# Patient Record
Sex: Male | Born: 1935 | Race: White | Hispanic: No | Smoking: Former smoker
Health system: Southern US, Community
[De-identification: ages and names within clinical notes are randomized; demographics above are authoritative.]

## PROBLEM LIST (undated history)

## (undated) DIAGNOSIS — Z951 Presence of aortocoronary bypass graft: Secondary | ICD-10-CM

## (undated) DIAGNOSIS — I82419 Acute embolism and thrombosis of unspecified femoral vein: Secondary | ICD-10-CM

## (undated) DIAGNOSIS — I200 Unstable angina: Secondary | ICD-10-CM

## (undated) DIAGNOSIS — E785 Hyperlipidemia, unspecified: Secondary | ICD-10-CM

## (undated) DIAGNOSIS — I2510 Atherosclerotic heart disease of native coronary artery without angina pectoris: Secondary | ICD-10-CM

## (undated) DIAGNOSIS — R9431 Abnormal electrocardiogram [ECG] [EKG]: Secondary | ICD-10-CM

## (undated) DIAGNOSIS — I10 Essential (primary) hypertension: Secondary | ICD-10-CM

## (undated) DIAGNOSIS — I4891 Unspecified atrial fibrillation: Secondary | ICD-10-CM

## (undated) DIAGNOSIS — I25110 Atherosclerotic heart disease of native coronary artery with unstable angina pectoris: Secondary | ICD-10-CM

## (undated) HISTORY — DX: Unstable angina: I20.0

## (undated) HISTORY — DX: Acute embolism and thrombosis of unspecified femoral vein: I82.419

## (undated) HISTORY — DX: Unspecified atrial fibrillation: I48.91

## (undated) HISTORY — DX: Atherosclerotic heart disease of native coronary artery with unstable angina pectoris: I25.110

## (undated) HISTORY — DX: Hyperlipidemia, unspecified: E78.5

## (undated) HISTORY — DX: Atherosclerotic heart disease of native coronary artery without angina pectoris: I25.10

## (undated) HISTORY — DX: Abnormal electrocardiogram (ECG) (EKG): R94.31

## (undated) HISTORY — DX: Essential (primary) hypertension: I10

## (undated) HISTORY — DX: Presence of aortocoronary bypass graft: Z95.1

---

## 1999-01-09 HISTORY — PX: CORONARY ARTERY BYPASS GRAFT: SHX141

## 1999-05-01 ENCOUNTER — Inpatient Hospital Stay (HOSPITAL_COMMUNITY): Admission: AD | Admit: 1999-05-01 | Discharge: 1999-05-11 | Payer: Self-pay | Admitting: *Deleted

## 1999-05-04 ENCOUNTER — Encounter: Payer: Self-pay | Admitting: Cardiothoracic Surgery

## 1999-05-05 ENCOUNTER — Encounter: Payer: Self-pay | Admitting: Cardiothoracic Surgery

## 1999-05-06 ENCOUNTER — Encounter: Payer: Self-pay | Admitting: Cardiothoracic Surgery

## 1999-05-07 ENCOUNTER — Encounter: Payer: Self-pay | Admitting: Thoracic Surgery (Cardiothoracic Vascular Surgery)

## 1999-05-09 ENCOUNTER — Encounter: Payer: Self-pay | Admitting: Cardiothoracic Surgery

## 1999-05-30 ENCOUNTER — Encounter (HOSPITAL_COMMUNITY): Admission: RE | Admit: 1999-05-30 | Discharge: 1999-08-28 | Payer: Self-pay | Admitting: *Deleted

## 2002-08-31 ENCOUNTER — Encounter: Admission: RE | Admit: 2002-08-31 | Discharge: 2002-08-31 | Payer: Self-pay | Admitting: Family Medicine

## 2002-08-31 ENCOUNTER — Encounter: Payer: Self-pay | Admitting: Family Medicine

## 2002-11-12 ENCOUNTER — Ambulatory Visit (HOSPITAL_COMMUNITY): Admission: RE | Admit: 2002-11-12 | Discharge: 2002-11-12 | Payer: Self-pay | Admitting: *Deleted

## 2002-11-12 ENCOUNTER — Inpatient Hospital Stay (HOSPITAL_COMMUNITY): Admission: EM | Admit: 2002-11-12 | Discharge: 2002-11-13 | Payer: Self-pay | Admitting: Emergency Medicine

## 2003-12-21 ENCOUNTER — Ambulatory Visit: Payer: Self-pay | Admitting: Cardiology

## 2003-12-29 ENCOUNTER — Ambulatory Visit: Payer: Self-pay | Admitting: *Deleted

## 2004-04-25 ENCOUNTER — Emergency Department (HOSPITAL_COMMUNITY): Admission: EM | Admit: 2004-04-25 | Discharge: 2004-04-25 | Payer: Self-pay | Admitting: Emergency Medicine

## 2004-05-03 ENCOUNTER — Ambulatory Visit: Payer: Self-pay | Admitting: *Deleted

## 2004-07-17 ENCOUNTER — Ambulatory Visit: Payer: Self-pay | Admitting: *Deleted

## 2004-07-26 ENCOUNTER — Ambulatory Visit: Payer: Self-pay | Admitting: Cardiology

## 2004-08-30 ENCOUNTER — Ambulatory Visit: Payer: Self-pay | Admitting: *Deleted

## 2005-03-02 ENCOUNTER — Ambulatory Visit: Payer: Self-pay | Admitting: *Deleted

## 2005-03-08 ENCOUNTER — Ambulatory Visit: Payer: Self-pay | Admitting: Cardiology

## 2005-04-04 ENCOUNTER — Ambulatory Visit: Payer: Self-pay | Admitting: *Deleted

## 2005-04-12 ENCOUNTER — Ambulatory Visit: Payer: Self-pay | Admitting: *Deleted

## 2005-04-19 ENCOUNTER — Ambulatory Visit: Payer: Self-pay | Admitting: Cardiology

## 2005-05-02 ENCOUNTER — Ambulatory Visit: Payer: Self-pay | Admitting: *Deleted

## 2005-11-14 ENCOUNTER — Ambulatory Visit: Payer: Self-pay | Admitting: *Deleted

## 2005-11-15 ENCOUNTER — Ambulatory Visit: Payer: Self-pay | Admitting: *Deleted

## 2005-11-15 LAB — CONVERTED CEMR LAB
ALT: 23 units/L (ref 0–40)
AST: 24 units/L (ref 0–37)
Albumin: 3.8 g/dL (ref 3.5–5.2)
Alkaline Phosphatase: 46 units/L (ref 39–117)
BUN: 18 mg/dL (ref 6–23)
CO2: 29 meq/L (ref 19–32)
Calcium: 9.4 mg/dL (ref 8.4–10.5)
Chloride: 106 meq/L (ref 96–112)
Chol/HDL Ratio, serum: 3.5
Cholesterol: 119 mg/dL (ref 0–200)
Creatinine, Ser: 1.1 mg/dL (ref 0.4–1.5)
GFR calc non Af Amer: 70 mL/min
Glomerular Filtration Rate, Af Am: 84 mL/min/{1.73_m2}
Glucose, Bld: 97 mg/dL (ref 70–99)
HDL: 33.7 mg/dL — ABNORMAL LOW (ref >39.0)
LDL Cholesterol: 68 mg/dL (ref 0–99)
Potassium: 4.5 meq/L (ref 3.5–5.1)
Sodium: 141 meq/L (ref 135–145)
Total Bilirubin: 0.6 mg/dL (ref 0.3–1.2)
Total Protein: 6.9 g/dL (ref 6.0–8.3)
Triglyceride fasting, serum: 89 mg/dL (ref 0–149)
VLDL: 18 mg/dL (ref 0–40)

## 2006-03-11 ENCOUNTER — Ambulatory Visit: Payer: Self-pay | Admitting: Cardiovascular Disease

## 2006-03-11 LAB — CONVERTED CEMR LAB
ALT: 20 units/L (ref 0–40)
AST: 21 units/L (ref 0–37)
Albumin: 3.7 g/dL (ref 3.5–5.2)
Alkaline Phosphatase: 47 units/L (ref 39–117)
Bilirubin, Direct: 0.1 mg/dL (ref 0.0–0.3)
Magnesium: 1.8 mg/dL (ref 1.5–2.5)
Potassium: 4 meq/L (ref 3.5–5.1)
Total Bilirubin: 0.7 mg/dL (ref 0.3–1.2)
Total CK: 75 units/L (ref 7–195)
Total Protein: 6.6 g/dL (ref 6.0–8.3)

## 2006-05-16 ENCOUNTER — Ambulatory Visit: Payer: Self-pay | Admitting: *Deleted

## 2006-05-27 ENCOUNTER — Ambulatory Visit: Payer: Self-pay | Admitting: *Deleted

## 2006-05-27 LAB — CONVERTED CEMR LAB
BUN: 23 mg/dL (ref 6–23)
CO2: 29 meq/L (ref 19–32)
Calcium: 9 mg/dL (ref 8.4–10.5)
Chloride: 112 meq/L (ref 96–112)
Creatinine, Ser: 1 mg/dL (ref 0.4–1.5)
GFR calc Af Amer: 94 mL/min
GFR calc non Af Amer: 78 mL/min
Glucose, Bld: 96 mg/dL (ref 70–99)
Potassium: 4.6 meq/L (ref 3.5–5.1)
Sodium: 145 meq/L (ref 135–145)

## 2006-06-18 ENCOUNTER — Ambulatory Visit: Payer: Self-pay | Admitting: Cardiology

## 2006-06-18 LAB — CONVERTED CEMR LAB
ALT: 20 units/L (ref 0–40)
AST: 19 units/L (ref 0–37)
Albumin: 3.8 g/dL (ref 3.5–5.2)
Alkaline Phosphatase: 47 units/L (ref 39–117)
Bilirubin, Direct: 0.1 mg/dL (ref 0.0–0.3)
Cholesterol: 228 mg/dL (ref 0–200)
Direct LDL: 139.9 mg/dL
HDL: 35.5 mg/dL — ABNORMAL LOW (ref >39.0)
Total Bilirubin: 0.8 mg/dL (ref 0.3–1.2)
Total CHOL/HDL Ratio: 6.4
Total Protein: 7 g/dL (ref 6.0–8.3)
Triglycerides: 209 mg/dL (ref 0–149)
VLDL: 42 mg/dL — ABNORMAL HIGH (ref 0–40)

## 2006-09-05 ENCOUNTER — Ambulatory Visit: Payer: Self-pay | Admitting: Cardiovascular Disease

## 2006-12-02 ENCOUNTER — Ambulatory Visit: Payer: Self-pay | Admitting: Cardiovascular Disease

## 2006-12-02 LAB — CONVERTED CEMR LAB
ALT: 18 units/L (ref 0–53)
AST: 20 units/L (ref 0–37)
Albumin: 3.5 g/dL (ref 3.5–5.2)
Alkaline Phosphatase: 44 units/L (ref 39–117)
Bilirubin, Direct: 0.1 mg/dL (ref 0.0–0.3)
Cholesterol: 174 mg/dL (ref 0–200)
HDL: 30.2 mg/dL — ABNORMAL LOW (ref >39.0)
LDL Cholesterol: 105 mg/dL — ABNORMAL HIGH (ref 0–99)
Total Bilirubin: 0.9 mg/dL (ref 0.3–1.2)
Total CHOL/HDL Ratio: 5.8
Total Protein: 6.5 g/dL (ref 6.0–8.3)
Triglycerides: 196 mg/dL — ABNORMAL HIGH (ref 0–149)
VLDL: 39 mg/dL (ref 0–40)

## 2007-03-17 ENCOUNTER — Ambulatory Visit: Payer: Self-pay | Admitting: Cardiovascular Disease

## 2007-03-27 ENCOUNTER — Ambulatory Visit: Payer: Self-pay

## 2007-11-05 ENCOUNTER — Ambulatory Visit: Payer: Self-pay | Admitting: Cardiovascular Disease

## 2007-11-24 ENCOUNTER — Ambulatory Visit: Payer: Self-pay | Admitting: Cardiovascular Disease

## 2007-11-24 LAB — CONVERTED CEMR LAB
Cholesterol: 177 mg/dL (ref 0–200)
HDL: 29 mg/dL — ABNORMAL LOW (ref >39.0)
LDL Cholesterol: 117 mg/dL — ABNORMAL HIGH (ref 0–99)
Total CHOL/HDL Ratio: 6.1
Triglycerides: 153 mg/dL — ABNORMAL HIGH (ref 0–149)
VLDL: 31 mg/dL (ref 0–40)

## 2008-02-24 ENCOUNTER — Ambulatory Visit: Payer: Self-pay

## 2008-04-16 DIAGNOSIS — I25810 Atherosclerosis of coronary artery bypass graft(s) without angina pectoris: Secondary | ICD-10-CM

## 2008-04-16 DIAGNOSIS — I10 Essential (primary) hypertension: Secondary | ICD-10-CM | POA: Insufficient documentation

## 2008-04-16 DIAGNOSIS — Z8679 Personal history of other diseases of the circulatory system: Secondary | ICD-10-CM

## 2008-04-16 DIAGNOSIS — Z8672 Personal history of thrombophlebitis: Secondary | ICD-10-CM

## 2008-04-16 DIAGNOSIS — E785 Hyperlipidemia, unspecified: Secondary | ICD-10-CM | POA: Insufficient documentation

## 2008-04-16 DIAGNOSIS — I480 Paroxysmal atrial fibrillation: Secondary | ICD-10-CM | POA: Insufficient documentation

## 2008-04-16 DIAGNOSIS — I2510 Atherosclerotic heart disease of native coronary artery without angina pectoris: Secondary | ICD-10-CM | POA: Insufficient documentation

## 2008-04-16 HISTORY — DX: Essential (primary) hypertension: I10

## 2008-04-16 HISTORY — DX: Personal history of other diseases of the circulatory system: Z86.79

## 2008-04-16 HISTORY — DX: Hyperlipidemia, unspecified: E78.5

## 2008-04-16 HISTORY — DX: Personal history of thrombophlebitis: Z86.72

## 2008-04-19 ENCOUNTER — Encounter: Payer: Self-pay | Admitting: Cardiovascular Disease

## 2008-04-19 ENCOUNTER — Ambulatory Visit: Payer: Self-pay | Admitting: Cardiovascular Disease

## 2008-04-26 ENCOUNTER — Telehealth (INDEPENDENT_AMBULATORY_CARE_PROVIDER_SITE_OTHER): Payer: Self-pay | Admitting: *Deleted

## 2008-04-27 ENCOUNTER — Encounter: Payer: Self-pay | Admitting: Cardiology

## 2008-04-27 ENCOUNTER — Ambulatory Visit: Payer: Self-pay

## 2008-04-27 ENCOUNTER — Encounter: Payer: Self-pay | Admitting: Cardiovascular Disease

## 2008-11-24 ENCOUNTER — Ambulatory Visit: Payer: Self-pay | Admitting: Cardiovascular Disease

## 2008-11-26 ENCOUNTER — Ambulatory Visit: Payer: Self-pay | Admitting: Cardiovascular Disease

## 2008-11-29 LAB — CONVERTED CEMR LAB
ALT: 18 units/L (ref 0–53)
AST: 20 units/L (ref 0–37)
Albumin: 3.5 g/dL (ref 3.5–5.2)
Alkaline Phosphatase: 42 units/L (ref 39–117)
Bilirubin, Direct: 0.1 mg/dL (ref 0.0–0.3)
Cholesterol: 166 mg/dL (ref 0–200)
HDL: 33.2 mg/dL — ABNORMAL LOW (ref >39.00)
LDL Cholesterol: 101 mg/dL — ABNORMAL HIGH (ref 0–99)
Total Bilirubin: 0.7 mg/dL (ref 0.3–1.2)
Total CHOL/HDL Ratio: 5
Total Protein: 7 g/dL (ref 6.0–8.3)
Triglycerides: 160 mg/dL — ABNORMAL HIGH (ref 0.0–149.0)
VLDL: 32 mg/dL (ref 0.0–40.0)

## 2009-05-18 ENCOUNTER — Ambulatory Visit: Payer: Self-pay | Admitting: Cardiovascular Disease

## 2009-05-26 ENCOUNTER — Encounter (INDEPENDENT_AMBULATORY_CARE_PROVIDER_SITE_OTHER): Payer: Self-pay | Admitting: *Deleted

## 2009-09-07 ENCOUNTER — Telehealth: Payer: Self-pay | Admitting: Cardiovascular Disease

## 2010-01-30 ENCOUNTER — Telehealth (INDEPENDENT_AMBULATORY_CARE_PROVIDER_SITE_OTHER): Payer: Self-pay | Admitting: *Deleted

## 2010-02-07 NOTE — Progress Notes (Signed)
Summary: question about medication  Phone Note Call from Patient Call back at Home Phone [phone]   Caller: Patient Summary of Call: Pt have question about what medication he can take for pain  taht want have a reaction with his heart medication Initial call taken by: Judie Grieve,  September 07, 2009 2:33 PM  Follow-up for Phone Call        09/07/09--1440PM--Pt calling about what med can take for arthritic pain, as is going on trip with lots of walking--advised pt to take tylenol product--pt agrees--nt Follow-up by: Ledon Snare, RN,  September 07, 2009 2:51 PM

## 2010-02-07 NOTE — Assessment & Plan Note (Signed)
Summary: [facility]   Visit Type:  Follow-up Primary Provider:  Dr Foy Guadalajara  CC:  dizziness.  History of Present Illness: 77 year-old male with CAD s/p CABG 2001 presents for f/u. Exercise is limited by back problems. Chronic c/o dizziness, occurs with position change from sitting to supine and supine to sitting, but not with standing. No CP, dyspnea, orthopnea, PND, edema, or lightheadedness. Initial presention with CAD was fatigue/silent ischemia. He did not have CP. Now c/o 'tightness' around back/epigastric region, seems unrelated to exertion.  No other c/o today.  Current Medications (verified): 1)  Aspirin 81 Mg Tbec (Aspirin) .... Take One Tablet By Mouth Daily 2)  Toprol Xl 50 Mg Xr24h-Tab (Metoprolol Succinate) .... Take 1 Tablet By Mouth Once A Day 3)  Centrum Silver  Tabs (Multiple Vitamins-Minerals) .... Take 1 Tablet By Mouth Once A Day 4)  Zetia 10 Mg Tabs (Ezetimibe) .... Take One Tablet By Mouth Daily. 5)  Welchol 625 Mg Tabs (Colesevelam Hcl) .... Take 6 Tables Daily 6)  Lotrel 10-40 Mg Caps (Amlodipine Besy-Benazepril Hcl) .... Take 1 Capsule By Mouth Once A Day 7)  Coq10 200 Mg Caps (Coenzyme Q10) .... Take 1 Capsule By Mouth Two Times A Day 8)  Fish Oil 900mg  Cap .... Take 1 Capsule By Mouth Two Times A Day  Allergies: 1)  ! Iodine 2)  ! * Shrimp  Past History:  Past medical, surgical, family and social histories (including risk factors) reviewed, and no changes noted (except as noted below).  Past Medical History:    Reviewed history from 04/16/2008 and no changes required:    ATRIAL FIBRILLATION, HX OF (ICD-V12.59)-    HYPERTENSION (ICD-401.9)    CAD, ARTERY BYPASS GRAFT (ICD-414.04)x 5 with the left internal mamary to the left anterior descending coronary artery.    DYSLIPIDEMIA (ICD-272.4)    DEEP VENOUS THROMBOPHLEBITIS, HX OF (ICD-V12.52)  Past Surgical History:    Reviewed history from 04/16/2008 and no changes required:    Coronary artery bypass  surgery 2001  Family History:    Reviewed history from 04/16/2008 and no changes required:         Positive for coronary disease.  Social History:    Reviewed history from 04/16/2008 and no changes required:        He lives in [place] with his wife.  He does not smoke.        He drives about two hours a day which increases his risk for DVT.  He is        retired.  He is a Agricultural consultant at [employer].       Alcohol Use - no       Drug Use - no  Review of Systems       10 point ROS completed, positive for nocturia, otherwise neg except as per HPI.   Vital Signs:  Patient profile:   77 year old male Height:      69 inches Weight:      209 pounds BMI:     30.98 Pulse rate:   59 / minute Pulse rhythm:   regular Resp:     18 per minute BP sitting:   136 / 72  (left arm) Cuff size:   large  Vitals Entered By: Vikki Ports (April 19, 2008 9:09 AM)  Physical Exam  General:  Pt is alert and oriented, in no acute distress. HEENT: normal Neck: normal carotid upstrokes without bruits, JVP normal Lungs: CTA CV: RRR  without murmur or gallop Abd: soft, NT, positive BS, no bruit, no organomegaly Ext: no clubbing, cyanosis, or edema. peripheral pulses 2+ and equal Skin: warm and dry without rash    EKG  Procedure date:  04/19/2008  Findings:      Sinus brady, NSST-T changes, otherwise WNL  Impression & Recommendations:  Problem # 1:  CAD, ARTERY BYPASS GRAFT (ICD-414.04)  His updated medication list for this problem includes:    Aspirin 81 Mg Tbec (Aspirin) .[name] Kitchen... Take one tablet by mouth daily    Toprol Xl 50 Mg Xr24h-tab (Metoprolol succinate) .[name] Kitchen... Take 1 tablet by mouth once a day    Lotrel 10-40 Mg Caps (Amlodipine besy-benazepril hcl) .[name] Kitchen... Take 1 capsule by mouth once a day  Pt was asx at presentation. Now with tightness around back/sides. Question whether this is an ischemic equivalent as pt is now 10 years out from CABG. Will check exercise  myoview to r/o ischemia. Otherwise cont current med Rx.  Problem # 2:  HYPERTENSION (ICD-401.9)  His updated medication list for this problem includes:    Aspirin 81 Mg Tbec (Aspirin) .[name] Kitchen... Take one tablet by mouth daily    Toprol Xl 50 Mg Xr24h-tab (Metoprolol succinate) .[name] Kitchen... Take 1 tablet by mouth once a day    Lotrel 10-40 Mg Caps (Amlodipine besy-benazepril hcl) .[name] Kitchen... Take 1 capsule by mouth once a day  BP at goal on current regimen. No changes.  Problem # 3:  DYSLIPIDEMIA (ICD-272.4)  His updated medication list for this problem includes:    Zetia 10 Mg Tabs (Ezetimibe) .[name] Kitchen... Take one tablet by mouth daily.    Welchol 625 Mg Tabs (Colesevelam hcl) .[name] Kitchen... Take 6 tables daily  Statin intolerant. Continue current Rx.  Patient Instructions: 1)  Your physician recommends that you continue on your current medications as directed. Please refer to the Current Medication list given to you today. 2)  Your physician wants you to follow-up in:  6 months. You will receive a reminder letter in the mail two months in advance. If you don't receive a letter, please call our office to schedule the follow-up appointment. 3)  Your physician has requested that you have an exercise stress myoview.  For further information please visit [email or web address]  Please follow instruction sheet, as given.

## 2010-02-07 NOTE — Progress Notes (Signed)
Summary: Nuc pre procedure  Phone Note Outgoing Call Call back at [facility] Phone [phone]   Call placed by: Rea College, CMA,  April 26, 2008 3:22 PM Call placed to: Patient Summary of Call: Reviewed information on Myoview Information Sheet (see scanned document for further details).  Spoke with patient.        Nuclear Med Background Indications for Stress Test: Evaluation for Ischemia, Graft Patency, PTCA Patency   History: CABG, Heart Catheterization, Myocardial Perfusion Study  History Comments: '01 CABG x 5;'09 [id number] scar,no ischemia,EF=68%;post op AFIB  Symptoms: Chest Pressure with Exertion, Dizziness, Fatigue    Nuclear Pre-Procedure Cardiac Risk Factors: Family History - CAD, History of Smoking, Hypertension, Lipids, TIA Height (in): 69

## 2010-02-07 NOTE — Assessment & Plan Note (Signed)
Summary: 6 MO F/U   Primary Provider:  Dr Foy Guadalajara  CC:  no complaints.  History of Present Illness: 77 year-old male with CAD s/p CABG 2001 presents for follow up evaluation. He complained of chest and abdominal tightness at his last evaluation and underwent a Myoview scan that showed no significant ischemia. He has done very well in the interim. Previously was limited by low back pain, but this has improved - thinks glucosamine/chondroitin has helped. The patient denies chest pain, dyspnea, orthopnea, PND, edema, palpitations, lightheadedness, or syncope.    He is statin intolerant because of myalgias/cramps.   Current Medications (verified): 1)  Aspirin 81 Mg Tbec (Aspirin) .... Take One Tablet By Mouth Daily 2)  Toprol Xl 50 Mg Xr24h-Tab (Metoprolol Succinate) .... Take 1 Tablet By Mouth Once A Day 3)  Centrum Silver  Tabs (Multiple Vitamins-Minerals) .... Take 1 Tablet By Mouth Once A Day 4)  Zetia 10 Mg Tabs (Ezetimibe) .... Take One Tablet By Mouth Daily. 5)  Welchol 625 Mg Tabs (Colesevelam Hcl) .... Take 6 Tables Daily 6)  Lotrel 10-40 Mg Caps (Amlodipine Besy-Benazepril Hcl) .... Take 1 Capsule By Mouth Once A Day 7)  Coq10 200 Mg Caps (Coenzyme Q10) .... Take 1 Capsule By Mouth Two Times A Day 8)  Fish Oil 900mg  Cap .... Take 1 Capsule By Mouth Two Times A Day 9)  Glucosamine-Chondroitin   Caps (Glucosamine-Chondroit-Vit C-Mn) .... Two Times A Day  Allergies (verified): 1)  ! Iodine 2)  ! * Int. Statins 3)  ! * Shrimp  Past History:  Past medical history reviewed for relevance to current acute and chronic problems.  Past Medical History: Reviewed history from 04/16/2008 and no changes required. ATRIAL FIBRILLATION, HX OF (ICD-V12.59)- HYPERTENSION (ICD-401.9) CAD, ARTERY BYPASS GRAFT (ICD-414.04)x 5 with the left internal mamary to the left anterior descending coronary artery. DYSLIPIDEMIA (ICD-272.4) DEEP VENOUS THROMBOPHLEBITIS, HX OF (ICD-V12.52)  Review of  Systems       Negative except as per HPI   Vital Signs:  Patient profile:   77 year old male Height:      69 inches Weight:      207 pounds BMI:     30.68 Pulse rate:   59 / minute Resp:     16 per minute BP sitting:   142 / 70  (left arm) Cuff size:   regular  Vitals Entered By: Hardin Negus, RMA (November 24, 2008 3:21 PM)  Physical Exam  General:  Pt is alert and oriented, in no acute distress. HEENT: normal Neck: normal carotid upstrokes without bruits, JVP normal Lungs: CTA CV: RRR without murmur or gallop Abd: soft, NT, positive BS, no bruit, no organomegaly Ext: no clubbing, cyanosis, or edema. peripheral pulses 2+ and equal Skin: warm and dry without rash    EKG  Procedure date:  11/24/2008  Findings:      NSR, nonspecific T wave abnormality, otherwise WNL.  Impression & Recommendations:  Problem # 1:  CAD, ARTERY BYPASS GRAFT (ICD-414.04) Pt stable without angina. Myoview from April 2010 reviewed - anterolateral infarct and mild peri-infarct ischemia noted. With small area of ischemia, I opted for medical therapy and the patient has been asymptomatic. Will continue with this strategy.  His updated medication list for this problem includes:    Aspirin 81 Mg Tbec (Aspirin) .[name] Kitchen... Take one tablet by mouth daily    Toprol Xl 50 Mg Xr24h-tab (Metoprolol succinate) .[name] Kitchen... Take 1 tablet by mouth once a day  Lotrel 10-40 Mg Caps (Amlodipine besy-benazepril hcl) .[name] Kitchen... Take 1 capsule by mouth once a day  Orders: EKG w/ Interpretation (93000)  Problem # 2:  HYPERTENSION (ICD-401.9) Assessment: Unchanged Cont. current regimen.  His updated medication list for this problem includes:    Aspirin 81 Mg Tbec (Aspirin) .[name] Kitchen... Take one tablet by mouth daily    Toprol Xl 50 Mg Xr24h-tab (Metoprolol succinate) .[name] Kitchen... Take 1 tablet by mouth once a day    Lotrel 10-40 Mg Caps (Amlodipine besy-benazepril hcl) .[name] Kitchen... Take 1 capsule by mouth once a day  Orders: EKG w/  Interpretation (93000)  BP today: 142/70 Prior BP: 136/72 (04/19/2008)  Labs Reviewed: K+: 4.6 (05/27/2006) Creat: : 1.0 (05/27/2006)   Chol: 177 (11/24/2007)   HDL: 29.0 (11/24/2007)   LDL: 117 (11/24/2007)   TG: 153 (11/24/2007)  Problem # 3:  DYSLIPIDEMIA (ICD-272.4) Statin intolerant. Followed by Dr Foy Guadalajara.  His updated medication list for this problem includes:    Zetia 10 Mg Tabs (Ezetimibe) .[name] Kitchen... Take one tablet by mouth daily.    Welchol 625 Mg Tabs (Colesevelam hcl) .[name] Kitchen... Take 6 tables daily  Patient Instructions: 1)  Your physician recommends that you return for a FASTING LIPID and LIVER PROFILE (414.04, 272.0) --Friday 11/26/08 2)  Your physician wants you to follow-up in:  6 MONTHS.  You will receive a reminder letter in the mail two months in advance. If you don't receive a letter, please call our office to schedule the follow-up appointment. Prescriptions: LOTREL 10-40 MG CAPS (AMLODIPINE BESY-BENAZEPRIL HCL) Take 1 capsule by mouth once a day  #90 x 4   Entered by:   Julieta Gutting, RN, BSN   Authorized by:   Norva Karvonen, MD   Signed by:   Julieta Gutting, RN, BSN on 11/24/2008   Method used:   Electronically to        [facility]* (mail-order)             ,          Ph: [phone]       Fax: [phone]   RxID:   [id number] WELCHOL 625 MG TABS (COLESEVELAM HCL) take 6 tables daily  #540 x 4   Entered by:   Julieta Gutting, RN, BSN   Authorized by:   Norva Karvonen, MD   Signed by:   Julieta Gutting, RN, BSN on 11/24/2008   Method used:   Electronically to        [facility]* (mail-order)             ,          Ph: [phone]       Fax: [phone]   RxID:   [id number] ZETIA 10 MG TABS (EZETIMIBE) Take one tablet by mouth daily.  #90 x 4   Entered by:   Julieta Gutting, RN, BSN   Authorized by:   Norva Karvonen, MD   Signed by:   Julieta Gutting, RN, BSN on 11/24/2008   Method used:   Electronically to        [facility]*  (mail-order)             ,          Ph: [phone]       Fax: [phone]   RxID:   [id number] TOPROL XL 50 MG XR24H-TAB (METOPROLOL SUCCINATE) Take 1 tablet by mouth once a day  #90 x 4   Entered by:   Julieta Gutting, RN,  BSN   Authorized by:   Norva Karvonen, MD   Signed by:   Julieta Gutting, RN, BSN on 11/24/2008   Method used:   Electronically to        [facility]* (mail-order)             ,          Ph: [phone]       Fax: [phone]   RxID:   [id number]

## 2010-02-07 NOTE — Assessment & Plan Note (Signed)
Summary: Cardiology Nuclear Testing  Nuclear Med Background Indications for Stress Test: Evaluation for Ischemia, Graft Patency   History: CABG, Heart Catheterization, Myocardial Perfusion Study  History Comments: '01 CABG x 5;'09 [id number] scar,no ischemia,EF=68%;post op AFIB;H/O DVT  Symptoms: Chest Tightness, Dizziness, DOE, Fatigue    Nuclear Pre-Procedure Cardiac Risk Factors: Family History - CAD, History of Smoking, Hypertension, Lipids, Obesity, TIA Caffeine/Decaff Intake: none NPO After: 7:00 PM Lungs: clear IV 0.9% NS with Angio Cath: 22g     IV Site: (R) hand IV Started by: Irean Hong RN Chest Size (in) 44     Height (in): 69 Weight (lb): 205 BMI: 30.38  Nuclear Med Study 1 or 2 day study:  1 day     Stress Test Type:  Stress Reading MD:  Marca Ancona, MD     Referring MD:  Tonny Bollman, MD Resting Radionuclide:  Technetium 60m Tetrofosmin     Resting Radionuclide Dose:  8.0 mCi  Stress Radionuclide:  Technetium 75m Tetrofosmin     Stress Radionuclide Dose:  27.6 mCi   Stress Protocol Exercise Time (min):  4:33 min     Max HR:  148 bpm     Predicted Max HR: 145 bpm  Max Systolic BP: 213 mm Hg     % Max HR:  102 %     METS: 6.4 Rate Pressure Product:  78295    Stress Test Technologist:  Rea College CMA-N     Nuclear Technologist:  Domenic Polite CNMT  Rest Procedure  Myocardial perfusion imaging was performed at rest 45 minutes following the intraveneous administration of Myoview Technetium 48m Tetrofosmin.  Stress Procedure  The patient exercised for 4:33.  The patient stopped due to a hypertensive response with peak BP of 213/79.  The patient had held his Toprol x 48 hours prior to stress test.  He denied any chest pain.  The EKG was nondiagnostic due to baseline changes.  Myoview was injected at peak exercise and myocardial perfusion imaging was performed after a brief delay.  QPS Raw Data Images:  Normal; no motion artifact; normal  heart/lung ratio. Stress Images:  Moderate anterolateral and mid anterior defect.  Rest Images:  Moderate anterolateral defect.  Subtraction (SDS):  Appears consistent with prior MI + a degree of peri-infarct ischemia Transient Ischemic Dilatation:  .91  (Normal <1.22)  Lung/Heart Ratio:  .37  (Normal <0.45)  Quantitative Gated Spect Images QGS EDV:  87 ml QGS ESV:  30 ml QGS EF:  65 % QGS cine images:  No major wall motion abnormalities.    Overall Impression  Exercise Capacity: Poor exercise capacity. BP Response: Hypertensive blood pressure response. Clinical Symptoms: Test stopped due to hypertension.  No chest pain.  ECG Impression: Baseline 1/2 mm ST depression V2-V4.  With exercise, there was an additional 1 mm horizontal ST depression in these leads.  This is a positive ECG response.  Overall Impression: Abnormal stress nuclear study. Overall Impression Comments: Perfusion images consistent with anterolateral MI + a mild degree of peri-infarct ischemia.  Compared to 3/09 images, the anterolateral fixed defect is more pronounced.  Interestingly,  wall motion is not significantly affected.  Similar accentuation of ECG changes was seen with prior study.   Appended Document: Cardiology Nuclear Testing Reviewed. Continue medical therapy.

## 2010-02-07 NOTE — Letter (Signed)
Summary: Colonoscopy Letter  [facility]  [address] [place], [place] [address]   Phone: [phone]  Fax: [phone]      May 26, 2009 MRN: [id number]   GJON LETARTE [address] [place], [place]  [address]   Dear Mr. HYMON,   According to your medical record, it is time for you to schedule a Colonoscopy. The American Cancer Society recommends this procedure as a method to detect early colon cancer. Patients with a family history of colon cancer, or a personal history of colon polyps or inflammatory bowel disease are at increased risk.  This letter has beeen generated based on the recommendations made at the time of your procedure. If you feel that in your particular situation this may no longer apply, please contact our office.  Please call our office at [phone] to schedule this appointment or to update your records at your earliest convenience.  Thank you for cooperating with [place] to provide you with the very best care possible.   Sincerely,  Barbette Hair. Arlyce Dice, M.D.  [facility] [phone]

## 2010-02-07 NOTE — Assessment & Plan Note (Signed)
Summary: [name]   Visit Type:  Follow-up Primary Provider:  Dr Foy Guadalajara  CC:  no complaints.  History of Present Illness: 77 year-old male with CAD s/p CABG 2001 presents for follow up evaluation. He had an exercise myoview scan one year ago demonstrating an anterolateral infarct with mild peri-infarct ischemia, overall low risk and similar to prior study. He has done well in the interim and has no complaints today. The patient denies chest pain, dyspnea, orthopnea, PND, edema, palpitations, lightheadedness, or syncope.   Current Medications (verified): 1)  Aspirin 81 Mg Tbec (Aspirin) .... Take One Tablet By Mouth Daily 2)  Toprol Xl 50 Mg Xr24h-Tab (Metoprolol Succinate) .... Take 1 Tablet By Mouth Once A Day 3)  Centrum Silver  Tabs (Multiple Vitamins-Minerals) .... Take 1 Tablet By Mouth Once A Day 4)  Zetia 10 Mg Tabs (Ezetimibe) .... Take One Tablet By Mouth Daily. 5)  Welchol 625 Mg Tabs (Colesevelam Hcl) .... Take 6 Tables Daily 6)  Lotrel 10-40 Mg Caps (Amlodipine Besy-Benazepril Hcl) .... Take 1 Capsule By Mouth Once A Day 7)  Coq10 200 Mg Caps (Coenzyme Q10) .... Take 1 Capsule By Mouth Two Times A Day 8)  Fish Oil 900mg  Cap .... Take 1 Capsule By Mouth Two Times A Day 9)  Glucosamine-Chondroitin   Caps (Glucosamine-Chondroit-Vit C-Mn) .... Two Times A Day  Allergies (verified): 1)  ! Iodine 2)  ! * Int. Statins 3)  ! * Shrimp  Past History:  Past medical history reviewed for relevance to current acute and chronic problems.  Past Medical History: Reviewed history from 04/16/2008 and no changes required. ATRIAL FIBRILLATION, HX OF (ICD-V12.59)- HYPERTENSION (ICD-401.9) CAD, ARTERY BYPASS GRAFT (ICD-414.04)x 5 with the left internal mamary to the left anterior descending coronary artery. DYSLIPIDEMIA (ICD-272.4) DEEP VENOUS THROMBOPHLEBITIS, HX OF (ICD-V12.52)  Review of Systems       Negative except as per HPI   Vital Signs:  Patient profile:   77 year old  male Height:      69 inches Weight:      209 pounds BMI:     30.98 Pulse rate:   61 / minute Resp:     16 per minute BP sitting:   128 / 64  (left arm) Cuff size:   regular  Vitals Entered By: Hardin Negus, RMA (May 18, 2009 11:39 AM)  Physical Exam  General:  Pt is alert and oriented, in no acute distress. HEENT: normal Neck: normal carotid upstrokes without bruits, JVP normal Lungs: CTA CV: RRR without murmur or gallop Abd: soft, NT, positive BS, no bruit, no organomegaly Ext: no clubbing, cyanosis, or edema. peripheral pulses 2+ and equal Skin: warm and dry without rash    EKG  Procedure date:  05/18/2009  Findings:      NSR with HR 61 bpm, nonspecific ST-T wave abnormality.  Impression & Recommendations:  Problem # 1:  CAD, ARTERY BYPASS GRAFT (ICD-414.04) Stable without angina. Myoview results reviewed from last year. Continue current medical therapy.  His updated medication list for this problem includes:    Aspirin 81 Mg Tbec (Aspirin) .[name] Kitchen... Take one tablet by mouth daily    Toprol Xl 50 Mg Xr24h-tab (Metoprolol succinate) .[name] Kitchen... Take 1 tablet by mouth once a day    Lotrel 10-40 Mg Caps (Amlodipine besy-benazepril hcl) .[name] Kitchen... Take 1 capsule by mouth once a day  Orders: EKG w/ Interpretation (93000)  Problem # 2:  HYPERTENSION (ICD-401.9) BP well-controlled on current Rx.  His updated medication list  for this problem includes:    Aspirin 81 Mg Tbec (Aspirin) .[name] Kitchen... Take one tablet by mouth daily    Toprol Xl 50 Mg Xr24h-tab (Metoprolol succinate) .[name] Kitchen... Take 1 tablet by mouth once a day    Lotrel 10-40 Mg Caps (Amlodipine besy-benazepril hcl) .[name] Kitchen... Take 1 capsule by mouth once a day  Orders: EKG w/ Interpretation (93000)  BP today: 128/64 Prior BP: 142/70 (11/24/2008)  Labs Reviewed: K+: 4.6 (05/27/2006) Creat: : 1.0 (05/27/2006)   Chol: 166 (11/26/2008)   HDL: 33.20 (11/26/2008)   LDL: 101 (11/26/2008)   TG: 160.0 (11/26/2008)  Problem # 3:   DYSLIPIDEMIA (ICD-272.4) Pt statin-intolerant. LDL not bad considering no statin. Continue welchol/zetia.  His updated medication list for this problem includes:    Zetia 10 Mg Tabs (Ezetimibe) .[name] Kitchen... Take one tablet by mouth daily.    Welchol 625 Mg Tabs (Colesevelam hcl) .[name] Kitchen... Take 6 tables daily  Orders: EKG w/ Interpretation (93000)  CHOL: 166 (11/26/2008)   LDL: 101 (11/26/2008)   HDL: 33.20 (11/26/2008)   TG: 160.0 (11/26/2008)  Patient Instructions: 1)  Your physician recommends that you continue on your current medications as directed. Please refer to the Current Medication list given to you today. 2)  Your physician wants you to follow-up in:  1 YEAR.  You will receive a reminder letter in the mail two months in advance. If you don't receive a letter, please call our office to schedule the follow-up appointment.

## 2010-02-09 NOTE — Progress Notes (Signed)
Summary: refill   Phone Note Refill Request Call back at Home Phone [phone] Message from:  Patient on January 30, 2010 1:19 PM  Refills Requested: Medication #1:  TOPROL XL 50 MG XR24H-TAB Take 1 tablet by mouth once a day [facility] pt would like a call when done  Initial call taken by: Judie Grieve,  January 30, 2010 1:19 PM  Follow-up for Phone Call        Rx faxed to pharmacy. Vikki Ports  January 31, 2010 2:42 PM       Prescriptions: TOPROL XL 50 MG XR24H-TAB (METOPROLOL SUCCINATE) Take 1 tablet by mouth once a day  #90 x 4   Entered by:   Vikki Ports   Authorized by:   Norva Karvonen, MD   Signed by:   Vikki Ports on 01/31/2010   Method used:   Faxed to ...       [facility] (mail[facility]order)       [address] [place], [place]  [address]       Ph: [phone]       Fax: [phone]   RxID:   [id number]

## 2010-04-03 ENCOUNTER — Telehealth: Payer: Self-pay | Admitting: Cardiovascular Disease

## 2010-04-03 DIAGNOSIS — E785 Hyperlipidemia, unspecified: Secondary | ICD-10-CM

## 2010-04-03 NOTE — Telephone Encounter (Signed)
Pt needs refill [facility] welchol 625mg  90 day supply, needs 30 day supply #180 takes 6 a day [facility]  Pt can be reached [phone]

## 2010-04-04 ENCOUNTER — Other Ambulatory Visit: Payer: Self-pay | Admitting: *Deleted

## 2010-04-04 DIAGNOSIS — E785 Hyperlipidemia, unspecified: Secondary | ICD-10-CM

## 2010-04-04 MED ORDER — COLESEVELAM HCL 625 MG PO TABS: 3750.0000 mg | ORAL_TABLET | Freq: Every day | ORAL | Status: DC

## 2010-04-04 MED ORDER — COLESEVELAM HCL 625 MG PO TABS
3750.0000 mg | ORAL_TABLET | Freq: Every day | ORAL | Status: DC
Start: 2010-04-04 — End: 2010-04-04

## 2010-04-04 NOTE — Telephone Encounter (Signed)
[facility]

## 2010-04-04 NOTE — Telephone Encounter (Signed)
One month supply of welchol refilled

## 2010-05-15 ENCOUNTER — Encounter: Payer: Self-pay | Admitting: *Deleted

## 2010-05-15 ENCOUNTER — Encounter: Payer: Self-pay | Admitting: Cardiovascular Disease

## 2010-05-16 ENCOUNTER — Ambulatory Visit (INDEPENDENT_AMBULATORY_CARE_PROVIDER_SITE_OTHER): Payer: Medicare Other | Admitting: Cardiovascular Disease

## 2010-05-16 ENCOUNTER — Encounter: Payer: Self-pay | Admitting: Cardiovascular Disease

## 2010-05-16 DIAGNOSIS — E785 Hyperlipidemia, unspecified: Secondary | ICD-10-CM

## 2010-05-16 DIAGNOSIS — E7800 Pure hypercholesterolemia, unspecified: Secondary | ICD-10-CM

## 2010-05-16 DIAGNOSIS — I25810 Atherosclerosis of coronary artery bypass graft(s) without angina pectoris: Secondary | ICD-10-CM

## 2010-05-16 DIAGNOSIS — I2510 Atherosclerotic heart disease of native coronary artery without angina pectoris: Secondary | ICD-10-CM

## 2010-05-16 DIAGNOSIS — I10 Essential (primary) hypertension: Secondary | ICD-10-CM

## 2010-05-16 NOTE — Patient Instructions (Signed)
Your physician recommends that you schedule a follow-up appointment in: 1 year with Dr. Excell Seltzer  Your physician recommends that you return for a FASTING lipid profile:

## 2010-05-16 NOTE — Progress Notes (Signed)
HPI:  This is a 77 year old gentleman presenting for followup evaluation. He has a history of coronary artery disease status post 5 vessel coronary bypass surgery in 2001.  The His last stress Myoview evaluation was in April 2010. This demonstrated anterolateral infarction with a mild degree of period for ischemia. The patient was treated medically without further evaluation and he has done well since that time. He describes one episode a few weeks ago where he felt a bandlike tightness around his chest. This occurred after playing golf on a very hot day. The episode resolved spontaneously. In the interim he has had no exertional symptoms. He specifically denies dyspnea, edema, palpitations, lightheadedness, diaphoresis, orthopnea, or PND. He has had no other episodes of chest pain.  Outpatient Encounter Prescriptions as of 05/16/2010  Medication Sig Dispense Refill  . amLODipine-benazepril (LOTREL) 10-40 MG per capsule Take 1 capsule by mouth daily.        [name] Kitchen aspirin 81 MG tablet Take 81 mg by mouth daily.        . colesevelam (WELCHOL) 625 MG tablet Take 6 tablets (3,750 mg total) by mouth daily. Take 6 tablets daily.  180 tablet  3  . ezetimibe (ZETIA) 10 MG tablet Take 10 mg by mouth daily.        [name] Kitchen glucosamine-chondroitin 500-400 MG tablet Take 1 tablet by mouth 2 (two) times daily.        . metoprolol (TOPROL-XL) 50 MG 24 hr tablet Take 50 mg by mouth daily.        . Multiple Vitamins-Minerals (CENTRUM SILVER PO) 1 tab po qd       . Omega-3 Fatty Acids (RA FISH OIL) 900 MG CAPS 1 tab po bid         Allergies  Allergen Reactions  . Iodine   . Statins     REACTION: Reaction not known    Past Medical History  Diagnosis Date  . Atrial fibrillation   . HTN (hypertension)   . CAD (coronary artery disease)      5 with the left internal mamary to the left anterior  descending coronary artery.  . Dyslipidemia   . DVT femoral (deep venous thrombosis) with thrombophlebitis     ROS: positive  for bilateral knee pain, otherwise negative except as per HPI  BP 118/68  Pulse 56  Ht 5\' 9"  (1.753 m)  Wt 204 lb (92.534 kg)  BMI 30.13 kg/m2  PHYSICAL EXAM: Pt is alert and oriented, NAD.  Appears younger than his stated age. HEENT: normal Neck: JVP - normal, carotids 2+= without bruits Lungs: CTA bilaterally CV: RRR without murmur or gallop Abd: soft, NT, Positive BS, no hepatomegaly Ext: no C/C/E, distal pulses intact and equal Skin: warm/dry no rash  EKG:  Sinus bradycardia with first degree AV block, nonspecific ST and T wave abnormality. Heart rate 56 beats per minute.  ASSESSMENT AND PLAN:

## 2010-05-16 NOTE — Assessment & Plan Note (Signed)
The patient is stable. He is at a single episode of bandlike tightness around his lower chest. He had a similar event a few years back that prompted his stress test. We will continue with his current medical program and I asked him to let us know immediately if he has recurrent symptoms.

## 2010-05-16 NOTE — Assessment & Plan Note (Signed)
The patient's blood pressure is well controlled on his current medical program. We will check a metabolic panel with his upcoming blood work.

## 2010-05-16 NOTE — Assessment & Plan Note (Signed)
The patient is statin intolerance secondary to myalgias. He is on multidrug therapy with WelChol, Zetia, and fish oil. He is due for lipids and LFTs.

## 2010-05-19 ENCOUNTER — Other Ambulatory Visit: Payer: Self-pay | Admitting: *Deleted

## 2010-05-19 ENCOUNTER — Other Ambulatory Visit (INDEPENDENT_AMBULATORY_CARE_PROVIDER_SITE_OTHER): Payer: Medicare Other

## 2010-05-19 DIAGNOSIS — I2510 Atherosclerotic heart disease of native coronary artery without angina pectoris: Secondary | ICD-10-CM

## 2010-05-19 DIAGNOSIS — E7800 Pure hypercholesterolemia, unspecified: Secondary | ICD-10-CM

## 2010-05-19 DIAGNOSIS — E785 Hyperlipidemia, unspecified: Secondary | ICD-10-CM

## 2010-05-19 LAB — HEPATIC FUNCTION PANEL
ALT: 20 U/L (ref 0–53)
AST: 17 U/L (ref 0–37)
Albumin: 3.3 g/dL — ABNORMAL LOW (ref 3.5–5.2)
Alkaline Phosphatase: 38 U/L — ABNORMAL LOW (ref 39–117)
Bilirubin, Direct: 0 mg/dL (ref 0.0–0.3)
Total Bilirubin: 0.6 mg/dL (ref 0.3–1.2)
Total Protein: 6.2 g/dL (ref 6.0–8.3)

## 2010-05-19 LAB — BASIC METABOLIC PANEL
BUN: 20 mg/dL (ref 6–23)
CO2: 26 mEq/L (ref 19–32)
Calcium: 8.8 mg/dL (ref 8.4–10.5)
Chloride: 112 mEq/L (ref 96–112)
Creatinine, Ser: 1.1 mg/dL (ref 0.4–1.5)
GFR: 68.84 mL/min (ref >60.00)
Glucose, Bld: 86 mg/dL (ref 70–99)
Potassium: 4.8 mEq/L (ref 3.5–5.1)
Sodium: 143 mEq/L (ref 135–145)

## 2010-05-19 LAB — LIPID PANEL
Cholesterol: 158 mg/dL (ref 0–200)
HDL: 36.1 mg/dL — ABNORMAL LOW (ref >39.00)
LDL Cholesterol: 88 mg/dL (ref 0–99)
Total CHOL/HDL Ratio: 4
Triglycerides: 168 mg/dL — ABNORMAL HIGH (ref 0.0–149.0)
VLDL: 33.6 mg/dL (ref 0.0–40.0)

## 2010-05-23 NOTE — Letter (Signed)
May 16, 2006    Luke [name]. Luke [name], M.D.  [address] [place], [place] [address]   RE:  Luke [name]  MRN:  [id number]  /  DOB:  02-04-1930   Dear Luke [name]:   It was a pleasure to see your nice patient, Luke [name] for follow up  on May 16, 2006.  He is a very pleasant 76 year old white married male  with coronary artery disease 7 years post CABG by Dr. Ofilia [name].  This  was performed as an emergency after a failed angioplasty of the diagonal  branch of the LAD, and there was a dissection.  The surgery included the  following:  Left internal mammary artery to LAD, sequential SVG to first  and second diagonals, sequential SVG to the second obtuse marginal and  posterior descending branches of the circumflex.  The patient has gotten  along well since that time.  Most recent stress test in 2005 revealed  small prior anterior mild infarct with no ischemia.  EF was 58%.   The patient continues to feel well, playing golf twice a week.  He has  been intolerant to Statins and has been followed in the Lipid Clinic by  Luke [name].   MEDICATIONS:  He is on aspirin 81, Co-Q 10 200 mg daily, Toprol XL 50,  Welchol three tablets daily, Centrum Silver   PHYSICAL EXAMINATION:  VITAL SIGNS:  Blood pressure 148/72, pulse 55,  sinus bradycardia.  GENERAL:  Normal.  NECK:  JVP is not elevated.  Carotid pulse palpable and equal without  bruits.  LUNGS:  Clear.  CARDIAC:  No murmur or gallop.  ABDOMEN:  Normal.  EXTREMITIES:  Normal.   STUDIES:  EKG reveals sinus bradycardia, mild amount of ST-T changes  that are unchanged.   DIAGNOSES:  As above.  The patient is getting along quite well 7 years  post CABG.   He does have moderate hypertension.  I have suggested he increase his  Lotrel 10/40.  He will also be followed in the lipid clinic and will see  Dr. Calton [name] for cardiology follow up in three months.   Thank you for the opportunity to care for this nice patient.    Sincerely,      E. Luke Congress, MD, [facility]  Electronically Signed    EJL/MedQ  DD: 05/16/2006  DT: 05/16/2006  Job #: [id number]

## 2010-05-23 NOTE — Assessment & Plan Note (Signed)
[facility]                            CARDIOLOGY OFFICE NOTE   Luke [name]                      MRN:          [id number]  DATE:11/05/2007                            DOB:          1930/01/20    HISTORY OF PRESENT ILLNESS:  Luke [name] was seen in followup at the  [facility] Cardiology Office November 05, 2007.  He is a 77 year old  gentleman with coronary artery disease and coronary bypass surgery in  2001.  He continues to do well from a symptomatic standpoint.  He plays  golf 2 days weekly and maintains an active lifestyle.  He has no chest  pain, dyspnea, edema, orthopnea, or PND.  He is limited somewhat by back  pain and radiates to his legs and has been diagnosed with spinal  stenosis.  His symptoms are not limiting enough at this point, but he  may consider surgery if things progress.   He underwent a stress Myoview scan in March of this year that  demonstrated anterolateral scar without ischemia.  His LV function is  preserved with a gated LVEF of 68%.   CURRENT MEDICATIONS:  1. Omega III fish oil 1200 mg t.i.d.  2. Coenzyme Q10 100 mg daily.  3. Aspirin 81 mg daily.  4. Toprol-XL 50 mg daily.  5. Centrum Silver daily.  6. Zetia 10 mg at bedtime.  7. Welchol 625 mg 6 tablets daily.  8. Lotrel 10/40 mg daily.   ALLERGIES:  NKDA.   PHYSICAL EXAMINATION:  GENERAL:  He is alert and oriented in no acute  distress.  VITAL SIGNS:  Weight is 206 pounds.  Blood pressure 140/70, heart rate  58, and respiratory rate 12.  HEENT:  Normal.  NECK:  Normal carotid upstrokes.  No bruits.  JVP normal.  LUNGS:  Clear bilaterally.  HEART:  Regular rate and rhythm.  No murmurs or gallops.  ABDOMEN:  Soft, nontender.  No organomegaly.  No abdominal bruits.  EXTREMITIES:  No clubbing, cyanosis, or edema.  Peripheral pulses intact  and equal.   EKG shows sinus bradycardia with nonspecific ST wave abnormality, and  the heart rate is 58 beats per  minute.   ASSESSMENT:  1. Coronary artery disease status post coronary artery bypass      grafting.  Nonischemic stress Myoview earlier this year.  Continue      current medical program without changes.  Encouraged as much      exercise as possible with known limitation from his back problem.  2. Hypertension.  He brings in home blood pressure readings that are      excellent.  His average systolic readings are approximately 120 and      diastolic readings are in the 60s.  Continue Lotrel and Toprol.  3. Dyslipidemia.  Lipids were checked in November last year and showed      cholesterol of 174 with triglycerides 196, HDL 30, and LDL 105.      This was done on a combination of Zetia and Welchol as he is unable      to tolerate any statins.  We will repeat lipids next month.   For followup, I would like to see [name] back in 6 months.     Veverly Fells. Excell Seltzer, MD  Electronically Signed    MDC/MedQ  DD: 11/05/2007  DT: 11/06/2007  Job #: [id number]   cc:   [name] L. Foy Gu[place]ara, M.D.

## 2010-05-23 NOTE — Assessment & Plan Note (Signed)
[facility]                            CARDIOLOGY OFFICE NOTE   LOVIS, MORE                      MRN:          [id number]  DATE:09/05/2006                            DOB:          01/17/1930    Luke [name] was seen in follow-up at the [facility] Cardiology office on  September 05, 2006.  He has been a long-time patient of Dr. Glennon Hamilton,  and I will be assuming his care from here forward.  Luke [name] is a  delightful 77 year old gentleman who has a history of coronary artery  disease and underwent coronary bypass surgery by Dr. Tyrone Sage  approximately 7 years ago.  He had multivessel surgery that involved the  LIMA to LAD, saphenous vein graft in sequence to the first and second  diagonal branch, and saphenous vein graft to the second OM and PDA  branches of the left circumflex.  His ejection fraction at the time of  his most recent stress nuclear study was 58%.  His main problem recently  has been statin intolerance.  He has tried every statin and has had  severe myalgias with all of them.  He is currently managed with Welchol.  His most recent lipid panel from June 10 showed a cholesterol of 228,  triglycerides of 209, HDL of 35, and direct LDL cholesterol of 140.   From a symptomatic standpoint he is doing well.  He has not been engaged  in regular exercise but does not have symptoms with his normal level of  activity.  He denies chest pain, dyspnea, orthopnea and PND,  lightheadedness or syncope.   His current medications include:  1. Aspirin 81 mg daily.  2. Coenzyme Q-10 200 mg daily.  3. Toprol XL 50 mg daily.  4. Welchol 625 mg three twice daily.  5. Centrum Silver daily.  6. Lotrel 10/40 mg daily.   On exam, he is alert and oriented, in no acute distress.  Weight is 207 pounds, blood pressure is 142/72, heart rate is reported  at 45 but on my exam was 64.  HEENT:  Normal.  NECK:  Normal carotid upstrokes without bruits.   Jugular venous pressure  is normal.  LUNGS:  Clear to auscultation bilaterally.  HEART:  Regular rate and rhythm without murmurs or gallops.  ABDOMEN:  Soft, nontender, no organomegaly, no bruits.  EXTREMITIES:  No clubbing, cyanosis, or edema.  Peripheral pulses are 2+  and equal throughout.   ASSESSMENT:  Luke [name] is currently stable from a cardiovascular  standpoint.  His cardiac problems are as follows:   1. Coronary artery disease, status post coronary bypass surgery.      Continue antiplatelet therapy with aspirin.  Continue beta blocker      therapy with Toprol XL.  For secondary risk reduction, see below.      Encouraged him to increase his activity level and begin regular      exercise.  2. Dyslipidemia.  Unfortunately, he is statin-intolerant.  He      previously has had excellent LDL levels when he was on a statin,  but his most recent LDL was 140.  I have started him on Zetia in      addition to [facility] today and will follow up lipids and LFTs in 12      weeks.  3. Hypertension.  Control is borderline but I did not make any changes      today.  He will continue on Lotrel and Toprol.  Will continue to      follow and if he has persistently elevated readings, we will add      therapy, likely with a thiazide diuretic.   For follow-up I will plan on seeing Luke [name] back in 6 months or  sooner if any new problems arise.     Veverly Fells. Excell Seltzer, MD  Electronically Signed    MDC/MedQ  DD: 09/05/2006  DT: 09/06/2006  Job #: [id number]   cc:   [name]. [name], M.D.

## 2010-05-23 NOTE — Assessment & Plan Note (Signed)
[facility]                            CARDIOLOGY OFFICE NOTE   AKSH, SWART                      MRN:          [id number]  DATE:03/17/2007                            DOB:          [birth year]    HISTORY OF PRESENT ILLNESS:  Luke [name] was seen in follow-up at  [facility] Cardiology office on March 17, 2007.  Luke [name] is a very nice  77 year old gentleman with history of CAD status post CABG approximately  7-8 years ago.  Luke [name] has been statin tolerant secondary to severe  myalgias.  He has been managed with a combination of Welchol and Zetia.  His most recent lipid panel from December 02, 2006, showed a total  cholesterol of 174.  This was down from 228.  His LDL was 105.  This was  decreased from 140.  His HDL was 30 which was decreased from 35.  [name] worked out in the yard yesterday and had a feeling of tightness  that felt like it wrapped around the back into the chest.  He thinks  this may have been due to muscle soreness as it was most significant  after he was finished with his activity.  He did not experience symptoms  during activity.  He has been relatively sedentary over the winter and  does not engage in any physical activity.  He has had no dyspnea,  orthopnea, PND or edema   CURRENT MEDICATIONS:  1. Aspirin 81 mg daily.  2. Coenzyme Q-10 200 mg daily.  3. Metoprolol XL 50 mg daily.  4. Centrum Silver daily.  5. Zetia 10 mg at bedtime.  6. Welchol 625 mg 6 tablets at night.  7. Lotrel 10/40 mg daily.   ALLERGIES:  None.   PHYSICAL EXAMINATION:  GENERAL:  The patient is alert and oriented.  No  acute distress.  VITAL SIGNS:  Weight 207, blood pressure 140/84, heart rate 64,  respiratory rate 16.  HEENT:  Normal.  NECK:  Normal carotid upstrokes without bruits.  Jugular venous pressure  is normal.  LUNGS:  Clear bilaterally.  HEART:  Regular rate and rhythm without murmurs or gallops.  ABDOMEN:  Soft,  obese, nontender, no organomegaly.  EXTREMITIES:  No clubbing, cyanosis or edema.  Peripheral pulses 2+ and  equal throughout.   ASSESSMENT:  1. CAD status post coronary artery bypass graft.  I am concerned about      Luke [name]'s symptoms from yesterday.  These occurred following      exertion and certainly could be related to muscle pain, but since      he is 7-8 years post CABG, I think we need to exclude progressive      native vessel or bypass graft disease.  He has been scheduled for      an exercise Myoview stress study.  Pending the results of his      stress study will determine further follow-up.  If this study is      normal, I will see him back in 6 months.  In the meantime, I would  like him to continue his same medications.  2. Dyslipidemia.  He is statin intolerant after trying multiple      statins.  Continue with Zetia and Welchol.  3. Hypertension.  His blood pressure remains mildly elevated.  He      insists that his home blood pressure is good.  I have asked him to      record some home blood pressure readings and bring them in on his      next office visit.  Will continue Lotrel and Toprol at present.   For follow-up I would like to see Luke [name] back in 6 months unless  there is significant defect on his Myoview study and then will need to  proceed with cardiac catheterization.  Will be in touch with him after  the results of this scan are available, otherwise follow-up in 6 months.     Veverly Fells. Excell Seltzer, MD  Electronically Signed    MDC/MedQ  DD: 03/17/2007  DT: 03/18/2007  Job #: [id number]

## 2010-05-26 NOTE — Op Note (Signed)
[place]  Patient:    Luke [name], Luke [name]                      MRN: [id number] Proc. Date: 05/04/99 Adm. Date:  [id number] Attending:  Waldo Laine CC:         Arturo Morton. Riley Kill, M.D.                           Operative Report  PREOPERATIVE DIAGNOSIS:  Failed angioplasty with ongoing ischemia.  POSTOPERATIVE DIAGNOSIS:  Failed angioplasty with ongoing ischemia.  SURGICAL PROCEDURE:  Emergency coronary artery bypass grafting x 5 with the left internal mammary to the left anterior descending coronary artery, sequential reverse saphenous vein graft to the first and second diagonal, sequential reverse saphenous vein graft to the second obtuse marginal, and posterior descending coronary artery arising from the circumflex.  SURGEON:  Gwenith Daily. Tyrone Sage, M.D.  FIRST ASSISTANT:  Carlye Grippe.  ANESTHESIA:  BRIEF HISTORY:  The patient is a 77 year old male who was undergoing angioplasty, planned to be of a subtotaled intermediate vessel with two branches, and then LAD of 80%.  In addition, the patient had a small, nondominant right system and a second obtuse marginal with 50-60%, and a posterior descending with 50-60%, and  overall preserved LV function.  With angioplasty attempt on the intermediate, both intermediate #1 and #2 thrombosed.  The vessels were calcified and further angioplasty attempts were abandoned and request for emergency surgery by the cardiologist was received.  The patient had an intra-aortic balloon pump in place and otherwise was hemodynamically stable.  Because of ongoing ischemia with high grade subtotal intermediate, high grade LAD, and diffuse disease, emergency coronary artery bypass grafting was recommended.  The patient agreed and signed informed consent.  DESCRIPTION OF PROCEDURE:  With Swan-Ganz and arterial line monitors in place, he patient underwent general endotracheal anesthesia without  incident.  The skin of the chest and legs was prepped with Betadine and draped in the usual sterile manner.  Initially, vein was harvested from the left lower extremity at the ankle. The skin was opened and a small saphenous vein was identified posteriorly and was not suitable for bypass.  For this reason, additional vein was harvested from the right lower extremity.  A median sternotomy was performed.  The left internal mammary artery was dissected down as a pedicle graft.  This artery was divided nd it had good free flow.  The patient was systemically heparinized.  The ascending aorta and the right atrium were cannulated.  Overall, the patient appeared to have preserved LV function.  He was placed on cardiopulmonary bypass at 2.4 L per minute per sq m.  Sites of anastomosis were selected and dissected out the epicardium s noted.  The patients right coronary artery was a small nondominant vessel.  The  posterior descending coronary artery was identified and the obtuse marginal was  identified.  Two intermediate vessels were intramyocardial and ultimately were identified after the patient was on bypass, dissecting out of the surrounding muscle.  The LAD was about a 0.5 mm vessel and easily identified on the anterior wall.  The patients body temperature was cooled to 30 degrees.  The aortic cross-clamp  was applied and 500 cc of cold blood potassium cardioplegia was administered with rapid diastolic arrest of the heart and myocardial septal temperature was monitored throughout the cross-clamp.  Attention was turned first to  the second obtuse marginal which was opened and using a segment of reverse saphenous vein graft, a diamond-type side-to-side anastomosis was carried out with a running 7-0 Prolene.  The distal extent of the same vein was then carried on to a small posterior descending branch which was supplied off the circumflex.  The vessel admitted a 1.5 mm probe and  using a running 7-0 Prolene, distal anastomosis was performed.  Attention was then turned to the first intermediate coronary artery which had been dissected out of the epicardium using a diamond-type side-to-side anastomosis which was carried out with a running 7-0 Prolene.  The distal extent of the same vein was then carried a short distance to the second intermediate vessel.  This was deeply intramyocardial and with some difficulty was finally identified and dissected out of the myocardium.  The vessel was opened and admitted a 1.5 mm probe distally.  Using a running 7-0 Prolene, the sequential portion of the vein was anastomosed to the second intermediate vessel.  Additional cold blood cardioplegia was administered down the vein graft.  Attention was then turned to the left anterior descending coronary artery which was opened and admitted a 1.5 mm probe distally.  Using a running 8-0 Prolene, the eft internal mammary artery was anastomosed to the left anterior descending coronary artery with release of the average bulldog on the mammary artery.  There was appropriate rise in myocardial and septal temperature.  The aortic cross-clamp as removed with a total cross-clamp time of 78 minutes.  The patient required electrical defibrillation to return to a sinus rhythm.  A partial occlusion clamp was placed.  Two punch aortotomies were performed. Each of the two vein grafts were anastomosed to the ascending aorta.  Air was evacuated from the graft.  The partial occlusion clamp was removed.  Sites of anastomosis  were inspected and were free of bleeding.  The patient was then ventilated, and  weaned from cardiopulmonary bypass on dopamine infusion, and remained hemodynamically stable.  He was decannulated in the usual fashion.  Protamine sulfate was administered.  With the operative field hemostatic, two atrial and two ventricular pacing wires were applied.  Graft markers  were applied.  A left pleural tube and two mediastinal tubes were left in place.  The sternum was closed with #6 stainless steel wire, the fascia closed with interrupted 0 Vicryl, running 3-0 Vicryl in the subcutaneous   tissue, 4-0 subcuticular stitch in the skin edges, and dry dressings were applied. Sponge and needle count was reported as correct at the completion of the procedure.  The patient tolerated the procedure without obvious complication and was transferred to the surgical intensive care unit for further postoperative care. DD:  05/08/99 TD:  05/09/99 Job: [id number]

## 2010-05-26 NOTE — H&P (Signed)
NAME:  Luke [name], Luke [name]                         ACCOUNT NO.:  [id number]   MEDICAL RECORD NO.:  [id number]                   PATIENT TYPE:  INP   LOCATION:  1844                                 FACILITY:  [facility]   PHYSICIAN:  Thomas C. Wall, M.D.                DATE OF BIRTH:  December 04, 1930   DATE OF ADMISSION:  11/12/2002  DATE OF DISCHARGE:                                HISTORY & PHYSICAL   CHIEF COMPLAINT:  Swollen left lower leg.   HISTORY OF PRESENT ILLNESS:  The patient is a delightful 77 year old white  male who has a history of coronary artery disease, status post coronary  bypass grafting in 2001.  He has a history of hypertension and dyslipidemia.   He was seen in the office today by Tereso Newcomer, P.A.-C with this complaint.  Ultrasound here today showed a left popliteal clot and deep vein thrombosis.  He is admitted now for intravenous heparin and may change over to Lovenox  and Coumadin as an outpatient.   PAST MEDICAL HISTORY:  He is intolerant of IODINE and SHELLFISH.   MEDICATIONS:  1. Aspirin 325 every other day.  2. Crestor 10 mg at bedtime.  3. Metoprolol 25 b.i.d.  4. Altace 10 mg daily.  5. Niaspan 1 g b.i.d.  6. Etodolac 500 b.i.d.   PAST MEDICAL HISTORY:  Significant for hyperlipidemia and hypertension.  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  He has a history of postoperative atrial fibrillation and colonic  polyps.   SOCIAL HISTORY:  He lives in [place] with his wife.  He does not smoke.  He drives about two hours a day which increases his risk for DVT.  He is  retired.  He is a Agricultural consultant at [employer].   FAMILY HISTORY:  Positive for coronary disease.   REVIEW OF SYSTEMS:  Unremarkable otherwise.   PHYSICAL EXAMINATION:  VITAL SIGNS:  Blood pressure 132/70, pulse 72 and  regular,  respiratory rate 18, temperature 97.9.  O2 saturation 99% on room  air.  GENERAL:  He is in no  acute distress.  SKIN:  Warm and dry.  HEENT:  Unremarkable.  NECK:  Shows no JVD.  Carotid upstrokes are equally bilaterally without  bruits.  There is no thyromegaly.  CARDIOVASCULAR:  Essentially normal.  LUNGS:  Clear to auscultation and percussion without rub.  ABDOMEN:  Soft, good bowel sounds.  EXTREMITIES:  No cyanosis or clubbing.  It does reveal 2+ edema in the left  lower extremity below the knee.  His calf is somewhat tight.  There are no  palpable cords and there are no signs of thrombophlebitis.   LABORATORY DATA:  Data is pending.   ASSESSMENT:  1. Left popliteal deep vein thrombosis.  2. Coronary artery disease, status post coronary artery bypass surgery,     currently  stable.  3. Normal left ventricular function.  4. Hypertension.  5. Hyperlipidemia.  6. 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  7. SHELLFISH allergy.   PLAN:  1. Admit.  2. Treat with intravenous heparin.  He will be on Coumadin tonight.  3. Will have pharmacy look at outpatient Lovenox.  If this is economical, it     is certainly equal treatment strategy.  I discussed this with the patient     and his wife, and they agree with this format.                                                Thomas C. Wall, M.D.    TCW/MEDQ  D:  11/12/2002  T:  11/12/2002  Job:  [id number]   cc:   Ellin Saba., M.D.  [address] [place]  [place] [address]  Fax: [phone]   E. Graceann Congress, M.D.

## 2010-05-26 NOTE — Assessment & Plan Note (Signed)
[facility]                               LIPID CLINIC NOTE   Luke [name], Luke [name]                      MRN:          [id number]  DATE:06/19/2006                            DOB:          Jan 14, 1930    The patient seen in Lipid Clinic for further evaluation and medication  titration.  States he has hyperlipidemia in the setting of documented  coronary artery disease.  He has been feeling and doing well overall.  He has had no chest pain, shortness of breath, or problems.  He has had  his exercise limited by continued back pain.  He is paying attention to  his diet, though he has not been as tightly controlled as he had been in  the past.   PAST MEDICAL HISTORY:  Pertinent for coronary artery disease status post  coronary artery bypass grafting.   CURRENT MEDICATIONS:  1. Aspirin  daily.  2. Lotrel 10/40 daily.  3. Metoprolol 50 mg XL daily.  4. Welchol 6 tablets daily.  5. Coenzyme Q-10 200 mg daily.  6. Multivitamin daily.   REVIEW OF SYSTEMS:  As stated in the HPI and otherwise negative.   PHYSICAL EXAM:  Blood pressure today is 148/72, heart rate is 68,  respirations 17.   LABS:  Revealed a total cholesterol that has not been checked in 3 to 4  months.   ASSESSMENT:  I will obtain the patient's labs and then make further  adjustments.   ADDENDUM:  These results have been obtained and discussed with the  patient at length.  The patient's LFTs are within normal limits.  LDL,  though not at goal, is approximately at 129 off Statin therapy due to  myalgias.  The patient will call with questions or problems in the  meantime.  He will continue his current therapy and work to tighten up  his diet.  I have suggested low sodium as well as low cholesterol.      Shelby Dubin, PharmD, BCPS, CPP  Electronically Signed      Rollene Rotunda, MD, [facility]  Electronically Signed   MP/MedQ  DD: 06/21/2006  DT: 06/22/2006  Job #: [id number]   cc:   Cecil Cranker, MD, [facility]

## 2010-05-26 NOTE — Discharge Summary (Signed)
[place]  Patient:    Luke [name], Luke [name]                      MRN: [id number] Adm. Date:  [id number] Disc. Date: [id number] Attending:  Waldo Laine Dictator:   Carlye Grippe. CC:         Gwenith Daily. Tyrone Sage, M.D.             Cecil Cranker, M.D. [facility]             Daisey Must, M.D. [facility]             Leroy Sea., M.D.                           Discharge Summary  HISTORY OF PRESENT ILLNESS:  This is a very pleasant retired 77 year old white male with a 15-year history of hypertension, hyperlipoproteinemia, and a family history of coronary artery disease who last October developed vague chest discomfort while playing golf.  He purposefully slowed down his activity level over the past six months.  For the last two days prior to admission, he began to have morning chest discomfort, which was felt to be more impressive by the patient and lasted in the range of 20 minutes before dissipation.  The patient had such an episode on the date of admission.  The patient saw Dr. Scotty Court a week ago, at which time his pain was more atypical than it was on the date of admission.  A Cardiolite was requested and this was done on May 01, 1999.  He was noted to have a regular study and EKG changes were positive for coronary insufficiency.  The patient was felt to require admission for heparinization and cardiac catheterization to better delineate his anatomy and function.  PAST MEDICAL HISTORY:  Essentially negative except for the above mentioned. He did also have a colonoscopy by Dr. Victorino Dike and was found to have a tiny polyp but otherwise was unremarkable.  FAMILY HISTORY:  Positive for coronary artery disease.  His father died from a heart attack at age 32.  REVIEW OF SYSTEMS:  Unremarkable.  ALLERGIES:  No known drug allergies.  MEDICATIONS ON ADMISSION: 1. Lozol 1.25 mg q.d. 2. Slow-K 1 q.d. 3. Voltaren 75 mg b.i.d.  p.r.n. 4. Prinivil q.d. 5. Ascriptin q.d.  PHYSICAL EXAMINATION:  Please see the history and physical done at the time of admission.  HOSPITAL COURSE:  The patient was admitted and started on intravenous heparin. He was taken to the cardiac catheterization lab on May 02, 1999 by Dr. Riley Kill.  He was found to have multiple vessel coronary artery disease and options were discussed with the patient and elective percutaneous intervention was felt to be indicated.  On May 04, 1999, the patient was taken to the cardiac catheterization lab, where he was to undergo angioplasty planned to be of a subtotal intermediate vessel with two branches and then LAD of 80%. Additional lesions on angiogram showed small, nondominant right system with a second obtuse marginal with a 50-60% stenosis, a posterior descending with a 50-60%, and otherwise preserved left ventricular function.  With angioplasty attempt to the intermediate, both intermediate #1 and #2 thrombosed.  The vessels were calcified and further angioplasty attempts were abandoned and request for emergency surgery were obtained per Dr. Sheliah Plane.  An intra-aortic balloon pump was placed and he remained hemodynamically stable and  was taken to the cardiac operating room, where he underwent the following procedure.  PROCEDURE:  Emergency coronary artery bypass grafting x 5 with the left internal mammary to the left anterior descending coronary artery, sequential reverse saphenous vein graft to the first and second diagonal, sequential reversed saphenous vein graft to the second obtuse marginal, and the posterior descending coronary artery arising from the circumflex.  The procedure was performed by Dr. Sheliah Plane, tolerated well, and he was taken to the surgical intensive care unit in stable condition.  POSTOPERATIVE HOSPITAL COURSE:  The patient has done well postoperatively.  He has maintained stable hemodynamics.  He did  require transfusion for postoperative anemia.  He was extubated without significant difficulties.  He was weaned from the intra-aortic balloon pump without difficulty.  He was started on a course of diuresis, which he has responded to well.  He did have an episode of paroxysmal atrial fibrillation and subsequently has been chemically cardioverted to normal sinus rhythm.  His exercise tolerance and rehabilitation has shown a good and gradual improvement.  Additionally, oxygen has been weaned and he maintains adequate saturations on room air.  His incisions are healing without signs of infection.  He is afebrile with stable hemodynamics.  Laboratory values are felt to be stable.  Most recent hemoglobin and hematocrit dated May 10, 1999 are 9.2 and 27.4 respectively. Electrolytes, BUN, and creatinine are all within normal limits.  Currently, the patient is feeling well and tentatively it is felt that he will be stable for discharge in the morning of May 11, 1999 pending morning round reevaluation.  DISCHARGE MEDICATIONS: 1. Altace 2.5 mg q.d. 2. Digoxin 0.125 mg q.d. 3. Lopressor 50 mg b.i.d. 4. Lipitor 20 mg daily. 5. Tylox 1-2 q.4-6h. p.r.n. 6. Enteric-coated aspirin 325 mg q.d.  FOLLOW-UP:  Dr. Tyrone Sage in three weeks and Dr. Corinda Gubler in two weeks. Additionally, staple removal will be done at the CVTS office on May 15, 1999 by the office nurse.  FINAL DIAGNOSES: 1. Coronary artery disease, multivessel, as described, with unstable angina on    admission. 2. Attempted percutaneous angioplasty. 3. Hypertension. 4. History of colon polyps. 5. Hyperlipoproteinemia. 6. Postoperative atrial fibrillation with conversion to normal sinus rhythm. 7. Postoperative anemia, now stable. DD:  05/12/99 TD:  05/12/99 Job: [id number]

## 2010-05-26 NOTE — Cardiovascular Report (Signed)
[place]  Patient:    CHRISTOPHER, GLASSCOCK                      MRN: [id number] Proc. Date: 05/02/99 Adm. Date:  [id number] Attending:  Alric Quan CC:         Cecil Cranker, M.D. [facility]             CV Laboratory             Leroy Sea., M.D.                        Cardiac Catheterization  INDICATIONS:  Mr. Fronczak is a pleasant 77 year old male who presents with findings of exercise-induced ischemia by exercise tolerance testing.  The current study was done to access coronary anatomy.  PROCEDURES:  Left heart catheterization, selective coronary angiography, selective left ventriculography, subclavian angiography.  DESCRIPTION OF PROCEDURE:  The procedure was performed from the right femoral artery using 6 French catheters.  He tolerated the procedure without complication.  HEMODYNAMICS:  The central aortic pressure was 156/81, LV pressure 146/23. There was no gradient on pullback across the aortic valve.  ANGIOGRAPHIC DATA:  VENTRICULOGRAPHY:  Ventriculography in the RAO projection reveals preserved global systolic function.  Ejection fraction is preserved at 85%.  No segmental abnormalities contraction were identified.  On plane fluoroscopy there was extensive calcification to the coronary tree, particularly the LAD and circumflex.  Of note, the right coronary artery was a nondominant vessel.  The left main coronary artery was free of critical disease.  The left anterior descending artery demonstrates about an 80% area of heavily calcified eccentric plaque just beyond the origin of the diagonal branch.  The LAD itself is suitable for percutaneous intervention.  However, rotational atherectomy may be required.  There is a diagonal branch that bifurcates distally.  Prior to the bifurcation there is a 90% heavily calcified eccentric stenosis.  This is a 2 mm vessel distally.  The circumflex itself is a dominant vessel.   Proximally there is about 30-40% area of plaquing followed by about 40% narrowing at the bifurcation of the first marginal branch.  This also extends down to involve the AV portion just beyond the marginal branch.  The marginal branch itself is a large bifurcating vessel that has about 40% to no more than 50% stenosis in its midportion.  The distal circumflex terminates as a posterolateral and posterior descending branch.  There is about 40% narrowing at this bifurcation as well.  The right coronary artery is nondominant with about 70% narrowing in the midportion of the AV branch.  The subclavian has a fair amount of calcification but the internal mammary is widely patent.  CONCLUSIONS: 1. Normal left ventricular function. 2. High-grade stenosis of the diagonal with moderately high-grade stenosis    of the left anterior descending. 3. Scattered disease of the circumflex coronary artery.  DISPOSITION:  The options at this point would include potential rotational atherectomy of the LAD diagonal bifurcation or possibly revascularization surgery.  I have discussed the options with my colleagues. DD:  05/02/99 TD:  05/02/99 Job: [id number]

## 2010-05-26 NOTE — Assessment & Plan Note (Signed)
[facility]                          CHRONIC HEART FAILURE NOTE   AVANTE, CARNEIRO                      MRN:          [id number]  DATE:03/11/2006                            DOB:          1930-07-14       Shelby Dubin, PharmD, BCPS, CPP  Electronically Signed      Rollene Rotunda, MD, [facility]  Electronically Signed   MP/MedQ  DD: 03/15/2006  DT: 03/15/2006  Job #: [id number]

## 2010-05-26 NOTE — Assessment & Plan Note (Signed)
[facility]                          CHRONIC HEART FAILURE NOTE   Luke [name], Luke [name]                      MRN:          [id number]  DATE:03/11/2006                            DOB:          [birth year]    The patient is very pleasant gentleman known to me in the lipid clinic.  He is a long-time patient of Dr. Glennon Hamilton.  He is seen back as he has  had significant leg pain and weakness in the past several weeks, and  discontinued his Vytorin in the past several weeks.  Luke [name] has had  no chest pain or shortness of breath.  He states that he does feel much  better now off of his Statin at this time.   PAST MEDICAL HISTORY:  Pertinent for documented coronary disease.   CURRENT MEDICATIONS:  1. Aspirin 81 mg daily.  2. Co-Q10 200 mg daily.  3. Lotrel 5/20 one tablet daily.  4. Toprol XL 50 mg daily.   REVIEW OF SYSTEMS:  As stated in the HPI.  Otherwise, negative.   PHYSICAL EXAM:  Weight today is 208 pounds, blood pressure 120/70, heart  rate is 64.   Most recent labs are not yet obtained.   ASSESSMENT:  The patient has had problems now on Vytorin.  We will  review his lab work, which will be drawn today, to verify that there are  no underlying causes for his very significant leg pain.  He will contact  me with questions or problems in the meantime.   Lab followup, labs obtained on March 3 include a potassium, which was  normal at 4.0.  Magnesium was normal at 1.8.  Liver function tests on  that day were within normal limits, and the muscle breakdown test, CK,  was normal at 75.  Therefore, based on this, the patient has agreed to  consider resumption of drug therapy.  In reviewing the various notes  over the past several years in the lipid clinic, we have determined that  the patient did take Crestor and did not have leg pain per se, but did  have trouble on Crestor alone.  As a result, I will try the patient on  Welchol 6 tablets  daily.  He should take 3 tablets twice a day.  I would  like to consider the use of Crestor singly as a single agent in the  future, as a potential more effective therapy, but will go  with the non-absorbed Welchol at this time.  The patient will call with  questions in the meantime.  The patient will follow up with labs in 4 to  6 weeks.      Shelby Dubin, PharmD, BCPS, CPP  Electronically Signed      Rollene Rotunda, MD, [facility]  Electronically Signed   MP/MedQ  DD: 03/15/2006  DT: 03/15/2006  Job #: [id number]   cc:   Cecil Cranker, MD, [facility]

## 2010-05-26 NOTE — Discharge Summary (Signed)
NAME:  Luke [name], Luke [name]                         ACCOUNT NO.:  [id number]   MEDICAL RECORD NO.:  [id number]                   PATIENT TYPE:  INP   LOCATION:  5523                                 FACILITY:  [facility]   PHYSICIAN:  Jesse Sans. Wall, M.D.                DATE OF BIRTH:  09/27/1930   DATE OF ADMISSION:  11/12/2002  DATE OF DISCHARGE:  11/13/2002                           DISCHARGE SUMMARY - REFERRING   DISCHARGE DIAGNOSIS:  Acute deep venous thrombosis after a long driving  trip, treated with intravenous heparin, in-hospitalization, outpatient as  Lovenox with crossover to Coumadin, a 48-hour overlap.   SECONDARY DIAGNOSES:  1. History of coronary artery disease, status post coronary artery bypass     graft surgery, 2001, after unsuccessful angioplasty of the first diagonal     branch due to occlusive dissection caused by guidewire, necessitating     emergent coronary artery bypass graft surgery.  Cardiac catheterization     on May 02, 1999 showed ejection fraction 85%, left main free of     disease, left anterior descending had 80% heavily calcified eccentric     plaque just beyond the origin of the diagonal.  Diagonal branch     bifurcates distally; there is a 90% stenosis just prior to the     bifurcation.  The left circumflex has a 30-40% area of plaquing     proximally, followed by a 40% narrowing at the bifurcation of the first     marginal branch.  The marginal branch itself is a large bifurcating     vessel and has 40% to no more than 50% stenosis at midpoint.  The distal     circumflex terminates as a posterolateral and posterior descending     branch.  There is a 40% narrowing at this bifurcation.  The right     coronary artery is nondominant with 70% narrowing in the midportion of     the atrioventricular branch.  In the coronary artery bypass procedure     which was done emergently, the left internal mammary artery was connected     in an end-to-side fashion to  the left anterior descending coronary     artery, reversed saphenous vein graft was fashioned sequentially from the     aorta to the first and second diagonal and a reversed saphenous vein     graft in a sequential fashion to the second obtuse marginal and to the     posterior descending coronary artery.  2. Past medical history of:     a. Hypertension.     b. Dyslipidemia.     c. Lumbar degenerative joint disease.     d. History of post coronary artery bypass graft surgery.     e. Atrial fibrillation.     f. History of colon polyps.   PROCEDURE -- THIS HOSPITALIZATION:  On November 12, 2002, ultrasound of the  lower  extremities showing acute deep venous thrombosis in the left popliteal  vein, coursing from proximal calf through the popliteal fossa, no evidence  of superficial thrombosis or Baker's cyst.   DISCHARGE DISPOSITION:  Luke [name], after being admitted with two weeks of  swelling of the left calf, which was taut, found to have a popliteal vein  deep venous thrombosis on ultrasound.  He has been maintained on IV heparin,  this hospitalization.  He will be switched to Lovenox, dosed by the  pharmacy, will go home on Lovenox/Coumadin therapy until Coumadin is  therapeutic and will be seen in the office at [facility] in  [place] to adjust Coumadin doses.   DISCHARGE MEDICATIONS:  He goes home on the following medications:  1. Lovenox -- dose to be adjusted by pharmacy.  2. Coumadin 5 mg daily -- taken on Saturday, November 14, 2002, and Sunday,     November 15, 2002.  3. Enteric-coated aspirin 81 mg daily.  4. Crestor 10 mg daily.  5. Metoprolol 50 mg tablets -- one-half tab in the morning and one-half tab     in the evening.  6. Altace 10 mg daily.  7. Niaspan 1 g twice daily.   SPECIAL DISCHARGE INSTRUCTIONS:  He is to hold off on taking all NSAIDs  including Lodine and Move Free.   PAIN MANAGEMENT:  Pain management is not applicable.   ACTIVITY:  He is to  walk daily to keep up his strength.   DISCHARGE DIET:  Low-sodium, low-cholesterol diet.   WOUND CARE:  Not applicable.   FOLLOWUP:  He has an office visit, Monday, November 16, 2002, Coumadin  Clinic, at 11:30 in the morning and will also see a physician assistant at  about that time.  He has an office visit with Dr. Cecil Cranker,  [facility] office of [facility], two weeks from now,  November 26, 2002, at 12:15.   BRIEF HISTORY:  Luke [name] is a 77 year old male.  He has a prior  history of coronary artery disease.  He underwent coronary artery bypass  graft surgery on an emergent basis in 2001 after a failed PCI attempt of the  first diagonal.  He has a history of hypertension and dyslipidemia and is  admitted now with acute left lower extremity DVT.  He was seen in the office  at [facility] with a two-week history of left calf  swelling.  He has no dyspnea on exertion, no shortness of breath, no chest  pain, referred for lower extremity Doppler studies revealing DVT in the  popliteal vein.  No computed tomogram of the chest had been taken.   HOSPITAL COURSE:  Hospital course as described in discharge disposition.   LABORATORIES -- THIS HOSPITALIZATION:  Complete blood count:  White cells  4.9, hemoglobin 12.5, hematocrit 35.7, platelets 128,000.  PT on November 13, 2002, 14.1; INR 1.1.  Serum electrolytes -- this admission:  Sodium 142,  potassium 3.8, chloride 108, bicarbonate 26, BUN 17, creatinine 1.0, glucose  132.   Once again, the patient has DVT of left popliteal vein, discharging today,  November 13, 2002, on Lovenox per pharmacy.      Maple Mirza, P.A.                    Thomas C. Wall, M.D.    GM/MEDQ  D:  11/13/2002  T:  11/14/2002  Job:  [id number]   cc:   Cecil Cranker,  M.D.   Ellin Saba., M.D.  [address]  [place] [address]  Fax: [phone]

## 2010-05-26 NOTE — Op Note (Signed)
[place]  Patient:    Luke [name], Luke [name]                      MRN: [id number] Proc. Date: 05/04/99 Adm. Date:  [id number] Attending:  Waldo Laine CC:         Leroy Sea., M.D.             Cecil Cranker, M.D. [facility]             Cath. Lab.                           Operative Report  PROCEDURE PERFORMED:  Attempt at percutaneous intervention of the first diagonal branch.  INDICATIONS FOR PROCEDURE:  [name] is a 77 year old male who has been having exertional angina.  A stress Cardiolite scan showed significant ischemia.  Cardiac catheterization two days ago by Dr. Riley Kill revealed the presence of heavy calcification in his coronaries.  He had a left dominant system.  There was a large first diagonal branch which bifurcated.  Proximal to the bifurcation, there was a 99% heavily calcified eccentric lesion.  There was also significant diffuse 80% stenosis in the mid-LAD, again a heavily calcified vessel.  The circumflex had moderate nonobstructive disease.  After review of the cines and extensive discussion of the options, it was opted to proceed with attempt at percutaneous intervention.  PROCEDURAL NOTE:  An #8 French sheath was placed in the right femoral artery, a #6 French sheath in the right femoral vein.  Heparin and Integrilin were administered per protocol.  We used an #8 [place] Q-4 guiding catheter with sideholes.  We initially attempted to cross the lesion with a Rotafloppy gold wire.  However, this met significance resistance at the lesion itself and would not cross.  We then attempted to cross with a Hi-Torque floppy wire. This also met considerable resistance and would not cross the lesion.  Of note, the patient developed significant chest pain during these initial attempts to cross the lesion and had ST depression on his EKG.  We then attempted to cross the lesion with a _______ PT wire.  This appeared to  go across the lesion, however, resulted in development of an occlusive dissection.  This dissection occluded flow into the superior branch of the diagonal.  There was still TIMI-2 flow into the inferior branch.  In spite of repeated attempts, we could not reposition the wire in the true lumen.  We therefore opted to proceed with emergent coronary artery bypass grafting.  The patient was treated with intravenous Lopressor and nitroglycerin.  An intra-aortic balloon pump was placed.  His chest pain decreased significantly. However, he was still having mild residual chest pain.  He was hemodynamic stable.  Dr. Tyrone Sage was consulted and has agreed to take the patient to emergent bypass surgery.  RESULTS:  Unsuccessful attempt at angioplasty in the first diagonal branch due to occlusive dissection caused by the guide wire.  PLAN:  The patient is taken to emergency bypass surgery. DD:  05/04/99 TD:  05/04/99 Job: [id number]

## 2010-05-26 NOTE — Letter (Signed)
November 14, 2005    Luke [name]. Luke [name], M.D.  [address]  [place], [place] [address]   RE:  Luke [name]  MRN:  [id number]  /  DOB:  03-Nov-1930   Dear Luke [name],   It was a pleasure to see your nice patient, Luke [name], for follow up on  November 14, 2005.  As you know, he is a very pleasant 77 year old white  married male with coronary artery disease, six years post coronary artery  bypass graft surgery.  He has history of hyperlipidemia, hypertension and  history of deep vein thrombosis.   The patient got along quite well playing golf twice a week and has no  cardiac symptoms.  He is bothered by chronic low back pain for which he sees  his chiropractor with some success.   MEDICATIONS:  1. Metoprolol 25 b.i.d.  2. Aspirin 81.  3. Vytorin 10/40.  4. Co-Q enzyme 10.  5. Lotrel 5/10.   Blood pressure 125/74, pulse 60, normal sinus rhythm.  General appearance is  normal.  JVP is not elevated.  Carotid pulses palpable without any type  bruits.  Lungs are clear.  Cardiac exam normal.  Abdominal exam normal.  Extremities normal with good pulses and no edema.   EKG reveals normal sinus rhythm, RSR prime, question of an old anterior  septal MI.   IMPRESSION:  Diagnosis as above.  The patient is stable.  Most recent stress  test was May 04, 2003.   We plan to continue the same therapy except change the metoprolol to 25  b.i.d. to Toprol XL 50 daily.  I will plan to see him back in six months.  We plan to get BMP, lipid and LFTs in the near future.  Thank you for the  opportunity to share in this nice gentleman's care.    Sincerely,     ______________________________  E. Graceann Congress, MD, [facility]    EJL/MedQ  DD: 11/14/2005  DT: 11/14/2005  Job #: [id number]

## 2010-08-07 ENCOUNTER — Telehealth: Payer: Self-pay | Admitting: *Deleted

## 2010-08-07 NOTE — Telephone Encounter (Signed)
Called pt to advise him he is over due for his colonoscopy and he tells me he had a colonoscopy in 2008 by accident. His wife made him an appt for one and he had it done he is unsure who it was with or when. I called [facility] and they state it was not with their office. I have advised the patient to call back when he knows who his colonoscopy was with and if he would like to come to our practice again he will need his records sent here so a MD here can decided when his recall is due. He will call back .

## 2010-11-04 ENCOUNTER — Other Ambulatory Visit: Payer: Self-pay | Admitting: Cardiovascular Disease

## 2011-01-10 DIAGNOSIS — M47817 Spondylosis without myelopathy or radiculopathy, lumbosacral region: Secondary | ICD-10-CM | POA: Diagnosis not present

## 2011-01-10 DIAGNOSIS — M545 Low back pain, unspecified: Secondary | ICD-10-CM | POA: Diagnosis not present

## 2011-01-10 DIAGNOSIS — M5137 Other intervertebral disc degeneration, lumbosacral region: Secondary | ICD-10-CM | POA: Diagnosis not present

## 2011-01-10 DIAGNOSIS — M999 Biomechanical lesion, unspecified: Secondary | ICD-10-CM | POA: Diagnosis not present

## 2011-01-12 ENCOUNTER — Telehealth: Payer: Self-pay | Admitting: Cardiovascular Disease

## 2011-01-12 MED ORDER — METOPROLOL SUCCINATE ER 50 MG PO TB24: 50.0000 mg | ORAL_TABLET | Freq: Every day | ORAL | Status: DC

## 2011-01-12 NOTE — Telephone Encounter (Signed)
New Problem:    Patient called in needing 90 day refills of his amLODipine-benazepril (LOTREL) 10-40 MG per capsule and metoprolol (TOPROL-XL) 50 MG 24 hr tablet.

## 2011-02-01 ENCOUNTER — Other Ambulatory Visit: Payer: Self-pay | Admitting: Cardiovascular Disease

## 2011-02-01 NOTE — Telephone Encounter (Signed)
Refill F/U  Patient f/u on prescription refill.  Can be reached at hm#

## 2011-02-01 NOTE — Telephone Encounter (Signed)
New msg Pt wants amlodipine 90 day supply [facility] [address] and [facility] is saying they did not receive it. Please call him back when done.

## 2011-02-02 ENCOUNTER — Other Ambulatory Visit: Payer: Self-pay

## 2011-02-02 MED ORDER — AMLODIPINE BESY-BENAZEPRIL HCL 10-40 MG PO CAPS: 1.0000 | ORAL_CAPSULE | Freq: Every day | ORAL | Status: DC

## 2011-02-14 DIAGNOSIS — M47817 Spondylosis without myelopathy or radiculopathy, lumbosacral region: Secondary | ICD-10-CM | POA: Diagnosis not present

## 2011-02-14 DIAGNOSIS — M545 Low back pain, unspecified: Secondary | ICD-10-CM | POA: Diagnosis not present

## 2011-02-14 DIAGNOSIS — M5137 Other intervertebral disc degeneration, lumbosacral region: Secondary | ICD-10-CM | POA: Diagnosis not present

## 2011-02-14 DIAGNOSIS — M999 Biomechanical lesion, unspecified: Secondary | ICD-10-CM | POA: Diagnosis not present

## 2011-02-28 DIAGNOSIS — M999 Biomechanical lesion, unspecified: Secondary | ICD-10-CM | POA: Diagnosis not present

## 2011-02-28 DIAGNOSIS — M47817 Spondylosis without myelopathy or radiculopathy, lumbosacral region: Secondary | ICD-10-CM | POA: Diagnosis not present

## 2011-02-28 DIAGNOSIS — M5137 Other intervertebral disc degeneration, lumbosacral region: Secondary | ICD-10-CM | POA: Diagnosis not present

## 2011-02-28 DIAGNOSIS — M545 Low back pain, unspecified: Secondary | ICD-10-CM | POA: Diagnosis not present

## 2011-03-14 DIAGNOSIS — M999 Biomechanical lesion, unspecified: Secondary | ICD-10-CM | POA: Diagnosis not present

## 2011-03-14 DIAGNOSIS — M47817 Spondylosis without myelopathy or radiculopathy, lumbosacral region: Secondary | ICD-10-CM | POA: Diagnosis not present

## 2011-03-14 DIAGNOSIS — M545 Low back pain, unspecified: Secondary | ICD-10-CM | POA: Diagnosis not present

## 2011-03-14 DIAGNOSIS — M5137 Other intervertebral disc degeneration, lumbosacral region: Secondary | ICD-10-CM | POA: Diagnosis not present

## 2011-03-26 ENCOUNTER — Telehealth: Payer: Self-pay | Admitting: Cardiovascular Disease

## 2011-03-26 DIAGNOSIS — E785 Hyperlipidemia, unspecified: Secondary | ICD-10-CM

## 2011-03-26 NOTE — Telephone Encounter (Signed)
zetia 10mg , amlodipine 10/40mg , metoprolol 50mg , welchol 625mg , requesting written rx mailed to him, to send to new mail order pharmacy for 90 day refills, with 3 refills

## 2011-03-28 MED ORDER — COLESEVELAM HCL 625 MG PO TABS
3750.0000 mg | ORAL_TABLET | Freq: Every day | ORAL | Status: DC
Start: 2011-03-28 — End: 2013-02-05

## 2011-03-28 MED ORDER — AMLODIPINE BESY-BENAZEPRIL HCL 10-40 MG PO CAPS: 1.0000 | ORAL_CAPSULE | Freq: Every day | ORAL | Status: DC

## 2011-03-28 MED ORDER — EZETIMIBE 10 MG PO TABS: 10.0000 mg | ORAL_TABLET | Freq: Every day | ORAL | Status: DC

## 2011-03-28 MED ORDER — METOPROLOL SUCCINATE ER 50 MG PO TB24: 50.0000 mg | ORAL_TABLET | Freq: Every day | ORAL | Status: DC

## 2011-03-28 NOTE — Telephone Encounter (Signed)
Prescriptions mailed to pt on 03/28/11.

## 2011-04-11 DIAGNOSIS — M545 Low back pain, unspecified: Secondary | ICD-10-CM | POA: Diagnosis not present

## 2011-04-11 DIAGNOSIS — M5137 Other intervertebral disc degeneration, lumbosacral region: Secondary | ICD-10-CM | POA: Diagnosis not present

## 2011-04-11 DIAGNOSIS — M47817 Spondylosis without myelopathy or radiculopathy, lumbosacral region: Secondary | ICD-10-CM | POA: Diagnosis not present

## 2011-04-11 DIAGNOSIS — M999 Biomechanical lesion, unspecified: Secondary | ICD-10-CM | POA: Diagnosis not present

## 2011-06-06 ENCOUNTER — Ambulatory Visit (INDEPENDENT_AMBULATORY_CARE_PROVIDER_SITE_OTHER): Payer: Medicare Other | Admitting: Cardiovascular Disease

## 2011-06-06 ENCOUNTER — Encounter: Payer: Self-pay | Admitting: Cardiovascular Disease

## 2011-06-06 VITALS — BP 140/73 | HR 57 | Ht 69.0 in | Wt 207.0 lb

## 2011-06-06 DIAGNOSIS — I25810 Atherosclerosis of coronary artery bypass graft(s) without angina pectoris: Secondary | ICD-10-CM

## 2011-06-06 DIAGNOSIS — E7800 Pure hypercholesterolemia, unspecified: Secondary | ICD-10-CM

## 2011-06-06 NOTE — Patient Instructions (Addendum)
Your physician wants you to follow-up in: 1 YEAR with Dr Excell Seltzer.  You will receive a reminder letter in the mail two months in advance. If you don't receive a letter, please call our office to schedule the follow-up appointment.  Your physician recommends that you return for a FASTING LIPID and LIVER Profile--nothing to eat or drink after midnight (ELAM)  Your physician recommends that you continue on your current medications as directed. Please refer to the Current Medication list given to you today.

## 2011-06-07 ENCOUNTER — Encounter: Payer: Self-pay | Admitting: Cardiovascular Disease

## 2011-06-07 NOTE — Progress Notes (Signed)
   HPI:  78 year old gentleman presenting for followup of coronary artery disease. The patient is 12 years out from 5 vessel CABG. His last stress test was in 2010 demonstrating anterolateral infarction with a mild degree of peri-infarct ischemia. He has been treated medically and has had no major problems.  The patient denies chest pain or tightness. He denies dyspnea, edema, or palpitations. He plays golf, but has not engaged in any routine exercise. He is compliant with his medical program.  Outpatient Encounter Prescriptions as of 06/06/2011  Medication Sig Dispense Refill  . amLODipine-benazepril (LOTREL) 10-40 MG per capsule Take 1 capsule by mouth daily.  90 capsule  3  . aspirin 81 MG tablet Take 81 mg by mouth daily.        . Coenzyme Q10 (CO Q 10) 100 MG CAPS Take by mouth. 200mg  1 tab per day      . colesevelam (WELCHOL) 625 MG tablet Take 6 tablets (3,750 mg total) by mouth daily.  540 tablet  3  . ezetimibe (ZETIA) 10 MG tablet Take 1 tablet (10 mg total) by mouth daily.  90 tablet  3  . metoprolol succinate (TOPROL-XL) 50 MG 24 hr tablet Take 1 tablet (50 mg total) by mouth daily.  90 tablet  3  . Multiple Vitamins-Minerals (CENTRUM SILVER PO) 1 tab po qd       . Omega-3 Fatty Acids (RA FISH OIL) 900 MG CAPS 1 tab po bid       . DISCONTD: glucosamine-chondroitin 500-400 MG tablet Take 1 tablet by mouth 2 (two) times daily.          Allergies  Allergen Reactions  . Iodine   . Statins     REACTION: Reaction not known    Past Medical History  Diagnosis Date  . Atrial fibrillation   . HTN (hypertension)   . CAD (coronary artery disease)      5 with the left internal mamary to the left anterior  descending coronary artery.  . Dyslipidemia   . DVT femoral (deep venous thrombosis) with thrombophlebitis     ROS: Negative except as per HPI  BP 140/73  Pulse 57  Ht 5\' 9"  (1.753 m)  Wt 93.895 kg (207 lb)  BMI 30.57 kg/m2  PHYSICAL EXAM: Pt is alert and oriented,  NAD HEENT: normal Neck: JVP - normal, carotids 2+= without bruits Lungs: CTA bilaterally CV: Bradycardic and regular without murmur or gallop Abd: soft, NT, Positive BS, no hepatomegaly Ext: no C/C/E, distal pulses intact and equal Skin: warm/dry no rash  EKG:  Sinus bradycardia 57 beats per minute, nonspecific ST abnormality, otherwise within normal limits.  ASSESSMENT AND PLAN: 1. Coronary artery disease status post CABG. The patient is stable without anginal symptoms. He will continue on his current medical program without changes.  2. Hyperlipidemia. Lipids were reviewed from one year ago. His cholesterol is 158, triglycerides 168, HDL 36, and LDL 88. His liver function tests were within normal limits. He is due for followup lipids and LFTs. He is treated with WelChol and zetia. The patient is statin intolerant.  3. Hypertension. Blood pressure is maintained on a combination of amlodipine, benazepril, and metoprolol succinate.   Tonny Bollman 06/07/2011 6:06 AM

## 2011-06-11 ENCOUNTER — Other Ambulatory Visit (INDEPENDENT_AMBULATORY_CARE_PROVIDER_SITE_OTHER): Payer: Medicare Other

## 2011-06-11 DIAGNOSIS — E7800 Pure hypercholesterolemia, unspecified: Secondary | ICD-10-CM | POA: Diagnosis not present

## 2011-06-11 DIAGNOSIS — I25810 Atherosclerosis of coronary artery bypass graft(s) without angina pectoris: Secondary | ICD-10-CM | POA: Diagnosis not present

## 2011-06-11 LAB — HEPATIC FUNCTION PANEL
ALT: 21 U/L (ref 0–53)
AST: 19 U/L (ref 0–37)
Albumin: 3.5 g/dL (ref 3.5–5.2)
Alkaline Phosphatase: 41 U/L (ref 39–117)
Bilirubin, Direct: 0.1 mg/dL (ref 0.0–0.3)
Total Bilirubin: 0.5 mg/dL (ref 0.3–1.2)
Total Protein: 7 g/dL (ref 6.0–8.3)

## 2011-06-11 LAB — LIPID PANEL
Cholesterol: 163 mg/dL (ref 0–200)
HDL: 37.3 mg/dL — ABNORMAL LOW (ref >39.00)
LDL Cholesterol: 89 mg/dL (ref 0–99)
Total CHOL/HDL Ratio: 4
Triglycerides: 183 mg/dL — ABNORMAL HIGH (ref 0.0–149.0)
VLDL: 36.6 mg/dL (ref 0.0–40.0)

## 2011-06-12 ENCOUNTER — Encounter: Payer: Self-pay | Admitting: Cardiovascular Disease

## 2011-06-12 NOTE — Telephone Encounter (Signed)
This encounter was created in error - please disregard.

## 2011-06-12 NOTE — Telephone Encounter (Signed)
Fu call Pt returning your call He said he wont be available until this afternoon

## 2011-06-12 NOTE — Telephone Encounter (Signed)
Follow-up:    Patient returned your call.  Please call back. 

## 2011-11-29 ENCOUNTER — Encounter: Payer: Self-pay | Admitting: Nurse Practitioner

## 2011-11-29 ENCOUNTER — Ambulatory Visit (INDEPENDENT_AMBULATORY_CARE_PROVIDER_SITE_OTHER): Payer: Medicare Other | Admitting: Nurse Practitioner

## 2011-11-29 VITALS — BP 158/80 | HR 60 | Ht 69.0 in | Wt 205.8 lb

## 2011-11-29 DIAGNOSIS — IMO0001 Reserved for inherently not codable concepts without codable children: Secondary | ICD-10-CM

## 2011-11-29 DIAGNOSIS — M791 Myalgia, unspecified site: Secondary | ICD-10-CM

## 2011-11-29 DIAGNOSIS — I259 Chronic ischemic heart disease, unspecified: Secondary | ICD-10-CM

## 2011-11-29 LAB — BASIC METABOLIC PANEL
BUN: 20 mg/dL (ref 6–23)
CO2: 28 mEq/L (ref 19–32)
Calcium: 9.4 mg/dL (ref 8.4–10.5)
Chloride: 105 mEq/L (ref 96–112)
Creatinine, Ser: 1.2 mg/dL (ref 0.4–1.5)
GFR: 60.85 mL/min (ref >60.00)
Glucose, Bld: 102 mg/dL — ABNORMAL HIGH (ref 70–99)
Potassium: 4.6 mEq/L (ref 3.5–5.1)
Sodium: 139 mEq/L (ref 135–145)

## 2011-11-29 LAB — LIPID PANEL
Cholesterol: 178 mg/dL (ref 0–200)
HDL: 35.2 mg/dL — ABNORMAL LOW (ref >39.00)
LDL Cholesterol: 106 mg/dL — ABNORMAL HIGH (ref 0–99)
Total CHOL/HDL Ratio: 5
Triglycerides: 183 mg/dL — ABNORMAL HIGH (ref 0.0–149.0)
VLDL: 36.6 mg/dL (ref 0.0–40.0)

## 2011-11-29 LAB — HEPATIC FUNCTION PANEL
ALT: 20 U/L (ref 0–53)
AST: 21 U/L (ref 0–37)
Albumin: 3.7 g/dL (ref 3.5–5.2)
Alkaline Phosphatase: 41 U/L (ref 39–117)
Bilirubin, Direct: 0 mg/dL (ref 0.0–0.3)
Total Bilirubin: 0.6 mg/dL (ref 0.3–1.2)
Total Protein: 7.3 g/dL (ref 6.0–8.3)

## 2011-11-29 LAB — SEDIMENTATION RATE: Sed Rate: 9 mm/hr (ref 0–22)

## 2011-11-29 NOTE — Progress Notes (Addendum)
Luke [name] Date of Birth: 1930-07-09 Medical Record #[id number]  History of Present Illness: Luke [name] is seen today for a work in visit. He is seen for Luke [name]. He has known CAD with remote CABG x 5 in 2001 per Luke [name]. His other issues include HLD and HTN. Last stress test in 2010 showed anterolateral infarction with a mild degree of peri-infarct ischemia and has been treated medically.   He was last here in May and felt to be doing ok. He apparently does not tolerate statin.  He comes in today. He is here alone. He is here for a couple of reasons. First, for the past few weeks he has had this "cramp" under his left shoulder and down his left side. It seems to be exertional. Does not really feel like his heart pain. Has not used any NTG or Tylenol. Not very active but still tries to play golf. Notes this with swinging the golf club but also when he goes up steps. Feels a little aching all over at times. Other issue is that his Welchol is just too expensive. Now costing over $900 for a 3 month supply. Has not tolerated Lipitor or Crestor in the past. Does not sound like he has tried pravachol or zocor. He is looking for a cheaper alternative and would like to get back on some statin. Has never tried very low dose 2 to 3 times a week. Blood pressure at home has been under 140 systolic.   Current Outpatient Prescriptions on File Prior to Visit  Medication Sig Dispense Refill  . amLODipine-benazepril (LOTREL) 10-40 MG per capsule Take 1 capsule by mouth daily.  90 capsule  3  . aspirin 81 MG tablet Take 81 mg by mouth daily.        . Coenzyme Q10 (CO Q 10) 100 MG CAPS Take by mouth. 200mg  1 tab per day      . colesevelam (WELCHOL) 625 MG tablet Take 6 tablets (3,750 mg total) by mouth daily.  540 tablet  3  . ezetimibe (ZETIA) 10 MG tablet Take 1 tablet (10 mg total) by mouth daily.  90 tablet  3  . metoprolol succinate (TOPROL-XL) 50 MG 24 hr tablet Take 1 tablet (50 mg total) by  mouth daily.  90 tablet  3  . Multiple Vitamins-Minerals (CENTRUM SILVER PO) 1 tab po qd       . Omega-3 Fatty Acids (RA FISH OIL) 900 MG CAPS 1 tab po bid         Allergies  Allergen Reactions  . Iodine   . Statins     REACTION: Reaction not known    Past Medical History  Diagnosis Date  . Atrial fibrillation   . HTN (hypertension)   . CAD (coronary artery disease)      5 with the left internal mamary to the left anterior  descending coronary artery.  . Dyslipidemia   . DVT femoral (deep venous thrombosis) with thrombophlebitis     Past Surgical History  Procedure Date  . Coronary artery bypass graft 2001    History  Smoking status  . Former Smoker  Smokeless tobacco  . Not on file    Comment: stopped in the 1950's    History  Alcohol Use No    Family History  Problem Relation Age of Onset  . Coronary artery disease      Review of Systems: The review of systems is per the HPI.  All other systems  were reviewed and are negative.  Physical Exam: BP 158/80  Pulse 60  Ht 5\' 9"  (1.753 m)  Wt 205 lb 12.8 oz (93.35 kg)  BMI 30.39 kg/m2 Patient is very pleasant and in no acute distress. Skin is warm and dry. Color is normal.  HEENT is unremarkable. Normocephalic/atraumatic. PERRL. Sclera are nonicteric. Neck is supple. No masses. No JVD. Lungs are clear. Cardiac exam shows a regular rate and rhythm. Abdomen is soft. Extremities are without edema. Gait and ROM are intact. No gross neurologic deficits noted.  LABORATORY DATA: EKG shows sinus rhythm with nonspecific changes.  Labs are pending for today.   Lab Results  Component Value Date   GLUCOSE 86 05/19/2010   CHOL 163 06/11/2011   TRIG 183.0* 06/11/2011   HDL 37.30* 06/11/2011   LDLDIRECT 139.9 06/18/2006   LDLCALC 89 06/11/2011   ALT 21 06/11/2011   AST 19 06/11/2011   NA 143 05/19/2010   K 4.8 05/19/2010   CL 112 05/19/2010   CREATININE 1.1 05/19/2010   BUN 20 05/19/2010   CO2 26 05/19/2010    Assessment /  Plan: 1. Atypical chest pain - known CAD with remote CABG from 2001. Last Myoview in 2010 showing mild peri infarct ischemia. His symptoms sound more musculoskeletal but we will update his Myoview. He is not able to exercise. Will arrange for Lexiscan.   2. HLD - will recheck his labs today. May consider other options as described above.   3. HTN - he has good control of his BP at home.   Patient is agreeable to this plan and will call if any problems develop in the interim.    Addendum: 12/11/11  Patient presents today for his Myoview. He has brought in some formulary information for his medicines to reduce his costs. We will change him to Norvasc 10 mg daily; Benazepril 40 mg daily, Lopressor 25 mg BID. He has been switched to low dose Lipitor in the place of his Welchol. Hopefully he will be able to tolerate.

## 2011-11-29 NOTE — Patient Instructions (Addendum)
We will check some labs today (BMET, Lipids, HPF and sed rate)  We are going to arrange for a stress test (lexiscan)  You may use some Tylenol as needed  Stay on your other medicines  Try to be active.   We will see you back in May unless your test is abnormal.  Call the [facility] office at [phone] if you have any questions, problems or concerns.

## 2011-11-30 ENCOUNTER — Telehealth: Payer: Self-pay | Admitting: *Deleted

## 2011-11-30 ENCOUNTER — Other Ambulatory Visit: Payer: Self-pay | Admitting: Nurse Practitioner

## 2011-11-30 MED ORDER — ATORVASTATIN CALCIUM 10 MG PO TABS: 5.0000 mg | ORAL_TABLET | Freq: Every day | ORAL | Status: DC

## 2011-11-30 MED ORDER — ATORVASTATIN CALCIUM 10 MG PO TABS: ORAL_TABLET | ORAL | Status: DC

## 2011-11-30 NOTE — Telephone Encounter (Signed)
Pt aware of lab results 

## 2011-12-03 ENCOUNTER — Telehealth: Payer: Self-pay | Admitting: Nurse Practitioner

## 2011-12-03 NOTE — Telephone Encounter (Signed)
New Problem:    Called in with the information you requested a few days ago.  Please call back.

## 2011-12-03 NOTE — Telephone Encounter (Signed)
t/w pt about new insurance will send in new med list and will try to change to cheaper meds and will send 90 day scripts back to pt

## 2011-12-05 NOTE — Patient Instructions (Addendum)
We will stop the Amlodipine/Benazepril 10/40  We will stop the Toprol  You will need to start taking Amlodipine 10 mg daily  You will need to start taking Benazepril 40 mg daily  You will need to start taking Lopressor (metoprolol tartrate) 25 mg BID

## 2011-12-11 ENCOUNTER — Other Ambulatory Visit: Payer: Self-pay | Admitting: *Deleted

## 2011-12-11 ENCOUNTER — Ambulatory Visit (HOSPITAL_COMMUNITY): Payer: Medicare Other | Attending: Nurse Practitioner | Admitting: Radiology

## 2011-12-11 VITALS — BP 144/70 | HR 122 | Ht 69.0 in | Wt 205.0 lb

## 2011-12-11 DIAGNOSIS — I2510 Atherosclerotic heart disease of native coronary artery without angina pectoris: Secondary | ICD-10-CM | POA: Diagnosis not present

## 2011-12-11 DIAGNOSIS — I4949 Other premature depolarization: Secondary | ICD-10-CM

## 2011-12-11 DIAGNOSIS — I10 Essential (primary) hypertension: Secondary | ICD-10-CM | POA: Diagnosis not present

## 2011-12-11 DIAGNOSIS — Z8249 Family history of ischemic heart disease and other diseases of the circulatory system: Secondary | ICD-10-CM | POA: Diagnosis not present

## 2011-12-11 DIAGNOSIS — R0789 Other chest pain: Secondary | ICD-10-CM | POA: Diagnosis not present

## 2011-12-11 DIAGNOSIS — I259 Chronic ischemic heart disease, unspecified: Secondary | ICD-10-CM

## 2011-12-11 DIAGNOSIS — R079 Chest pain, unspecified: Secondary | ICD-10-CM | POA: Diagnosis not present

## 2011-12-11 DIAGNOSIS — R0609 Other forms of dyspnea: Secondary | ICD-10-CM | POA: Insufficient documentation

## 2011-12-11 DIAGNOSIS — I999 Unspecified disorder of circulatory system: Secondary | ICD-10-CM | POA: Diagnosis not present

## 2011-12-11 DIAGNOSIS — R0989 Other specified symptoms and signs involving the circulatory and respiratory systems: Secondary | ICD-10-CM | POA: Insufficient documentation

## 2011-12-11 DIAGNOSIS — I25810 Atherosclerosis of coronary artery bypass graft(s) without angina pectoris: Secondary | ICD-10-CM

## 2011-12-11 MED ORDER — TECHNETIUM TC 99M SESTAMIBI GENERIC - CARDIOLITE
32.0000 | Freq: Once | INTRAVENOUS | Status: AC | PRN
  Administered 2011-12-11: 32 via INTRAVENOUS

## 2011-12-11 MED ORDER — BENAZEPRIL HCL 40 MG PO TABS: 40.0000 mg | ORAL_TABLET | Freq: Every day | ORAL | Status: DC

## 2011-12-11 MED ORDER — TECHNETIUM TC 99M SESTAMIBI GENERIC - CARDIOLITE
11.0000 | Freq: Once | INTRAVENOUS | Status: AC | PRN
  Administered 2011-12-11: 11 via INTRAVENOUS

## 2011-12-11 MED ORDER — REGADENOSON 0.4 MG/5ML IV SOLN
0.4000 mg | Freq: Once | INTRAVENOUS | Status: AC
  Administered 2011-12-11: 0.4 mg via INTRAVENOUS

## 2011-12-11 MED ORDER — AMLODIPINE BESYLATE 10 MG PO TABS: 10.0000 mg | ORAL_TABLET | Freq: Every day | ORAL | Status: DC

## 2011-12-11 MED ORDER — METOPROLOL TARTRATE 25 MG PO TABS: 25.0000 mg | ORAL_TABLET | Freq: Two times a day (BID) | ORAL | Status: DC

## 2011-12-11 NOTE — Progress Notes (Signed)
[facility] SITE 3 NUCLEAR MED [address] [id number] [place] [address] [phone]  Cardiology Nuclear Med Study  Luke [name] is a 78 y.o. male     MRN : [id number]     DOB: [birth year]  Procedure Date: 12/11/2011  Nuclear Med Background Indication for Stress Test:  Evaluation for Ischemia and Graft Patency History:  '01 CABG; '10 GEX:BMWU with mild PI ischemia, EF=65%-medical tx Cardiac Risk Factors: Family History - CAD, History of Smoking, Hypertension, Lipids, Overweight and TIA  Symptoms:  Chest Tightness with Exertion (last episode of chest discomfort was 2-days ago, but c/o back and should pain now), DOE and Fatigue with Exertion   Nuclear Pre-Procedure Caffeine/Decaff Intake:  None NPO After: 7:00pm   Lungs:  Clear. O2 Sat: 96% on room air. IV 0.9% NS with Angio Cath:  22g  IV Site: R Hand  IV Started by:  Cathlyn Parsons, RN  Chest Size (in):  44 Cup Size: n/a  Height: 5\' 9"  (1.753 m)  Weight:  205 lb (92.987 kg)  BMI:  Body mass index is 30.27 kg/(m^2). Tech Comments:  Toprol taken at 0800    Nuclear Med Study 1 or 2 day study: 1 day  Stress Test Type:  Treadmill/Lexiscan  Reading MD: Charlton Haws, MD  Order Authorizing Provider:  Tonny Bollman, MD and Norma Fredrickson, NP  Resting Radionuclide: Technetium 42m Sestamibi  Resting Radionuclide Dose: 11.0 mCi   Stress Radionuclide:  Technetium 46m Sestamibi  Stress Radionuclide Dose: 32.0 mCi           Stress Protocol Rest HR: 61 Stress HR: 122  Rest BP: 144/70 Stress BP: 189/62  Exercise Time (min): 2:00 METS: n/a   Predicted Max HR: 141 bpm % Max HR: 86.52 bpm Rate Pressure Product: 13244   Dose of Adenosine (mg):  n/a Dose of Lexiscan: 0.4 mg  Dose of Atropine (mg): n/a Dose of Dobutamine: n/a mcg/kg/min (at max HR)  Stress Test Technologist: Smiley Houseman, CMA-N  Nuclear Technologist:  Domenic Polite, CNMT     Rest Procedure:  Myocardial perfusion imaging was performed  at rest 45 minutes following the intravenous administration of Technetium 55m Sestamibi.  Rest ECG: NSR - Normal EKG  Stress Procedure:  The patient received IV Lexiscan 0.4 mg over 15-seconds with concurrent low level exercise and then Technetium 75m Sestamibi was injected at 30-seconds while the patient continued walking one more minute.  Quantitative spect images were obtained after a 45-minute delay.  Stress ECG: No significant change from baseline ECG  QPS Raw Data Images:  Normal; no motion artifact; normal heart/lung ratio. Stress Images:  Decreased uptake in anterolateral wall Rest Images:  Decreased uptake in anterolateral wall Subtraction (SDS):  QPS 2 Transient Ischemic Dilatation (Normal <1.22):  1.18 Lung/Heart Ratio (Normal <0.45):  0.32  Quantitative Gated Spect Images QGS EDV:  103 ml QGS ESV:  40 ml  Impression Exercise Capacity:  Lexiscan with no exercise. BP Response:  Normal blood pressure response. Clinical Symptoms:  Stomach Ache ECG Impression:  No significant ST segment change suggestive of ischemia. Comparison with Prior Nuclear Study: No images to compare  Overall Impression:  Low risk stress nuclear study.  Small anterolateral infarct with mild peri infarct ischemia Similar to description of 2010 myovue  LV Ejection Fraction: 61%.  LV Wall Motion:  NL LV Function; NL Wall Motion   Charlton Haws

## 2011-12-18 DIAGNOSIS — Z961 Presence of intraocular lens: Secondary | ICD-10-CM

## 2011-12-18 DIAGNOSIS — H2510 Age-related nuclear cataract, unspecified eye: Secondary | ICD-10-CM | POA: Diagnosis not present

## 2011-12-18 HISTORY — DX: Presence of intraocular lens: Z96.1

## 2011-12-24 ENCOUNTER — Telehealth: Payer: Self-pay | Admitting: Nurse Practitioner

## 2011-12-24 MED ORDER — METOPROLOL TARTRATE 25 MG PO TABS: 25.0000 mg | ORAL_TABLET | Freq: Two times a day (BID) | ORAL | Status: DC

## 2011-12-24 NOTE — Telephone Encounter (Signed)
t/w pt needs a script sent to him for metoprolol tartrate 25 mg bid for #180 w/ 3 refills I will send to pt 12/16

## 2011-12-24 NOTE — Telephone Encounter (Signed)
Pt calling re drug rx you sent for him, pharmacy questioning how it was written, pt wants you to call him

## 2012-01-30 DIAGNOSIS — M1990 Unspecified osteoarthritis, unspecified site: Secondary | ICD-10-CM | POA: Insufficient documentation

## 2012-01-30 DIAGNOSIS — E785 Hyperlipidemia, unspecified: Secondary | ICD-10-CM

## 2012-01-30 HISTORY — DX: Hyperlipidemia, unspecified: E78.5

## 2012-01-30 HISTORY — DX: Unspecified osteoarthritis, unspecified site: M19.90

## 2012-02-07 ENCOUNTER — Telehealth: Payer: Self-pay | Admitting: Cardiovascular Disease

## 2012-02-07 MED ORDER — BENAZEPRIL HCL 40 MG PO TABS: 40.0000 mg | ORAL_TABLET | Freq: Every day | ORAL | Status: DC

## 2012-02-07 MED ORDER — AMLODIPINE BESYLATE 10 MG PO TABS: 10.0000 mg | ORAL_TABLET | Freq: Every day | ORAL | Status: DC

## 2012-02-07 NOTE — Telephone Encounter (Signed)
I spoke with the pt and he needs a 7 day supply of Amlodipine and [facility]azepril [facility]  The pt is waiting on his mail order medications to arrive.  Rx sent.  The pt is also scheduled for cataract surgery on 2/3 and would like to know if he needs to hold any of his medications.  I made the pt aware that he does not need to stop any of his medications for cataract surgery.

## 2012-02-07 NOTE — Telephone Encounter (Signed)
New problem:   Message attached to refill request    Recent eye surgery on  2/3. lease advise on medication .

## 2012-02-07 NOTE — Telephone Encounter (Signed)
norvasc  10 mg    Benazepril  40 mg.      [facility] [place]

## 2012-02-11 DIAGNOSIS — Z961 Presence of intraocular lens: Secondary | ICD-10-CM | POA: Diagnosis not present

## 2012-02-11 DIAGNOSIS — Z79899 Other long term (current) drug therapy: Secondary | ICD-10-CM | POA: Diagnosis not present

## 2012-02-11 DIAGNOSIS — I10 Essential (primary) hypertension: Secondary | ICD-10-CM | POA: Diagnosis not present

## 2012-02-11 DIAGNOSIS — Z7982 Long term (current) use of aspirin: Secondary | ICD-10-CM | POA: Diagnosis not present

## 2012-02-11 DIAGNOSIS — M1990 Unspecified osteoarthritis, unspecified site: Secondary | ICD-10-CM | POA: Diagnosis not present

## 2012-02-11 DIAGNOSIS — H2510 Age-related nuclear cataract, unspecified eye: Secondary | ICD-10-CM | POA: Diagnosis not present

## 2012-02-11 DIAGNOSIS — I2510 Atherosclerotic heart disease of native coronary artery without angina pectoris: Secondary | ICD-10-CM | POA: Diagnosis not present

## 2012-02-11 DIAGNOSIS — E785 Hyperlipidemia, unspecified: Secondary | ICD-10-CM | POA: Diagnosis not present

## 2012-02-11 DIAGNOSIS — Z8673 Personal history of transient ischemic attack (TIA), and cerebral infarction without residual deficits: Secondary | ICD-10-CM | POA: Diagnosis not present

## 2012-02-14 ENCOUNTER — Other Ambulatory Visit: Payer: Self-pay | Admitting: *Deleted

## 2012-02-14 MED ORDER — EZETIMIBE 10 MG PO TABS: 10.0000 mg | ORAL_TABLET | Freq: Every day | ORAL | Status: DC

## 2012-02-15 ENCOUNTER — Other Ambulatory Visit: Payer: Self-pay | Admitting: *Deleted

## 2012-02-15 MED ORDER — EZETIMIBE 10 MG PO TABS: 10.0000 mg | ORAL_TABLET | Freq: Every day | ORAL | Status: DC

## 2012-02-23 ENCOUNTER — Other Ambulatory Visit: Payer: Self-pay

## 2012-04-23 DIAGNOSIS — M999 Biomechanical lesion, unspecified: Secondary | ICD-10-CM | POA: Diagnosis not present

## 2012-04-23 DIAGNOSIS — M545 Low back pain, unspecified: Secondary | ICD-10-CM | POA: Diagnosis not present

## 2012-04-23 DIAGNOSIS — M5430 Sciatica, unspecified side: Secondary | ICD-10-CM | POA: Diagnosis not present

## 2012-04-30 DIAGNOSIS — Z1331 Encounter for screening for depression: Secondary | ICD-10-CM | POA: Diagnosis not present

## 2012-04-30 DIAGNOSIS — M519 Unspecified thoracic, thoracolumbar and lumbosacral intervertebral disc disorder: Secondary | ICD-10-CM | POA: Diagnosis not present

## 2012-04-30 DIAGNOSIS — M25559 Pain in unspecified hip: Secondary | ICD-10-CM | POA: Diagnosis not present

## 2012-05-01 DIAGNOSIS — M545 Low back pain, unspecified: Secondary | ICD-10-CM | POA: Diagnosis not present

## 2012-05-01 DIAGNOSIS — M999 Biomechanical lesion, unspecified: Secondary | ICD-10-CM | POA: Diagnosis not present

## 2012-05-01 DIAGNOSIS — M5430 Sciatica, unspecified side: Secondary | ICD-10-CM | POA: Diagnosis not present

## 2012-05-07 DIAGNOSIS — M545 Low back pain, unspecified: Secondary | ICD-10-CM | POA: Diagnosis not present

## 2012-05-07 DIAGNOSIS — M999 Biomechanical lesion, unspecified: Secondary | ICD-10-CM | POA: Diagnosis not present

## 2012-05-07 DIAGNOSIS — M5430 Sciatica, unspecified side: Secondary | ICD-10-CM | POA: Diagnosis not present

## 2012-05-21 DIAGNOSIS — M5430 Sciatica, unspecified side: Secondary | ICD-10-CM | POA: Diagnosis not present

## 2012-05-21 DIAGNOSIS — M545 Low back pain, unspecified: Secondary | ICD-10-CM | POA: Diagnosis not present

## 2012-05-21 DIAGNOSIS — M999 Biomechanical lesion, unspecified: Secondary | ICD-10-CM | POA: Diagnosis not present

## 2012-05-28 DIAGNOSIS — M545 Low back pain, unspecified: Secondary | ICD-10-CM | POA: Diagnosis not present

## 2012-05-28 DIAGNOSIS — M5430 Sciatica, unspecified side: Secondary | ICD-10-CM | POA: Diagnosis not present

## 2012-05-28 DIAGNOSIS — M999 Biomechanical lesion, unspecified: Secondary | ICD-10-CM | POA: Diagnosis not present

## 2012-06-04 DIAGNOSIS — M5430 Sciatica, unspecified side: Secondary | ICD-10-CM | POA: Diagnosis not present

## 2012-06-04 DIAGNOSIS — M999 Biomechanical lesion, unspecified: Secondary | ICD-10-CM | POA: Diagnosis not present

## 2012-06-04 DIAGNOSIS — M545 Low back pain, unspecified: Secondary | ICD-10-CM | POA: Diagnosis not present

## 2012-06-18 DIAGNOSIS — M999 Biomechanical lesion, unspecified: Secondary | ICD-10-CM | POA: Diagnosis not present

## 2012-06-18 DIAGNOSIS — M545 Low back pain, unspecified: Secondary | ICD-10-CM | POA: Diagnosis not present

## 2012-06-18 DIAGNOSIS — M5430 Sciatica, unspecified side: Secondary | ICD-10-CM | POA: Diagnosis not present

## 2012-07-02 DIAGNOSIS — M5430 Sciatica, unspecified side: Secondary | ICD-10-CM | POA: Diagnosis not present

## 2012-07-02 DIAGNOSIS — M999 Biomechanical lesion, unspecified: Secondary | ICD-10-CM | POA: Diagnosis not present

## 2012-07-02 DIAGNOSIS — M545 Low back pain, unspecified: Secondary | ICD-10-CM | POA: Diagnosis not present

## 2012-07-16 DIAGNOSIS — M545 Low back pain, unspecified: Secondary | ICD-10-CM | POA: Diagnosis not present

## 2012-07-16 DIAGNOSIS — M999 Biomechanical lesion, unspecified: Secondary | ICD-10-CM | POA: Diagnosis not present

## 2012-07-16 DIAGNOSIS — M5430 Sciatica, unspecified side: Secondary | ICD-10-CM | POA: Diagnosis not present

## 2012-07-30 DIAGNOSIS — M47817 Spondylosis without myelopathy or radiculopathy, lumbosacral region: Secondary | ICD-10-CM | POA: Diagnosis not present

## 2012-07-30 DIAGNOSIS — M999 Biomechanical lesion, unspecified: Secondary | ICD-10-CM | POA: Diagnosis not present

## 2012-07-30 DIAGNOSIS — M5137 Other intervertebral disc degeneration, lumbosacral region: Secondary | ICD-10-CM | POA: Diagnosis not present

## 2012-07-30 DIAGNOSIS — M545 Low back pain, unspecified: Secondary | ICD-10-CM | POA: Diagnosis not present

## 2012-08-13 ENCOUNTER — Other Ambulatory Visit: Payer: Self-pay

## 2012-09-16 DIAGNOSIS — M999 Biomechanical lesion, unspecified: Secondary | ICD-10-CM | POA: Diagnosis not present

## 2012-09-16 DIAGNOSIS — M545 Low back pain, unspecified: Secondary | ICD-10-CM | POA: Diagnosis not present

## 2012-09-16 DIAGNOSIS — M47817 Spondylosis without myelopathy or radiculopathy, lumbosacral region: Secondary | ICD-10-CM | POA: Diagnosis not present

## 2012-09-16 DIAGNOSIS — M5137 Other intervertebral disc degeneration, lumbosacral region: Secondary | ICD-10-CM | POA: Diagnosis not present

## 2012-10-07 DIAGNOSIS — M545 Low back pain, unspecified: Secondary | ICD-10-CM | POA: Diagnosis not present

## 2012-10-07 DIAGNOSIS — M5430 Sciatica, unspecified side: Secondary | ICD-10-CM | POA: Diagnosis not present

## 2012-10-07 DIAGNOSIS — M999 Biomechanical lesion, unspecified: Secondary | ICD-10-CM | POA: Diagnosis not present

## 2012-10-23 DIAGNOSIS — M545 Low back pain, unspecified: Secondary | ICD-10-CM | POA: Diagnosis not present

## 2012-10-23 DIAGNOSIS — M999 Biomechanical lesion, unspecified: Secondary | ICD-10-CM | POA: Diagnosis not present

## 2012-10-23 DIAGNOSIS — M5430 Sciatica, unspecified side: Secondary | ICD-10-CM | POA: Diagnosis not present

## 2012-11-24 DIAGNOSIS — M9981 Other biomechanical lesions of cervical region: Secondary | ICD-10-CM | POA: Diagnosis not present

## 2012-11-24 DIAGNOSIS — M545 Low back pain, unspecified: Secondary | ICD-10-CM | POA: Diagnosis not present

## 2012-11-24 DIAGNOSIS — M999 Biomechanical lesion, unspecified: Secondary | ICD-10-CM | POA: Diagnosis not present

## 2012-11-24 DIAGNOSIS — M542 Cervicalgia: Secondary | ICD-10-CM | POA: Diagnosis not present

## 2012-12-19 ENCOUNTER — Other Ambulatory Visit: Payer: Self-pay | Admitting: *Deleted

## 2012-12-19 MED ORDER — AMLODIPINE BESYLATE 10 MG PO TABS: 10.0000 mg | ORAL_TABLET | Freq: Every day | ORAL | Status: DC

## 2012-12-19 MED ORDER — METOPROLOL TARTRATE 25 MG PO TABS: 25.0000 mg | ORAL_TABLET | Freq: Two times a day (BID) | ORAL | Status: DC

## 2012-12-19 MED ORDER — BENAZEPRIL HCL 40 MG PO TABS: 40.0000 mg | ORAL_TABLET | Freq: Every day | ORAL | Status: DC

## 2012-12-19 MED ORDER — ATORVASTATIN CALCIUM 10 MG PO TABS: ORAL_TABLET | ORAL | Status: DC

## 2012-12-22 DIAGNOSIS — M542 Cervicalgia: Secondary | ICD-10-CM | POA: Diagnosis not present

## 2012-12-22 DIAGNOSIS — M545 Low back pain, unspecified: Secondary | ICD-10-CM | POA: Diagnosis not present

## 2012-12-22 DIAGNOSIS — M9981 Other biomechanical lesions of cervical region: Secondary | ICD-10-CM | POA: Diagnosis not present

## 2012-12-22 DIAGNOSIS — M999 Biomechanical lesion, unspecified: Secondary | ICD-10-CM | POA: Diagnosis not present

## 2013-01-09 ENCOUNTER — Telehealth: Payer: Self-pay | Admitting: Cardiovascular Disease

## 2013-01-09 DIAGNOSIS — E785 Hyperlipidemia, unspecified: Secondary | ICD-10-CM

## 2013-01-09 DIAGNOSIS — I2510 Atherosclerotic heart disease of native coronary artery without angina pectoris: Secondary | ICD-10-CM

## 2013-01-09 NOTE — Telephone Encounter (Signed)
New message      Pt wants to know if he will have a lab before appt

## 2013-01-09 NOTE — Telephone Encounter (Signed)
The pt can have a BMP, Lipid and liver drawn prior to appointment. The pt would like to have these drawn at the [facility] lab.  Orders will be placed in the system and the pt plans to go on 02/02/13.

## 2013-01-19 DIAGNOSIS — M5137 Other intervertebral disc degeneration, lumbosacral region: Secondary | ICD-10-CM | POA: Diagnosis not present

## 2013-01-19 DIAGNOSIS — M545 Low back pain, unspecified: Secondary | ICD-10-CM | POA: Diagnosis not present

## 2013-01-19 DIAGNOSIS — M999 Biomechanical lesion, unspecified: Secondary | ICD-10-CM | POA: Diagnosis not present

## 2013-02-02 ENCOUNTER — Other Ambulatory Visit (INDEPENDENT_AMBULATORY_CARE_PROVIDER_SITE_OTHER): Payer: Medicare Other

## 2013-02-02 DIAGNOSIS — I2510 Atherosclerotic heart disease of native coronary artery without angina pectoris: Secondary | ICD-10-CM | POA: Diagnosis not present

## 2013-02-02 DIAGNOSIS — E785 Hyperlipidemia, unspecified: Secondary | ICD-10-CM

## 2013-02-02 LAB — LIPID PANEL
Cholesterol: 142 mg/dL (ref 0–200)
HDL: 33.4 mg/dL — ABNORMAL LOW (ref >39.00)
LDL Cholesterol: 86 mg/dL (ref 0–99)
Total CHOL/HDL Ratio: 4
Triglycerides: 111 mg/dL (ref 0.0–149.0)
VLDL: 22.2 mg/dL (ref 0.0–40.0)

## 2013-02-02 LAB — HEPATIC FUNCTION PANEL
ALT: 20 U/L (ref 0–53)
AST: 22 U/L (ref 0–37)
Albumin: 3.5 g/dL (ref 3.5–5.2)
Alkaline Phosphatase: 41 U/L (ref 39–117)
Bilirubin, Direct: 0 mg/dL (ref 0.0–0.3)
Total Bilirubin: 0.6 mg/dL (ref 0.3–1.2)
Total Protein: 7 g/dL (ref 6.0–8.3)

## 2013-02-02 LAB — BASIC METABOLIC PANEL
BUN: 19 mg/dL (ref 6–23)
CO2: 26 mEq/L (ref 19–32)
Calcium: 9 mg/dL (ref 8.4–10.5)
Chloride: 109 mEq/L (ref 96–112)
Creatinine, Ser: 1.2 mg/dL (ref 0.4–1.5)
GFR: 61.83 mL/min (ref >60.00)
Glucose, Bld: 87 mg/dL (ref 70–99)
Potassium: 4 mEq/L (ref 3.5–5.1)
Sodium: 141 mEq/L (ref 135–145)

## 2013-02-05 ENCOUNTER — Ambulatory Visit (INDEPENDENT_AMBULATORY_CARE_PROVIDER_SITE_OTHER): Payer: Medicare Other | Admitting: Cardiovascular Disease

## 2013-02-05 ENCOUNTER — Encounter: Payer: Self-pay | Admitting: Cardiovascular Disease

## 2013-02-05 VITALS — BP 140/60 | HR 56 | Ht 69.0 in | Wt 210.0 lb

## 2013-02-05 DIAGNOSIS — I25810 Atherosclerosis of coronary artery bypass graft(s) without angina pectoris: Secondary | ICD-10-CM

## 2013-02-05 DIAGNOSIS — I10 Essential (primary) hypertension: Secondary | ICD-10-CM

## 2013-02-05 DIAGNOSIS — E785 Hyperlipidemia, unspecified: Secondary | ICD-10-CM

## 2013-02-05 MED ORDER — BENAZEPRIL HCL 40 MG PO TABS: 40.0000 mg | ORAL_TABLET | Freq: Every day | ORAL | Status: DC

## 2013-02-05 MED ORDER — COLESEVELAM HCL 625 MG PO TABS: 3750.0000 mg | ORAL_TABLET | Freq: Every day | ORAL | Status: DC

## 2013-02-05 MED ORDER — AMLODIPINE BESYLATE 10 MG PO TABS: 10.0000 mg | ORAL_TABLET | Freq: Every day | ORAL | Status: DC

## 2013-02-05 MED ORDER — METOPROLOL TARTRATE 25 MG PO TABS: 25.0000 mg | ORAL_TABLET | Freq: Two times a day (BID) | ORAL | Status: DC

## 2013-02-05 MED ORDER — EZETIMIBE 10 MG PO TABS: 10.0000 mg | ORAL_TABLET | Freq: Every day | ORAL | Status: DC

## 2013-02-05 MED ORDER — ATORVASTATIN CALCIUM 10 MG PO TABS: ORAL_TABLET | ORAL | Status: DC

## 2013-02-05 NOTE — Progress Notes (Signed)
HPI:  80 year old gentleman presenting for followup evaluation. He is followed for coronary artery disease and underwent multivessel CABG about 14 years ago. His last stress test was in December 2013 and demonstrated small anterolateral infarct with mild peri-infarct ischemia. This was unchanged from his previous nuclear scan in 2010. His left ventricular ejection fraction was normal at 61%.  Labs were just done January 26. This demonstrates a normal creatinine of 1.2, fasting glucose 87, normal liver function tests, total cholesterol 142, triglycerides 111, HDL 33, and LDL 86.  Overall he is doing fairly well. He's not had recent problems with chest pain or shortness of breath. He does have an occasional pain in his chest but it is always self-limited and unrelated to exertion. No symptoms of late. 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 He is still able to get out and play golf. He has no limitation from cardiopulmonary symptoms. No edema, palpitations, lightheadedness, orthopnea, or PND.  Outpatient Encounter Prescriptions as of 02/05/2013  Medication Sig  . amLODipine (NORVASC) 10 MG tablet Take 1 tablet (10 mg total) by mouth daily.  [name] Kitchen aspirin 81 MG tablet Take 81 mg by mouth daily.    [name] Kitchen atorvastatin (LIPITOR) 10 MG tablet Take only M-W-F  . benazepril (LOTENSIN) 40 MG tablet Take 1 tablet (40 mg total) by mouth daily.  . Coenzyme Q10 (CO Q 10) 100 MG CAPS Take by mouth. 200mg  1 tab per day  . colesevelam (WELCHOL) 625 MG tablet Take 6 tablets (3,750 mg total) by mouth daily.  [name] Kitchen ezetimibe (ZETIA) 10 MG tablet Take 1 tablet (10 mg total) by mouth daily.  . metoprolol tartrate (LOPRESSOR) 25 MG tablet Take 1 tablet (25 mg total) by mouth 2 (two) times daily.  . Multiple Vitamins-Minerals (CENTRUM SILVER PO) 1 tab po qd   . Omega-3 Fatty Acids (RA FISH OIL) 900 MG CAPS 1 tab po bid     Allergies  Allergen Reactions  . Iodine   .  Statins     REACTION: Reaction not known    Past Medical History  Diagnosis Date  . Atrial fibrillation   . HTN (hypertension)   . CAD (coronary artery disease)      5 with the left internal mamary to the left anterior  descending coronary artery.  . Dyslipidemia   . DVT femoral (deep venous thrombosis) with thrombophlebitis    ROS: Negative except as per HPI  BP 140/60  Pulse 56  Ht 5\' 9"  (1.753 m)  Wt 210 lb (95.255 kg)  BMI 31.00 kg/m2  PHYSICAL EXAM: Pt is alert and oriented, pleasant elderly male in NAD HEENT: normal Neck: JVP - normal, carotids 2+= without bruits Lungs: CTA bilaterally CV: RRR without murmur or gallop Abd: soft, NT, Positive BS, no hepatomegaly Ext: no C/C/E, distal pulses intact and equal Skin: warm/dry no rash  EKG:  Sinus bradycardia with occasional PAC, heart rate 56 beats per minute, first degree AV block.  Lexiscan Myoview: 12/11/2011 Impression  Exercise Capacity: Lexiscan with no exercise.  BP Response: Normal blood pressure response.  Clinical Symptoms: Stomach Ache  ECG Impression: No significant ST segment change suggestive of ischemia.  Comparison with Prior Nuclear Study: No images to compare  Overall Impression: Low risk stress nuclear study. Small anterolateral infarct with mild peri infarct ischemia Similar to description of 2010 myovue  LV Ejection Fraction: 61%. LV Wall Motion: NL LV Function;  NL Wall Motion   ASSESSMENT AND PLAN: 1. Coronary artery disease, native vessel. He is out 14 years from CABG and is having no symptoms of angina. He had a low risk stress nuclear study last year. Continue clinical followup. I will see him back in one year. He's on aspirin for antiplatelet therapy.  2. Hyperlipidemia. The patient is on multidrug therapy with atorvastatin, WelChol, and zetia. We gave him a copy of his lab work today.  3. Hypertension. Blood pressure is well controlled on metoprolol tartrate, amlodipine, and benazepril. His  meds were changed to generics/formulary by Cecille Rubin last year and he's very appreciate of all of cost savings.  For follow-up I'll see him back in one year. Encouraged to remain as physically active as possible.   Sherren Mocha 02/05/2013 10:17 AM

## 2013-02-05 NOTE — Patient Instructions (Signed)
Your physician wants you to follow-up in: 1 year with Dr. Cooper.  You will receive a reminder letter in the mail two months in advance. If you don't receive a letter, please call our office to schedule the follow-up appointment.  Your physician recommends that you continue on your current medications as directed. Please refer to the Current Medication list given to you today.  

## 2013-02-09 DIAGNOSIS — M999 Biomechanical lesion, unspecified: Secondary | ICD-10-CM | POA: Diagnosis not present

## 2013-02-09 DIAGNOSIS — M5137 Other intervertebral disc degeneration, lumbosacral region: Secondary | ICD-10-CM | POA: Diagnosis not present

## 2013-02-09 DIAGNOSIS — M545 Low back pain, unspecified: Secondary | ICD-10-CM | POA: Diagnosis not present

## 2013-02-16 ENCOUNTER — Other Ambulatory Visit: Payer: Self-pay | Admitting: *Deleted

## 2013-02-17 ENCOUNTER — Telehealth: Payer: Self-pay | Admitting: Cardiovascular Disease

## 2013-02-17 NOTE — Telephone Encounter (Signed)
New message     Talk to a nurse to go over medications. Pt said we do not have it correct in the chart

## 2013-02-17 NOTE — Telephone Encounter (Signed)
Spoke with patient who was concerned because he received a letter from [facility] concerning a refill for [employer].  Patient states he has not taken Welchol since 2013 and wants to make certain we do not have it on his medication list.  I advised patient that Earnestine Mealing has been removed and I reviewed all of his other medications with him for accuracy.  Patient thanked me for the call.

## 2013-03-03 DIAGNOSIS — M5137 Other intervertebral disc degeneration, lumbosacral region: Secondary | ICD-10-CM | POA: Diagnosis not present

## 2013-03-03 DIAGNOSIS — M545 Low back pain, unspecified: Secondary | ICD-10-CM | POA: Diagnosis not present

## 2013-03-03 DIAGNOSIS — M999 Biomechanical lesion, unspecified: Secondary | ICD-10-CM | POA: Diagnosis not present

## 2013-03-24 DIAGNOSIS — M999 Biomechanical lesion, unspecified: Secondary | ICD-10-CM | POA: Diagnosis not present

## 2013-03-24 DIAGNOSIS — M545 Low back pain, unspecified: Secondary | ICD-10-CM | POA: Diagnosis not present

## 2013-03-24 DIAGNOSIS — M5430 Sciatica, unspecified side: Secondary | ICD-10-CM | POA: Diagnosis not present

## 2013-04-06 DIAGNOSIS — R05 Cough: Secondary | ICD-10-CM | POA: Diagnosis not present

## 2013-04-06 DIAGNOSIS — R059 Cough, unspecified: Secondary | ICD-10-CM | POA: Diagnosis not present

## 2013-04-24 DIAGNOSIS — M999 Biomechanical lesion, unspecified: Secondary | ICD-10-CM | POA: Diagnosis not present

## 2013-04-24 DIAGNOSIS — M545 Low back pain, unspecified: Secondary | ICD-10-CM | POA: Diagnosis not present

## 2013-04-24 DIAGNOSIS — M5430 Sciatica, unspecified side: Secondary | ICD-10-CM | POA: Diagnosis not present

## 2013-05-14 DIAGNOSIS — M9981 Other biomechanical lesions of cervical region: Secondary | ICD-10-CM | POA: Diagnosis not present

## 2013-05-14 DIAGNOSIS — M999 Biomechanical lesion, unspecified: Secondary | ICD-10-CM | POA: Diagnosis not present

## 2013-05-14 DIAGNOSIS — M545 Low back pain, unspecified: Secondary | ICD-10-CM | POA: Diagnosis not present

## 2013-05-14 DIAGNOSIS — M542 Cervicalgia: Secondary | ICD-10-CM | POA: Diagnosis not present

## 2013-06-15 DIAGNOSIS — M9981 Other biomechanical lesions of cervical region: Secondary | ICD-10-CM | POA: Diagnosis not present

## 2013-06-15 DIAGNOSIS — M542 Cervicalgia: Secondary | ICD-10-CM | POA: Diagnosis not present

## 2013-06-15 DIAGNOSIS — M545 Low back pain, unspecified: Secondary | ICD-10-CM | POA: Diagnosis not present

## 2013-06-15 DIAGNOSIS — M999 Biomechanical lesion, unspecified: Secondary | ICD-10-CM | POA: Diagnosis not present

## 2013-10-22 DIAGNOSIS — Z23 Encounter for immunization: Secondary | ICD-10-CM | POA: Diagnosis not present

## 2013-11-30 DIAGNOSIS — M542 Cervicalgia: Secondary | ICD-10-CM | POA: Diagnosis not present

## 2013-11-30 DIAGNOSIS — M9903 Segmental and somatic dysfunction of lumbar region: Secondary | ICD-10-CM | POA: Diagnosis not present

## 2013-11-30 DIAGNOSIS — M545 Low back pain: Secondary | ICD-10-CM | POA: Diagnosis not present

## 2013-11-30 DIAGNOSIS — M9901 Segmental and somatic dysfunction of cervical region: Secondary | ICD-10-CM | POA: Diagnosis not present

## 2013-12-14 DIAGNOSIS — M542 Cervicalgia: Secondary | ICD-10-CM | POA: Diagnosis not present

## 2013-12-14 DIAGNOSIS — M9901 Segmental and somatic dysfunction of cervical region: Secondary | ICD-10-CM | POA: Diagnosis not present

## 2013-12-14 DIAGNOSIS — M9903 Segmental and somatic dysfunction of lumbar region: Secondary | ICD-10-CM | POA: Diagnosis not present

## 2013-12-14 DIAGNOSIS — M545 Low back pain: Secondary | ICD-10-CM | POA: Diagnosis not present

## 2013-12-28 DIAGNOSIS — M9903 Segmental and somatic dysfunction of lumbar region: Secondary | ICD-10-CM | POA: Diagnosis not present

## 2013-12-28 DIAGNOSIS — M542 Cervicalgia: Secondary | ICD-10-CM | POA: Diagnosis not present

## 2013-12-28 DIAGNOSIS — M9901 Segmental and somatic dysfunction of cervical region: Secondary | ICD-10-CM | POA: Diagnosis not present

## 2013-12-28 DIAGNOSIS — M545 Low back pain: Secondary | ICD-10-CM | POA: Diagnosis not present

## 2014-01-11 DIAGNOSIS — M9903 Segmental and somatic dysfunction of lumbar region: Secondary | ICD-10-CM | POA: Diagnosis not present

## 2014-01-11 DIAGNOSIS — M542 Cervicalgia: Secondary | ICD-10-CM | POA: Diagnosis not present

## 2014-01-11 DIAGNOSIS — M545 Low back pain: Secondary | ICD-10-CM | POA: Diagnosis not present

## 2014-01-11 DIAGNOSIS — M9901 Segmental and somatic dysfunction of cervical region: Secondary | ICD-10-CM | POA: Diagnosis not present

## 2014-01-25 DIAGNOSIS — M9903 Segmental and somatic dysfunction of lumbar region: Secondary | ICD-10-CM | POA: Diagnosis not present

## 2014-01-25 DIAGNOSIS — M542 Cervicalgia: Secondary | ICD-10-CM | POA: Diagnosis not present

## 2014-01-25 DIAGNOSIS — M545 Low back pain: Secondary | ICD-10-CM | POA: Diagnosis not present

## 2014-01-25 DIAGNOSIS — M9901 Segmental and somatic dysfunction of cervical region: Secondary | ICD-10-CM | POA: Diagnosis not present

## 2014-02-08 ENCOUNTER — Encounter: Payer: Self-pay | Admitting: Cardiovascular Disease

## 2014-02-08 ENCOUNTER — Ambulatory Visit (INDEPENDENT_AMBULATORY_CARE_PROVIDER_SITE_OTHER): Payer: Medicare Other | Admitting: Cardiovascular Disease

## 2014-02-08 VITALS — BP 116/62 | HR 55 | Ht 69.0 in | Wt 200.8 lb

## 2014-02-08 DIAGNOSIS — I2510 Atherosclerotic heart disease of native coronary artery without angina pectoris: Secondary | ICD-10-CM

## 2014-02-08 DIAGNOSIS — M542 Cervicalgia: Secondary | ICD-10-CM | POA: Diagnosis not present

## 2014-02-08 DIAGNOSIS — M9903 Segmental and somatic dysfunction of lumbar region: Secondary | ICD-10-CM | POA: Diagnosis not present

## 2014-02-08 DIAGNOSIS — M545 Low back pain: Secondary | ICD-10-CM | POA: Diagnosis not present

## 2014-02-08 DIAGNOSIS — I10 Essential (primary) hypertension: Secondary | ICD-10-CM | POA: Diagnosis not present

## 2014-02-08 DIAGNOSIS — M9901 Segmental and somatic dysfunction of cervical region: Secondary | ICD-10-CM | POA: Diagnosis not present

## 2014-02-08 DIAGNOSIS — E785 Hyperlipidemia, unspecified: Secondary | ICD-10-CM

## 2014-02-08 DIAGNOSIS — R079 Chest pain, unspecified: Secondary | ICD-10-CM

## 2014-02-08 DIAGNOSIS — I25810 Atherosclerosis of coronary artery bypass graft(s) without angina pectoris: Secondary | ICD-10-CM | POA: Diagnosis not present

## 2014-02-08 MED ORDER — METOPROLOL TARTRATE 25 MG PO TABS: 25.0000 mg | ORAL_TABLET | Freq: Two times a day (BID) | ORAL | Status: DC

## 2014-02-08 MED ORDER — AMLODIPINE BESYLATE 10 MG PO TABS: 10.0000 mg | ORAL_TABLET | Freq: Every day | ORAL | Status: DC

## 2014-02-08 MED ORDER — EZETIMIBE 10 MG PO TABS: 10.0000 mg | ORAL_TABLET | Freq: Every day | ORAL | Status: DC

## 2014-02-08 MED ORDER — BENAZEPRIL HCL 40 MG PO TABS: 40.0000 mg | ORAL_TABLET | Freq: Every day | ORAL | Status: DC

## 2014-02-08 MED ORDER — ATORVASTATIN CALCIUM 10 MG PO TABS: ORAL_TABLET | ORAL | Status: DC

## 2014-02-08 NOTE — Progress Notes (Signed)
Cardiology Office Note   Date:  02/09/2014   ID:  Luke [name] Jun 20, 1930, MRN [id number]  PCP:  Luke Miyamoto, MD  Cardiologist:  Sherren Mocha, MD    Chief Complaint  Patient presents with  . Follow-up    chest tightness     History of Present Illness: Luke [name] is a 81 y.o. male who presents for follow-up evaluation. He is followed for coronary artery disease and underwent multivessel CABG about 14 years ago. His last stress test was in December 2013 and demonstrated small anterolateral infarct with mild peri-infarct ischemia. This was unchanged from his previous nuclear scan in 2010. His left ventricular ejection fraction was normal at 61%.  Things have been difficult since his last visit one year ago. 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 He hasn't been able to exercise in several months. He's developed a chest tightness on occasion. This is unrelated to physical exertion. It does not radiate. This has been self-limited and lasts 5-10 minutes. Pain is unrelated to position, movement, deep breathing, or cough.  Past Medical History  Diagnosis Date  . Atrial fibrillation   . HTN (hypertension)   . CAD (coronary artery disease)      5 with the left internal mamary to the left anterior  descending coronary artery.  . Dyslipidemia   . DVT femoral (deep venous thrombosis) with thrombophlebitis     Past Surgical History  Procedure Laterality Date  . Coronary artery bypass graft  2001    Current Outpatient Prescriptions  Medication Sig Dispense Refill  . amLODipine (NORVASC) 10 MG tablet Take 1 tablet (10 mg total) by mouth daily. 90 tablet 3  . aspirin 81 MG tablet Take 81 mg by mouth daily.      [name] Kitchen atorvastatin (LIPITOR) 10 MG tablet Take one 10 mg tablet by mouth on Monday,Wednesday and Friday. 90 tablet 3  . benazepril (LOTENSIN) 40 MG tablet Take 1 tablet (40 mg total) by mouth daily. 90 tablet 3  . Coenzyme Q10 200 MG TABS Take 1  tablet by mouth daily.    [name] Kitchen ezetimibe (ZETIA) 10 MG tablet Take 1 tablet (10 mg total) by mouth daily. 90 tablet 3  . metoprolol tartrate (LOPRESSOR) 25 MG tablet Take 1 tablet (25 mg total) by mouth 2 (two) times daily. 180 tablet 3  . Multiple Vitamins-Minerals (CENTRUM SILVER PO) 1 tab po qd     . OMEGA-3 FATTY ACIDS PO Take 1,200 mg by mouth 2 (two) times daily.     No current facility-administered medications for this visit.    Allergies:   Iodine and Statins   Social History:  The patient  reports that he has quit smoking. He does not have any smokeless tobacco history on file. He reports that he does not drink alcohol or use illicit drugs.   Family History:  The patient's  family history includes Coronary artery disease in an other family member.    ROS:  Please see the history of present illness.  Otherwise, review of systems is positive for visual problems, depression, back pain, easy bruising, chest pressure, and anxiety.  All other systems are reviewed and negative.    PHYSICAL EXAM: VS:  BP 116/62 mmHg  Pulse 55  Ht 5\' 9"  (1.753 m)  Wt 200 lb 12.8 oz (91.082 kg)  BMI 29.64 kg/m2 , BMI Body mass index is 29.64 kg/(m^2). GEN: Well nourished, well developed, in no acute distress HEENT: normal Neck:  no JVD, carotid bruits, or masses Cardiac: RRR without murmur or gallop                No peripheral edema Respiratory:  clear to auscultation bilaterally, normal work of breathing GI: soft, nontender, nondistended, + BS MS: no deformity or atrophy Skin: warm and dry, no rash Neuro:  Strength and sensation are intact Psych: euthymic mood, full affect  EKG:  EKG is ordered today. The ekg ordered today shows sinus bradycardia 55 bpm, RV conduction delay, nonspecific ST abnormality.  Recent Labs: No results found for requested labs within last 365 days.   Lipid Panel     Component Value Date/Time   CHOL 142 02/02/2013 0819   TRIG 111.0 02/02/2013 0819   TRIG 89  11/15/2005 0820   HDL 33.40* 02/02/2013 0819   CHOLHDL 4 02/02/2013 0819   VLDL 22.2 02/02/2013 0819   LDLCALC 86 02/02/2013 0819   LDLDIRECT 139.9 06/18/2006 1008      Wt Readings from Last 3 Encounters:  02/08/14 200 lb 12.8 oz (91.082 kg)  02/05/13 210 lb (95.255 kg)  12/11/11 205 lb (92.987 kg)     Cardiac Studies Reviewed: Lexiscan Myoview: 12/11/2011 Impression  Exercise Capacity: Lexiscan with no exercise.  BP Response: Normal blood pressure response.  Clinical Symptoms: Stomach Ache  ECG Impression: No significant ST segment change suggestive of ischemia.  Comparison with Prior Nuclear Study: No images to compare  Overall Impression: Low risk stress nuclear study. Small anterolateral infarct with mild peri infarct ischemia Similar to description of 2010 myovue   LV Ejection Fraction: 61%. LV Wall Motion: NL LV Function; NL Wall Motion   ASSESSMENT AND PLAN: 1. Coronary artery disease, native vessel. He is out 15 years from CABG and is having symptoms of chest pain raising some concern for resting angina. I have recommended an exercise stress Myoview scan to evaluate for ischemia. He will continue on his current medical program which was reviewed in detail with him today. Further plans pending the result of his nuclear scan.  2. Hyperlipidemia. The patient is on multidrug therapy with atorvastatin, fish oil, and zetia. He is due for lipids - ordered. I reviewed his results from last year.  3. Hypertension. Blood pressure is well controlled on metoprolol tartrate, amlodipine, and benazepril. We will check a metabolic panel when he comes in for his stress test.  Current medicines are reviewed with the patient today.  The patient does not have concerns regarding medicines.  The following changes have been made:  no change  Labs/ tests ordered today include:   Orders Placed This Encounter  Procedures  . Lipid panel  . Hepatic function panel  . Basic Metabolic  Panel (BMET)  . Myocardial Perfusion Imaging  . EKG 12-Lead    Disposition:   FU with me in one year as long as stress test is low risk.  Signed, Sherren Mocha, MD  02/09/2014 12:25 AM    [facility] [address], [place], [place]  [address] Phone: [phone]; Fax: [phone]

## 2014-02-08 NOTE — Patient Instructions (Signed)
Your physician has requested that you have an exercise stress myoview. For further information please visit [email or web address]. Please follow instruction sheet, as given.  Your physician recommends that you return for a FASTING LIPID, LIVER and BMP--nothing to eat or drink after midnight.  Your physician wants you to follow-up in: 1 YEAR with Dr Burt Knack.  You will receive a reminder letter in the mail two months in advance. If you don't receive a letter, please call our office to schedule the follow-up appointment.  Your physician recommends that you continue on your current medications as directed. Please refer to the Current Medication list given to you today.

## 2014-02-15 ENCOUNTER — Other Ambulatory Visit: Payer: Medicare Other

## 2014-02-15 ENCOUNTER — Encounter (HOSPITAL_COMMUNITY): Payer: Medicare Other

## 2014-02-17 ENCOUNTER — Other Ambulatory Visit (INDEPENDENT_AMBULATORY_CARE_PROVIDER_SITE_OTHER): Payer: Medicare Other | Admitting: *Deleted

## 2014-02-17 ENCOUNTER — Ambulatory Visit (HOSPITAL_COMMUNITY): Payer: Medicare Other | Attending: Cardiology | Admitting: Radiology

## 2014-02-17 DIAGNOSIS — I2510 Atherosclerotic heart disease of native coronary artery without angina pectoris: Secondary | ICD-10-CM | POA: Insufficient documentation

## 2014-02-17 DIAGNOSIS — I10 Essential (primary) hypertension: Secondary | ICD-10-CM

## 2014-02-17 DIAGNOSIS — R079 Chest pain, unspecified: Secondary | ICD-10-CM

## 2014-02-17 LAB — LIPID PANEL
Cholesterol: 150 mg/dL (ref 0–200)
HDL: 38.2 mg/dL — ABNORMAL LOW (ref >39.00)
LDL Cholesterol: 78 mg/dL (ref 0–99)
NonHDL: 111.8
Total CHOL/HDL Ratio: 4
Triglycerides: 167 mg/dL — ABNORMAL HIGH (ref 0.0–149.0)
VLDL: 33.4 mg/dL (ref 0.0–40.0)

## 2014-02-17 LAB — HEPATIC FUNCTION PANEL
ALT: 16 U/L (ref 0–53)
AST: 20 U/L (ref 0–37)
Albumin: 4 g/dL (ref 3.5–5.2)
Alkaline Phosphatase: 39 U/L (ref 39–117)
Bilirubin, Direct: 0.1 mg/dL (ref 0.0–0.3)
Total Bilirubin: 0.5 mg/dL (ref 0.2–1.2)
Total Protein: 6.8 g/dL (ref 6.0–8.3)

## 2014-02-17 LAB — BASIC METABOLIC PANEL
BUN: 22 mg/dL (ref 6–23)
CO2: 28 mEq/L (ref 19–32)
Calcium: 9.3 mg/dL (ref 8.4–10.5)
Chloride: 107 mEq/L (ref 96–112)
Creatinine, Ser: 1.16 mg/dL (ref 0.40–1.50)
GFR: 64.13 mL/min (ref >60.00)
Glucose, Bld: 98 mg/dL (ref 70–99)
Potassium: 3.9 mEq/L (ref 3.5–5.1)
Sodium: 140 mEq/L (ref 135–145)

## 2014-02-17 MED ORDER — TECHNETIUM TC 99M SESTAMIBI GENERIC - CARDIOLITE
10.0000 | Freq: Once | INTRAVENOUS | Status: AC | PRN
  Administered 2014-02-17: 10 via INTRAVENOUS

## 2014-02-17 MED ORDER — TECHNETIUM TC 99M SESTAMIBI GENERIC - CARDIOLITE
30.0000 | Freq: Once | INTRAVENOUS | Status: AC | PRN
  Administered 2014-02-17: 30 via INTRAVENOUS

## 2014-02-17 NOTE — Progress Notes (Signed)
[name] [address] [place], [place] [address] [phone]    Cardiology Nuclear Med Study  Luke [name] is a 81 y.o. male     MRN : [id number]     DOB: 09/05/1930  Procedure Date: 02/17/2014  Nuclear Med Background Indication for Stress Test:  Evaluation for Ischemia and Follow up CAD History:  CAD, MPI (scar) EF 61%, Afib Cardiac Risk Factors: Hypertension  Symptoms:  Chest Pain   Nuclear Pre-Procedure Caffeine/Decaff Intake:  None NPO After: 7:00pm   Lungs:  clear O2 Sat: 94% on room air. IV 0.9% NS with Angio Cath:  22g  IV Site: R Hand  IV Started by:  Crissie Figures, RN  Chest Size (in):  44 Cup Size: n/a  Height: 5\' 9"  (1.753 m)  Weight:  200 lb (90.719 kg)  BMI:  Body mass index is 29.52 kg/(m^2). Tech Comments:  Lopressor held x 24 hrs    Nuclear Med Study 1 or 2 day study: 1 day  Stress Test Type:  Stress  Reading MD: N/A  Order Authorizing Provider:  Sherren Mocha, MD  Resting Radionuclide: Technetium 66m Sestamibi  Resting Radionuclide Dose: 11.0 mCi   Stress Radionuclide:  Technetium 6m Sestamibi  Stress Radionuclide Dose: 33.0 mCi           Stress Protocol Rest HR: 84 Stress HR: 150  Rest BP: 161/81 Stress BP: 194/82  Exercise Time (min): 6:00 METS: 4.6   Predicted Max HR: 139 bpm % Max HR: 107.91 bpm Rate Pressure Product: 29100   Dose of Adenosine (mg):  n/a Dose of Lexiscan: n/a mg  Dose of Atropine (mg): n/a Dose of Dobutamine: n/a mcg/kg/min (at max HR)  Stress Test Technologist: Glade Lloyd, BS-ES  Nuclear Technologist:  Earl Many, CNMT     Rest Procedure:  Myocardial perfusion imaging was performed at rest 45 minutes following the intravenous administration of Technetium 37m Sestamibi. Rest ECG: NSR - Normal EKG  Stress Procedure:  The patient exercised on the treadmill utilizing the Bruce Protocol for 6:00 minutes. The patient stopped due to fatigue and denied any chest pain.  Technetium 53m  Sestamibi was injected at peak exercise and myocardial perfusion imaging was performed after a brief delay. Stress ECG: No significant change from baseline ECG, very frequent PACs  QPS Raw Data Images:  Normal; no motion artifact; normal heart/lung ratio. Stress Images:  Decreased uptake in the basal and mid anterolateral and apical lateral walls.  Rest Images:  Decreased uptake in the basal and mid anterolateral and apical lateral walls.  Subtraction (SDS):  No evidence of ischemia. Transient Ischemic Dilatation (Normal <1.22):  0.89 Lung/Heart Ratio (Normal <0.45):  0.27  Quantitative Gated Spect Images QGS EDV:  94 ml QGS ESV:  35 ml  Impression Exercise Capacity:  Good exercise capacity. BP Response:  Hypertensive blood pressure response. Clinical Symptoms:  No significant symptoms noted. ECG Impression:  No significant ST segment change suggestive of ischemia. Comparison with Prior Nuclear Study: No significant change from previous study  Overall Impression:  Low risk stress nuclear study with a medium size scar in the [place] territory with no ischemia. .  LV Ejection Fraction: 63%.  LV Wall Motion:  Hypokinesis is the basal and mid anterolateral wall.     [name] 02/17/2014

## 2014-02-23 ENCOUNTER — Encounter: Payer: Self-pay | Admitting: Cardiovascular Disease

## 2014-02-23 NOTE — Telephone Encounter (Signed)
This encounter was created in error - please disregard.

## 2014-02-23 NOTE — Telephone Encounter (Signed)
New Message         Pt calling to get his results from his stress test. Please call back and advise.

## 2014-03-08 DIAGNOSIS — M542 Cervicalgia: Secondary | ICD-10-CM | POA: Diagnosis not present

## 2014-03-08 DIAGNOSIS — M9903 Segmental and somatic dysfunction of lumbar region: Secondary | ICD-10-CM | POA: Diagnosis not present

## 2014-03-08 DIAGNOSIS — M545 Low back pain: Secondary | ICD-10-CM | POA: Diagnosis not present

## 2014-03-08 DIAGNOSIS — M9901 Segmental and somatic dysfunction of cervical region: Secondary | ICD-10-CM | POA: Diagnosis not present

## 2014-05-04 DIAGNOSIS — M9903 Segmental and somatic dysfunction of lumbar region: Secondary | ICD-10-CM | POA: Diagnosis not present

## 2014-05-04 DIAGNOSIS — M47816 Spondylosis without myelopathy or radiculopathy, lumbar region: Secondary | ICD-10-CM | POA: Diagnosis not present

## 2014-05-04 DIAGNOSIS — M545 Low back pain: Secondary | ICD-10-CM | POA: Diagnosis not present

## 2014-05-04 DIAGNOSIS — M9904 Segmental and somatic dysfunction of sacral region: Secondary | ICD-10-CM | POA: Diagnosis not present

## 2014-05-11 DIAGNOSIS — M9903 Segmental and somatic dysfunction of lumbar region: Secondary | ICD-10-CM | POA: Diagnosis not present

## 2014-05-11 DIAGNOSIS — M9904 Segmental and somatic dysfunction of sacral region: Secondary | ICD-10-CM | POA: Diagnosis not present

## 2014-05-11 DIAGNOSIS — M545 Low back pain: Secondary | ICD-10-CM | POA: Diagnosis not present

## 2014-05-11 DIAGNOSIS — M47816 Spondylosis without myelopathy or radiculopathy, lumbar region: Secondary | ICD-10-CM | POA: Diagnosis not present

## 2014-05-18 DIAGNOSIS — M9904 Segmental and somatic dysfunction of sacral region: Secondary | ICD-10-CM | POA: Diagnosis not present

## 2014-05-18 DIAGNOSIS — M47816 Spondylosis without myelopathy or radiculopathy, lumbar region: Secondary | ICD-10-CM | POA: Diagnosis not present

## 2014-05-18 DIAGNOSIS — M9903 Segmental and somatic dysfunction of lumbar region: Secondary | ICD-10-CM | POA: Diagnosis not present

## 2014-05-18 DIAGNOSIS — M545 Low back pain: Secondary | ICD-10-CM | POA: Diagnosis not present

## 2014-05-21 DIAGNOSIS — Z961 Presence of intraocular lens: Secondary | ICD-10-CM | POA: Diagnosis not present

## 2014-05-25 DIAGNOSIS — M47816 Spondylosis without myelopathy or radiculopathy, lumbar region: Secondary | ICD-10-CM | POA: Diagnosis not present

## 2014-05-25 DIAGNOSIS — M9904 Segmental and somatic dysfunction of sacral region: Secondary | ICD-10-CM | POA: Diagnosis not present

## 2014-05-25 DIAGNOSIS — M545 Low back pain: Secondary | ICD-10-CM | POA: Diagnosis not present

## 2014-05-25 DIAGNOSIS — M9903 Segmental and somatic dysfunction of lumbar region: Secondary | ICD-10-CM | POA: Diagnosis not present

## 2014-06-01 DIAGNOSIS — M9903 Segmental and somatic dysfunction of lumbar region: Secondary | ICD-10-CM | POA: Diagnosis not present

## 2014-06-01 DIAGNOSIS — M545 Low back pain: Secondary | ICD-10-CM | POA: Diagnosis not present

## 2014-06-01 DIAGNOSIS — M47816 Spondylosis without myelopathy or radiculopathy, lumbar region: Secondary | ICD-10-CM | POA: Diagnosis not present

## 2014-06-01 DIAGNOSIS — M9904 Segmental and somatic dysfunction of sacral region: Secondary | ICD-10-CM | POA: Diagnosis not present

## 2014-06-22 DIAGNOSIS — M47816 Spondylosis without myelopathy or radiculopathy, lumbar region: Secondary | ICD-10-CM | POA: Diagnosis not present

## 2014-06-22 DIAGNOSIS — M545 Low back pain: Secondary | ICD-10-CM | POA: Diagnosis not present

## 2014-06-22 DIAGNOSIS — M9904 Segmental and somatic dysfunction of sacral region: Secondary | ICD-10-CM | POA: Diagnosis not present

## 2014-06-22 DIAGNOSIS — M9903 Segmental and somatic dysfunction of lumbar region: Secondary | ICD-10-CM | POA: Diagnosis not present

## 2014-06-28 DIAGNOSIS — M9903 Segmental and somatic dysfunction of lumbar region: Secondary | ICD-10-CM | POA: Diagnosis not present

## 2014-06-28 DIAGNOSIS — M47816 Spondylosis without myelopathy or radiculopathy, lumbar region: Secondary | ICD-10-CM | POA: Diagnosis not present

## 2014-06-28 DIAGNOSIS — M545 Low back pain: Secondary | ICD-10-CM | POA: Diagnosis not present

## 2014-06-28 DIAGNOSIS — M9904 Segmental and somatic dysfunction of sacral region: Secondary | ICD-10-CM | POA: Diagnosis not present

## 2014-07-08 DIAGNOSIS — M9903 Segmental and somatic dysfunction of lumbar region: Secondary | ICD-10-CM | POA: Diagnosis not present

## 2014-07-08 DIAGNOSIS — M545 Low back pain: Secondary | ICD-10-CM | POA: Diagnosis not present

## 2014-07-08 DIAGNOSIS — M9904 Segmental and somatic dysfunction of sacral region: Secondary | ICD-10-CM | POA: Diagnosis not present

## 2014-07-08 DIAGNOSIS — M47816 Spondylosis without myelopathy or radiculopathy, lumbar region: Secondary | ICD-10-CM | POA: Diagnosis not present

## 2014-07-27 DIAGNOSIS — M545 Low back pain: Secondary | ICD-10-CM | POA: Diagnosis not present

## 2014-07-27 DIAGNOSIS — M9904 Segmental and somatic dysfunction of sacral region: Secondary | ICD-10-CM | POA: Diagnosis not present

## 2014-07-27 DIAGNOSIS — M9903 Segmental and somatic dysfunction of lumbar region: Secondary | ICD-10-CM | POA: Diagnosis not present

## 2014-07-27 DIAGNOSIS — M47816 Spondylosis without myelopathy or radiculopathy, lumbar region: Secondary | ICD-10-CM | POA: Diagnosis not present

## 2014-08-12 DIAGNOSIS — D485 Neoplasm of uncertain behavior of skin: Secondary | ICD-10-CM | POA: Diagnosis not present

## 2014-08-12 DIAGNOSIS — L819 Disorder of pigmentation, unspecified: Secondary | ICD-10-CM | POA: Diagnosis not present

## 2014-08-12 DIAGNOSIS — C44529 Squamous cell carcinoma of skin of other part of trunk: Secondary | ICD-10-CM | POA: Diagnosis not present

## 2014-08-12 DIAGNOSIS — L570 Actinic keratosis: Secondary | ICD-10-CM | POA: Diagnosis not present

## 2014-08-12 DIAGNOSIS — D225 Melanocytic nevi of trunk: Secondary | ICD-10-CM | POA: Diagnosis not present

## 2014-08-12 DIAGNOSIS — L821 Other seborrheic keratosis: Secondary | ICD-10-CM | POA: Diagnosis not present

## 2014-08-19 DIAGNOSIS — M545 Low back pain: Secondary | ICD-10-CM | POA: Diagnosis not present

## 2014-08-19 DIAGNOSIS — M47816 Spondylosis without myelopathy or radiculopathy, lumbar region: Secondary | ICD-10-CM | POA: Diagnosis not present

## 2014-08-19 DIAGNOSIS — M9904 Segmental and somatic dysfunction of sacral region: Secondary | ICD-10-CM | POA: Diagnosis not present

## 2014-08-19 DIAGNOSIS — M9903 Segmental and somatic dysfunction of lumbar region: Secondary | ICD-10-CM | POA: Diagnosis not present

## 2014-09-02 DIAGNOSIS — M9903 Segmental and somatic dysfunction of lumbar region: Secondary | ICD-10-CM | POA: Diagnosis not present

## 2014-09-02 DIAGNOSIS — M9904 Segmental and somatic dysfunction of sacral region: Secondary | ICD-10-CM | POA: Diagnosis not present

## 2014-09-02 DIAGNOSIS — M47816 Spondylosis without myelopathy or radiculopathy, lumbar region: Secondary | ICD-10-CM | POA: Diagnosis not present

## 2014-09-02 DIAGNOSIS — M545 Low back pain: Secondary | ICD-10-CM | POA: Diagnosis not present

## 2014-09-09 DIAGNOSIS — M9904 Segmental and somatic dysfunction of sacral region: Secondary | ICD-10-CM | POA: Diagnosis not present

## 2014-09-09 DIAGNOSIS — M9903 Segmental and somatic dysfunction of lumbar region: Secondary | ICD-10-CM | POA: Diagnosis not present

## 2014-09-09 DIAGNOSIS — M545 Low back pain: Secondary | ICD-10-CM | POA: Diagnosis not present

## 2014-09-09 DIAGNOSIS — M47816 Spondylosis without myelopathy or radiculopathy, lumbar region: Secondary | ICD-10-CM | POA: Diagnosis not present

## 2014-10-04 DIAGNOSIS — C44529 Squamous cell carcinoma of skin of other part of trunk: Secondary | ICD-10-CM | POA: Diagnosis not present

## 2014-10-14 DIAGNOSIS — M9903 Segmental and somatic dysfunction of lumbar region: Secondary | ICD-10-CM | POA: Diagnosis not present

## 2014-10-14 DIAGNOSIS — M9904 Segmental and somatic dysfunction of sacral region: Secondary | ICD-10-CM | POA: Diagnosis not present

## 2014-10-14 DIAGNOSIS — M47816 Spondylosis without myelopathy or radiculopathy, lumbar region: Secondary | ICD-10-CM | POA: Diagnosis not present

## 2014-10-14 DIAGNOSIS — M545 Low back pain: Secondary | ICD-10-CM | POA: Diagnosis not present

## 2014-11-04 DIAGNOSIS — M5441 Lumbago with sciatica, right side: Secondary | ICD-10-CM | POA: Diagnosis not present

## 2014-11-04 DIAGNOSIS — M9904 Segmental and somatic dysfunction of sacral region: Secondary | ICD-10-CM | POA: Diagnosis not present

## 2014-11-04 DIAGNOSIS — M9903 Segmental and somatic dysfunction of lumbar region: Secondary | ICD-10-CM | POA: Diagnosis not present

## 2014-11-10 DIAGNOSIS — Z23 Encounter for immunization: Secondary | ICD-10-CM | POA: Diagnosis not present

## 2014-11-15 DIAGNOSIS — L570 Actinic keratosis: Secondary | ICD-10-CM | POA: Diagnosis not present

## 2014-11-23 DIAGNOSIS — M9904 Segmental and somatic dysfunction of sacral region: Secondary | ICD-10-CM | POA: Diagnosis not present

## 2014-11-23 DIAGNOSIS — M9903 Segmental and somatic dysfunction of lumbar region: Secondary | ICD-10-CM | POA: Diagnosis not present

## 2014-11-23 DIAGNOSIS — M5441 Lumbago with sciatica, right side: Secondary | ICD-10-CM | POA: Diagnosis not present

## 2014-12-07 DIAGNOSIS — M9904 Segmental and somatic dysfunction of sacral region: Secondary | ICD-10-CM | POA: Diagnosis not present

## 2014-12-07 DIAGNOSIS — M9903 Segmental and somatic dysfunction of lumbar region: Secondary | ICD-10-CM | POA: Diagnosis not present

## 2014-12-07 DIAGNOSIS — M5441 Lumbago with sciatica, right side: Secondary | ICD-10-CM | POA: Diagnosis not present

## 2014-12-23 DIAGNOSIS — M5441 Lumbago with sciatica, right side: Secondary | ICD-10-CM | POA: Diagnosis not present

## 2014-12-23 DIAGNOSIS — M9903 Segmental and somatic dysfunction of lumbar region: Secondary | ICD-10-CM | POA: Diagnosis not present

## 2014-12-23 DIAGNOSIS — M9904 Segmental and somatic dysfunction of sacral region: Secondary | ICD-10-CM | POA: Diagnosis not present

## 2015-01-13 DIAGNOSIS — M9903 Segmental and somatic dysfunction of lumbar region: Secondary | ICD-10-CM | POA: Diagnosis not present

## 2015-01-13 DIAGNOSIS — M5413 Radiculopathy, cervicothoracic region: Secondary | ICD-10-CM | POA: Diagnosis not present

## 2015-01-13 DIAGNOSIS — M5441 Lumbago with sciatica, right side: Secondary | ICD-10-CM | POA: Diagnosis not present

## 2015-01-13 DIAGNOSIS — M9901 Segmental and somatic dysfunction of cervical region: Secondary | ICD-10-CM | POA: Diagnosis not present

## 2015-02-17 DIAGNOSIS — M5441 Lumbago with sciatica, right side: Secondary | ICD-10-CM | POA: Diagnosis not present

## 2015-02-17 DIAGNOSIS — M9903 Segmental and somatic dysfunction of lumbar region: Secondary | ICD-10-CM | POA: Diagnosis not present

## 2015-02-17 DIAGNOSIS — M5413 Radiculopathy, cervicothoracic region: Secondary | ICD-10-CM | POA: Diagnosis not present

## 2015-02-17 DIAGNOSIS — M9901 Segmental and somatic dysfunction of cervical region: Secondary | ICD-10-CM | POA: Diagnosis not present

## 2015-02-23 ENCOUNTER — Other Ambulatory Visit: Payer: Self-pay | Admitting: Cardiovascular Disease

## 2015-02-23 DIAGNOSIS — E785 Hyperlipidemia, unspecified: Secondary | ICD-10-CM

## 2015-02-23 MED ORDER — EZETIMIBE 10 MG PO TABS: 10.0000 mg | ORAL_TABLET | Freq: Every day | ORAL | Status: DC

## 2015-02-28 ENCOUNTER — Encounter: Payer: Self-pay | Admitting: Cardiovascular Disease

## 2015-02-28 ENCOUNTER — Ambulatory Visit (INDEPENDENT_AMBULATORY_CARE_PROVIDER_SITE_OTHER): Payer: Medicare Other | Admitting: Cardiovascular Disease

## 2015-02-28 VITALS — BP 154/70 | HR 54 | Ht 69.0 in | Wt 204.8 lb

## 2015-02-28 DIAGNOSIS — E785 Hyperlipidemia, unspecified: Secondary | ICD-10-CM | POA: Diagnosis not present

## 2015-02-28 DIAGNOSIS — I10 Essential (primary) hypertension: Secondary | ICD-10-CM | POA: Diagnosis not present

## 2015-02-28 DIAGNOSIS — I25810 Atherosclerosis of coronary artery bypass graft(s) without angina pectoris: Secondary | ICD-10-CM | POA: Diagnosis not present

## 2015-02-28 LAB — LIPID PANEL
Cholesterol: 148 mg/dL (ref 125–200)
HDL: 35 mg/dL — ABNORMAL LOW (ref >=40)
LDL Cholesterol: 90 mg/dL (ref <130)
Total CHOL/HDL Ratio: 4.2 Ratio (ref <=5.0)
Triglycerides: 114 mg/dL (ref <150)
VLDL: 23 mg/dL (ref <30)

## 2015-02-28 LAB — COMPREHENSIVE METABOLIC PANEL
ALT: 15 U/L (ref 9–46)
AST: 21 U/L (ref 10–35)
Albumin: 4.1 g/dL (ref 3.6–5.1)
Alkaline Phosphatase: 34 U/L — ABNORMAL LOW (ref 40–115)
BUN: 27 mg/dL — ABNORMAL HIGH (ref 7–25)
CO2: 27 mmol/L (ref 20–31)
Calcium: 9.4 mg/dL (ref 8.6–10.3)
Chloride: 104 mmol/L (ref 98–110)
Creat: 1.14 mg/dL — ABNORMAL HIGH (ref 0.70–1.11)
Glucose, Bld: 94 mg/dL (ref 65–99)
Potassium: 4.9 mmol/L (ref 3.5–5.3)
Sodium: 141 mmol/L (ref 135–146)
Total Bilirubin: 0.3 mg/dL (ref 0.2–1.2)
Total Protein: 7.3 g/dL (ref 6.1–8.1)

## 2015-02-28 MED ORDER — BENAZEPRIL HCL 40 MG PO TABS: 40.0000 mg | ORAL_TABLET | Freq: Every day | ORAL | Status: DC

## 2015-02-28 MED ORDER — METOPROLOL TARTRATE 25 MG PO TABS: 25.0000 mg | ORAL_TABLET | Freq: Two times a day (BID) | ORAL | Status: DC

## 2015-02-28 MED ORDER — EZETIMIBE 10 MG PO TABS: 10.0000 mg | ORAL_TABLET | Freq: Every day | ORAL | Status: DC

## 2015-02-28 MED ORDER — AMLODIPINE BESYLATE 10 MG PO TABS: 10.0000 mg | ORAL_TABLET | Freq: Every day | ORAL | Status: DC

## 2015-02-28 MED ORDER — ATORVASTATIN CALCIUM 10 MG PO TABS: ORAL_TABLET | ORAL | Status: DC

## 2015-02-28 NOTE — Patient Instructions (Signed)
Medication Instructions:  Your physician recommends that you continue on your current medications as directed. Please refer to the Current Medication list given to you today.  Labwork: Your physician recommends that you return for a FASTING LIPID and CMP--nothing to eat or drink after midnight, lab opens at 7:30 AM  Testing/Procedures: No new orders.   Follow-Up: Your physician wants you to follow-up in: 1 YEAR with Dr Cooper.  You will receive a reminder letter in the mail two months in advance. If you don't receive a letter, please call our office to schedule the follow-up appointment.   Any Other Special Instructions Will Be Listed Below (If Applicable).     If you need a refill on your cardiac medications before your next appointment, please call your pharmacy.   

## 2015-02-28 NOTE — Progress Notes (Signed)
Cardiology Office Note Date:  02/28/2015   ID:  Jacorey, Donaway 1930-08-08, MRN [id number]  PCP:  Pcp Not In System  Cardiologist:  Sherren Mocha, MD    Chief Complaint  Patient presents with  . Follow-up    CAD s/p CABG   History of Present Illness: Luke [name] is a 82 y.o. male who presents for follow-up evaluation. He underwent remote multivessel CABG approximately 15 years ago. His most recent stress test from one year ago showed no major areas of ischemia. LVEF was gated at 63%.   He's had a lot of stress, caring for his wife who has advanced cirrhosis and frequent medical visits to [facility]. Denies exertional chest pain or pressure. No dyspnea or edema. He's most limited by low back pain.    Past Medical History  Diagnosis Date  . Atrial fibrillation ([place])   . HTN (hypertension)   . CAD (coronary artery disease)      5 with the left internal mamary to the left anterior  descending coronary artery.  . Dyslipidemia   . DVT femoral (deep venous thrombosis) with thrombophlebitis [facility])     Past Surgical History  Procedure Laterality Date  . Coronary artery bypass graft  2001    Current Outpatient Prescriptions  Medication Sig Dispense Refill  . amLODipine (NORVASC) 10 MG tablet Take 1 tablet (10 mg total) by mouth daily. 90 tablet 3  . aspirin 81 MG tablet Take 81 mg by mouth daily.      [name] Kitchen atorvastatin (LIPITOR) 10 MG tablet Take one 10 mg tablet by mouth on Monday,Wednesday and Friday. 45 tablet 3  . benazepril (LOTENSIN) 40 MG tablet Take 1 tablet (40 mg total) by mouth daily. 90 tablet 3  . Coenzyme Q10 200 MG TABS Take 1 tablet by mouth daily.    [name] Kitchen ezetimibe (ZETIA) 10 MG tablet Take 1 tablet (10 mg total) by mouth daily. 90 tablet 3  . metoprolol tartrate (LOPRESSOR) 25 MG tablet Take 1 tablet (25 mg total) by mouth 2 (two) times daily. 180 tablet 3  . Multiple Vitamins-Minerals (CENTRUM SILVER PO) 1 tab po qd     . OMEGA-3 FATTY ACIDS PO Take 1,200 mg by  mouth 2 (two) times daily.     No current facility-administered medications for this visit.    Allergies:   Iodine and Statins   Social History:  The patient  reports that he has quit smoking. He has never used smokeless tobacco. He reports that he does not drink alcohol or use illicit drugs.   Family History:  The patient's family history includes Cancer in his mother; Heart attack in his father; Heart disease in his father and mother.   ROS:  Please see the history of present illness.  Otherwise, review of systems is positive for low back pain, left arm numbness, chest pain, visual disturbance, diarrhea, muscle pain, easy bruising, fatigue, leg pain, anxiety.  All other systems are reviewed and negative.   PHYSICAL EXAM: VS:  BP 154/70 mmHg  Pulse 54  Ht 5' 9" (1.753 m)  Wt 92.897 kg (204 lb 12.8 oz)  BMI 30.23 kg/m2 , BMI Body mass index is 30.23 kg/(m^2). GEN: Well nourished, well developed, pleasant overweight male in no acute distress HEENT: normal Neck: no JVD, no masses. No carotid bruits Cardiac: RRR without murmur or gallop                Respiratory:  clear to auscultation bilaterally, normal work  of breathing GI: soft, nontender, nondistended, + BS MS: no deformity or atrophy Ext: no pretibial edema, pedal pulses 2+= bilaterally Skin: warm and dry, no rash Neuro:  Strength and sensation are intact Psych: euthymic mood, full affect  EKG:  EKG is ordered today. The ekg ordered today shows sinus bradycardia with first degree AV block, incomplete RBBB  Recent Labs: No results found for requested labs within last 365 days.   Lipid Panel     Component Value Date/Time   CHOL 150 02/17/2014 0737   TRIG 167.0* 02/17/2014 0737   TRIG 89 11/15/2005 0820   HDL 38.20* 02/17/2014 0737   CHOLHDL 4 02/17/2014 0737   CHOLHDL 3.5 CALC 11/15/2005 0820   VLDL 33.4 02/17/2014 0737   LDLCALC 78 02/17/2014 0737   LDLDIRECT 139.9 06/18/2006 1008   Wt Readings from Last 3  Encounters:  02/28/15 92.897 kg (204 lb 12.8 oz)  02/17/14 90.719 kg (200 lb)  02/08/14 91.082 kg (200 lb 12.8 oz)    Cardiac Studies Reviewed: Nuclear Stress Study 02/17/2014: Impression Exercise Capacity: Good exercise capacity. BP Response: Hypertensive blood pressure response. Clinical Symptoms: No significant symptoms noted. ECG Impression: No significant ST segment change suggestive of ischemia. Comparison with Prior Nuclear Study: No significant change from previous study  Overall Impression: Low risk stress nuclear study with a medium size scar in the [place] territory with no ischemia. .  LV Ejection Fraction: 63%. LV Wall Motion: Hypokinesis is the basal and mid anterolateral wall.   ASSESSMENT AND PLAN: 1.  CAD, native vessel, without angina: low-risk Myoview last year. No new symptoms. Medications reviewed and no changes recommended.   2. Essential HTN: BP is up and down. Will continue same Rx for now.  He reports a significant stress component.   3. Hyperlipidemia: continue atorvastatin  Current medicines are reviewed with the patient today.  The patient does not have concerns regarding medicines.  Labs/ tests ordered today include:   Orders Placed This Encounter  Procedures  . Lipid panel  . Comp Met (CMET)  . EKG 12-Lead    Disposition:   FU one year  Signed, Sherren Mocha, MD  02/28/2015 5:50 PM    [facility] [place], [place], [place]  [address] Phone: [phone]; Fax: [phone]

## 2015-03-03 DIAGNOSIS — M9901 Segmental and somatic dysfunction of cervical region: Secondary | ICD-10-CM | POA: Diagnosis not present

## 2015-03-03 DIAGNOSIS — M5413 Radiculopathy, cervicothoracic region: Secondary | ICD-10-CM | POA: Diagnosis not present

## 2015-03-03 DIAGNOSIS — M5441 Lumbago with sciatica, right side: Secondary | ICD-10-CM | POA: Diagnosis not present

## 2015-03-03 DIAGNOSIS — M9903 Segmental and somatic dysfunction of lumbar region: Secondary | ICD-10-CM | POA: Diagnosis not present

## 2015-03-14 DIAGNOSIS — M9901 Segmental and somatic dysfunction of cervical region: Secondary | ICD-10-CM | POA: Diagnosis not present

## 2015-03-14 DIAGNOSIS — M5413 Radiculopathy, cervicothoracic region: Secondary | ICD-10-CM | POA: Diagnosis not present

## 2015-03-14 DIAGNOSIS — M9903 Segmental and somatic dysfunction of lumbar region: Secondary | ICD-10-CM | POA: Diagnosis not present

## 2015-03-14 DIAGNOSIS — M5441 Lumbago with sciatica, right side: Secondary | ICD-10-CM | POA: Diagnosis not present

## 2015-04-04 DIAGNOSIS — M9903 Segmental and somatic dysfunction of lumbar region: Secondary | ICD-10-CM | POA: Diagnosis not present

## 2015-04-04 DIAGNOSIS — M5441 Lumbago with sciatica, right side: Secondary | ICD-10-CM | POA: Diagnosis not present

## 2015-04-04 DIAGNOSIS — M5413 Radiculopathy, cervicothoracic region: Secondary | ICD-10-CM | POA: Diagnosis not present

## 2015-04-04 DIAGNOSIS — M9901 Segmental and somatic dysfunction of cervical region: Secondary | ICD-10-CM | POA: Diagnosis not present

## 2015-04-16 ENCOUNTER — Other Ambulatory Visit: Payer: Self-pay | Admitting: Cardiovascular Disease

## 2015-04-18 NOTE — Telephone Encounter (Signed)
ezetimibe (ZETIA) 10 MG tablet  Medication   Date: 02/28/2015  Department: [facility] Office  Ordering/Authorizing: Sherren Mocha, MD      Order Providers    Prescribing Provider Encounter Provider   Sherren Mocha, MD Sherren Mocha, MD    Medication Detail      Disp Refills Start End     ezetimibe (ZETIA) 10 MG tablet 90 tablet 3 02/28/2015     Sig - Route: Take 1 tablet (10 mg total) by mouth daily. - Oral    E-Prescribing Status: Receipt confirmed by pharmacy (02/28/2015 4:05 PM EST)     Associated Diagnoses    Hyperlipidemia       Pharmacy    [place], [place]

## 2015-05-09 DIAGNOSIS — M5413 Radiculopathy, cervicothoracic region: Secondary | ICD-10-CM | POA: Diagnosis not present

## 2015-05-09 DIAGNOSIS — M5441 Lumbago with sciatica, right side: Secondary | ICD-10-CM | POA: Diagnosis not present

## 2015-05-09 DIAGNOSIS — M9903 Segmental and somatic dysfunction of lumbar region: Secondary | ICD-10-CM | POA: Diagnosis not present

## 2015-05-09 DIAGNOSIS — M9901 Segmental and somatic dysfunction of cervical region: Secondary | ICD-10-CM | POA: Diagnosis not present

## 2015-06-29 ENCOUNTER — Encounter (HOSPITAL_BASED_OUTPATIENT_CLINIC_OR_DEPARTMENT_OTHER): Payer: Self-pay | Admitting: Emergency Medicine

## 2015-06-29 ENCOUNTER — Emergency Department (HOSPITAL_BASED_OUTPATIENT_CLINIC_OR_DEPARTMENT_OTHER)
Admission: EM | Admit: 2015-06-29 | Discharge: 2015-06-29 | Disposition: A | Discharge status: Home / Self Care | Payer: Medicare Other | Attending: Emergency Medicine | Admitting: Emergency Medicine

## 2015-06-29 ENCOUNTER — Emergency Department (HOSPITAL_BASED_OUTPATIENT_CLINIC_OR_DEPARTMENT_OTHER): Payer: Medicare Other

## 2015-06-29 DIAGNOSIS — L03115 Cellulitis of right lower limb: Secondary | ICD-10-CM | POA: Diagnosis not present

## 2015-06-29 DIAGNOSIS — I4891 Unspecified atrial fibrillation: Secondary | ICD-10-CM | POA: Diagnosis not present

## 2015-06-29 DIAGNOSIS — I2510 Atherosclerotic heart disease of native coronary artery without angina pectoris: Secondary | ICD-10-CM | POA: Diagnosis not present

## 2015-06-29 DIAGNOSIS — M79671 Pain in right foot: Secondary | ICD-10-CM

## 2015-06-29 DIAGNOSIS — E785 Hyperlipidemia, unspecified: Secondary | ICD-10-CM | POA: Insufficient documentation

## 2015-06-29 DIAGNOSIS — Z7982 Long term (current) use of aspirin: Secondary | ICD-10-CM | POA: Diagnosis not present

## 2015-06-29 DIAGNOSIS — L03116 Cellulitis of left lower limb: Secondary | ICD-10-CM | POA: Diagnosis not present

## 2015-06-29 DIAGNOSIS — I10 Essential (primary) hypertension: Secondary | ICD-10-CM | POA: Insufficient documentation

## 2015-06-29 DIAGNOSIS — Z87891 Personal history of nicotine dependence: Secondary | ICD-10-CM | POA: Insufficient documentation

## 2015-06-29 DIAGNOSIS — M7989 Other specified soft tissue disorders: Secondary | ICD-10-CM | POA: Diagnosis not present

## 2015-06-29 DIAGNOSIS — Z79899 Other long term (current) drug therapy: Secondary | ICD-10-CM | POA: Diagnosis not present

## 2015-06-29 MED ORDER — DOXYCYCLINE HYCLATE 100 MG PO CAPS: 100.0000 mg | ORAL_CAPSULE | Freq: Two times a day (BID) | ORAL | Status: DC

## 2015-06-29 MED FILL — DOXYCYCLINE HYC 100 MG CAP: 100 | 7 days supply | Qty: 14 | Fill #0

## 2015-06-29 NOTE — ED Notes (Signed)
PA at the bedside.

## 2015-06-29 NOTE — ED Notes (Signed)
Patient transported to Ultrasound 

## 2015-06-29 NOTE — ED Provider Notes (Signed)
CSN: [id number]     Arrival date & time 06/29/15  1020 History   First MD Initiated Contact with Patient 06/29/15 1032     Chief Complaint  Patient presents with  . Foot Pain     (Consider location/radiation/quality/duration/timing/severity/associated sxs/prior Treatment) HPI Comments: Luke [name] is a 82 y.o. male with a PMHx of Afib (rate controlled with metoprolol), HTN, CAD s/p CABG, HLD, and remote DVT with thombophlebitis, who presents to the ED with complaints of right foot pain and swelling 3 days which has gradually worsened. He describes the pain is 2/10 constant sharp nonradiating right foot pain across the entire dorsal aspect of the foot, worse with weightbearing and movement of the foot, and no treatments tried prior to arrival. Associated symptoms include swelling, redness, and tingling in the foot. He denies any warmth, history of gout, insect bites or abrasions/injuries to the foot. Additionally he denies any fevers, chills, chest pain, shortness breath, abdominal pain, nausea, vomiting, diarrhea, constipation, dysuria, hematuria, numbness, or focal weakness. He denies any recent travel/surgery/immobilization.  He states he had a DVT years ago, believes it was in 2013, his daughter at bedside states that he was on Coumadin for several months, patient believes it was ~5-6 months and that he was not put back on it because he did not like the side effects, unclear whether this was also because he didn't need to be placed back on Coumadin. He seems to confuse the "blood thinner" category with the "statins" because he describes having side effects from statins when tells me why he "wouldn't let them" put him back on blood thinners.   Patient is a 82 y.o. male presenting with lower extremity pain. The history is provided by the patient and medical records. No language interpreter was used.  Foot Pain This is a new problem. The current episode started in the past 7 days. The problem  occurs constantly. The problem has been gradually worsening. Associated symptoms include arthralgias (R foot) and joint swelling (R foot). Pertinent negatives include no abdominal pain, chest pain, chills, fever, myalgias, nausea, numbness, urinary symptoms, vomiting or weakness. The symptoms are aggravated by walking and standing. He has tried nothing for the symptoms. The treatment provided no relief.    Past Medical History  Diagnosis Date  . Atrial fibrillation ([place])   . HTN (hypertension)   . CAD (coronary artery disease)      5 with the left internal mamary to the left anterior  descending coronary artery.  . Dyslipidemia   . DVT femoral (deep venous thrombosis) with thrombophlebitis [facility])    Past Surgical History  Procedure Laterality Date  . Coronary artery bypass graft  2001   Family History  Problem Relation Age of Onset  . Coronary artery disease    . Heart disease Mother   . Cancer Mother   . Heart disease Father   . Heart attack Father    Social History  Substance Use Topics  . Smoking status: Former Research scientist (life sciences)  . Smokeless tobacco: Never Used     Comment: stopped in the 1950's  . Alcohol Use: No    Review of Systems  Constitutional: Negative for fever and chills.  Respiratory: Negative for shortness of breath.   Cardiovascular: Negative for chest pain.  Gastrointestinal: Negative for nausea, vomiting, abdominal pain, diarrhea and constipation.  Genitourinary: Negative for dysuria and hematuria.  Musculoskeletal: Positive for joint swelling (R foot) and arthralgias (R foot). Negative for myalgias.  Skin: Positive for color change (  R foot redness). Negative for wound.  Allergic/Immunologic: Negative for immunocompromised state.  Neurological: Negative for weakness and numbness.       +tingling in the R foot  Psychiatric/Behavioral: Negative for confusion.   10 Systems reviewed and are negative for acute change except as noted in the HPI.    Allergies  Iodine  and Statins  Home Medications   Prior to Admission medications   Medication Sig Start Date End Date Taking? Authorizing Provider  amLODipine (NORVASC) 10 MG tablet Take 1 tablet (10 mg total) by mouth daily. 02/28/15   Sherren Mocha, MD  aspirin 81 MG tablet Take 81 mg by mouth daily.      Historical Provider, MD  atorvastatin (LIPITOR) 10 MG tablet Take one 10 mg tablet by mouth on Monday,Wednesday and Friday. 02/28/15   Sherren Mocha, MD  benazepril (LOTENSIN) 40 MG tablet Take 1 tablet (40 mg total) by mouth daily. 02/28/15   Sherren Mocha, MD  Coenzyme Q10 200 MG TABS Take 1 tablet by mouth daily.    Historical Provider, MD  ezetimibe (ZETIA) 10 MG tablet Take 1 tablet (10 mg total) by mouth daily. 02/28/15   Sherren Mocha, MD  metoprolol tartrate (LOPRESSOR) 25 MG tablet Take 1 tablet (25 mg total) by mouth 2 (two) times daily. 02/28/15   Sherren Mocha, MD  Multiple Vitamins-Minerals (CENTRUM SILVER PO) 1 tab po qd     Historical Provider, MD  OMEGA-3 FATTY ACIDS PO Take 1,200 mg by mouth 2 (two) times daily.    Historical Provider, MD   BP 138/62 mmHg  Pulse 57  Temp(Src) 98.3 F (36.8 C) (Oral)  Resp 18  Ht 5\' 9"  (1.753 m)  Wt 90.719 kg  BMI 29.52 kg/m2  SpO2 98% Physical Exam  Constitutional: He is oriented to person, place, and time. Vital signs are normal. He appears well-developed and well-nourished.  Non-toxic appearance. No distress.  Afebrile, nontoxic, NAD  HENT:  Head: Normocephalic and atraumatic.  Mouth/Throat: Oropharynx is clear and moist and mucous membranes are normal.  Eyes: Conjunctivae and EOM are normal. Right eye exhibits no discharge. Left eye exhibits no discharge.  Neck: Normal range of motion. Neck supple.  Cardiovascular: Regular rhythm, normal heart sounds and intact distal pulses.  Bradycardia present.  Exam reveals no gallop and no friction rub.   No murmur heard. HR 57 which is baseline for pt, reg rhythm, nl s1/s2, no m/r/g, distal pulses intact   Pulmonary/Chest: Effort normal and breath sounds normal. No respiratory distress. He has no decreased breath sounds. He has no wheezes. He has no rhonchi. He has no rales.  Abdominal: Soft. Normal appearance and bowel sounds are normal. He exhibits no distension. There is no tenderness. There is no rigidity, no rebound, no guarding and no CVA tenderness.  Musculoskeletal: Normal range of motion.       Right foot: There is tenderness and swelling. There is normal range of motion, normal capillary refill, no crepitus, no deformity and no laceration.       Feet:  R foot with minimal TTP to the lateral aspect of the metatarsal region, with swelling extending from the MTP joint region up to just below the ankle, mildly erythematous and warm to touch, with skin intact, no drainage. No focal joint tenderness or swelling/warmth. FROM intact in all digits and at the ankle. No crepitus or effusions, no deformities. Strength and sensation grossly intact, distal pulses intact, soft compartments.  Calf without swelling or asymmetry, no erythema or  warmth extending past the foot/ankle. Neg homan's bilaterally.   Neurological: He is alert and oriented to person, place, and time. He has normal strength. No sensory deficit.  Skin: Skin is warm, dry and intact. No rash noted. There is erythema.  R foot erythema/warmth/swelling as noted above  Psychiatric: He has a normal mood and affect.  Nursing note and vitals reviewed.   ED Course  Procedures (including critical care time) Labs Review Labs Reviewed - No data to display  Imaging Review US Venous Img Lower Unilateral Right  06/29/2015  CLINICAL DATA:  82 year old male complaining of right-sided foot pain and swelling. Redness and warmth to the touch for the past 3 days. History of prior deep venous thrombosis in 2004. EXAM: RIGHT LOWER EXTREMITY VENOUS DOPPLER ULTRASOUND TECHNIQUE: Gray-scale sonography with graded compression, as well as color Doppler and  duplex ultrasound were performed to evaluate the lower extremity deep venous systems from the level of the common femoral vein and including the common femoral, femoral, profunda femoral, popliteal and calf veins including the posterior tibial, peroneal and gastrocnemius veins when visible. The superficial great saphenous vein was also interrogated. Spectral Doppler was utilized to evaluate flow at rest and with distal augmentation maneuvers in the common femoral, femoral and popliteal veins. COMPARISON:  No priors. FINDINGS: Contralateral Common Femoral Vein: Respiratory phasicity is normal and symmetric with the symptomatic side. No evidence of thrombus. Normal compressibility. Common Femoral Vein: No evidence of thrombus. Normal compressibility, respiratory phasicity and response to augmentation. Saphenofemoral Junction: No evidence of thrombus. Normal compressibility and flow on color Doppler imaging. Profunda Femoral Vein: No evidence of thrombus. Normal compressibility and flow on color Doppler imaging. Femoral Vein: No evidence of thrombus. Normal compressibility, respiratory phasicity and response to augmentation. Popliteal Vein: No evidence of thrombus. Normal compressibility, respiratory phasicity and response to augmentation. Calf Veins: No evidence of thrombus. Normal compressibility and flow on color Doppler imaging. Superficial Great Saphenous Vein: No evidence of thrombus. Normal compressibility and flow on color Doppler imaging. Venous Reflux:  None. Other Findings:  None. IMPRESSION: No evidence of right lower extremity deep venous thrombosis. Electronically Signed   By: Vinnie Langton M.D.   On: 06/29/2015 11:46   I have personally reviewed and evaluated these images and lab results as part of my medical decision-making.   EKG Interpretation None      MDM   Final diagnoses:  Right foot pain  Cellulitis of right foot    82 y.o. male here with R foot pain x3 days, no injury or  trauma to foot, some tingling, redness, and swelling reported. On exam, NVI with soft compartments, minimal tenderness over the lateral aspect of the foot, with warmth, erythema, and mild swelling noted which extends from the MTP region to the ankle, does not extend into the calf. Neg Homan's bilaterally. DDx includes DVT vs cellulitis. He has remote hx of DVT, treated with coumadin x5-6 months and then discontinued. I cannot find record of this in the system so unclear exact details surrounding the DVT previously. Pt declines wanting pain meds, doubt need for xray imaging, will get U/S of RLE and then reassess after. Doubt need for labs. Discussed case with my attending Dr. Christy Gentles who agrees with plan.   12:47 PM U/S neg for DVT. Will treat as cellulitis, doxy x1wk. F/up with PCP in 3-5 days for recheck. Discussed elevation, ice/heat, and tylenol/motrin for pain. I explained the diagnosis and have given explicit precautions to return to the ER including  for any other new or worsening symptoms. The patient understands and accepts the medical plan as it's been dictated and I have answered their questions. Discharge instructions concerning home care and prescriptions have been given. The patient is STABLE and is discharged to home in good condition.  BP 122/54 mmHg  Pulse 54  Temp(Src) 98.3 F (36.8 C) (Oral)  Resp 16  Ht 5\' 9"  (1.753 m)  Wt 90.719 kg  BMI 29.52 kg/m2  SpO2 99%  Meds ordered this encounter  Medications  . doxycycline (VIBRAMYCIN) 100 MG capsule    Sig: Take 1 capsule (100 mg total) by mouth 2 (two) times daily. One po bid x 7 days    Dispense:  14 capsule    Refill:  0    Order Specific Question:  Supervising Provider    Answer:  Jenny Reichmann Camprubi-Soms, PA-C 06/29/15 1248  Ripley Fraise, MD 06/29/15 1607  Ripley Fraise, MD 06/29/15 [phone]

## 2015-06-29 NOTE — ED Notes (Addendum)
Patient transported to US 

## 2015-06-29 NOTE — ED Notes (Addendum)
Patient reports right foot pain since Sunday.  Reports problems with sciatica in the past but states he has never had this type of pain before.  Reports pain with ambulation, swelling to right foot.  Denies injury

## 2015-06-29 NOTE — Discharge Instructions (Signed)
Your foot pain is likely related to cellulitis which is an infection of the skin. Take doxycycline as directed for this, avoid direct sunlight exposure while taking this medication as it can make you sensitive to the sun. Elevate your foot to help with pain and swelling. Use ice or heat to help with swelling and pain. Use tylenol or motrin as needed for pain. Follow up with your regular doctor in 3-5 days for recheck of symptoms. Monitor area for signs of infection to include, but not limited to: increasing pain, spreading redness, drainage/pus, worsening swelling, or fevers. Return to emergency department for emergent changing or worsening symptoms.    Cellulitis Cellulitis is an infection of the skin and the tissue under the skin. The infected area is usually red and tender. This happens most often in the arms and lower legs. HOME CARE   Take your antibiotic medicine as told. Finish the medicine even if you start to feel better.  Keep the infected arm or leg raised (elevated).  Put a warm cloth on the area up to 4 times per day.  Only take medicines as told by your doctor.  Keep all doctor visits as told. GET HELP IF:  You see red streaks on the skin coming from the infected area.  Your red area gets bigger or turns a dark color.  Your bone or joint under the infected area is painful after the skin heals.  Your infection comes back in the same area or different area.  You have a puffy (swollen) bump in the infected area.  You have new symptoms.  You have a fever. GET HELP RIGHT AWAY IF:   You feel very sleepy.  You throw up (vomit) or have watery poop (diarrhea).  You feel sick and have muscle aches and pains.   This information is not intended to replace advice given to you by your health care provider. Make sure you discuss any questions you have with your health care provider.   Document Released: 06/13/2007 Document Revised: 09/15/2014 Document Reviewed:  03/12/2011 Elsevier Interactive Patient Education 2016 [place] therapy can help ease sore, stiff, injured, and tight muscles and joints. Heat relaxes your muscles, which may help ease your pain. Heat therapy should only be used on old, pre-existing, or long-lasting (chronic) injuries. Do not use heat therapy unless told by your doctor. HOW TO USE HEAT THERAPY There are several different kinds of heat therapy, including:  Moist heat pack.  Warm water bath.  Hot water bottle.  Electric heating pad.  Heated gel pack.  Heated wrap.  Electric heating pad. GENERAL HEAT THERAPY RECOMMENDATIONS   Do not sleep while using heat therapy. Only use heat therapy while you are awake.  Your skin may turn pink while using heat therapy. Do not use heat therapy if your skin turns red.  Do not use heat therapy if you have new pain.  High heat or long exposure to heat can cause burns. Be careful when using heat therapy to avoid burning your skin.  Do not use heat therapy on areas of your skin that are already irritated, such as with a rash or sunburn. GET HELP IF:   You have blisters, redness, swelling (puffiness), or numbness.  You have new pain.  Your pain is worse. MAKE SURE YOU:  Understand these instructions.  Will watch your condition.  Will get help right away if you are not doing well or get worse.   This information is  not intended to replace advice given to you by your health care provider. Make sure you discuss any questions you have with your health care provider.   Document Released: 03/19/2011 Document Revised: 01/15/2014 Document Reviewed: 02/17/2013 Elsevier Interactive Patient Education 2016 Elsevier Inc.  Cryotherapy Cryotherapy is when you put ice on your injury. Ice helps lessen pain and puffiness (swelling) after an injury. Ice works the best when you start using it in the first 24 to 48 hours after an injury. HOME CARE  Put a dry or  damp towel between the ice pack and your skin.  You may press gently on the ice pack.  Leave the ice on for no more than 10 to 20 minutes at a time.  Check your skin after 5 minutes to make sure your skin is okay.  Rest at least 20 minutes between ice pack uses.  Stop using ice when your skin loses feeling (numbness).  Do not use ice on someone who cannot tell you when it hurts. This includes small children and people with memory problems (dementia). GET HELP RIGHT AWAY IF:  You have white spots on your skin.  Your skin turns blue or pale.  Your skin feels waxy or hard.  Your puffiness gets worse. MAKE SURE YOU:   Understand these instructions.  Will watch your condition.  Will get help right away if you are not doing well or get worse.   This information is not intended to replace advice given to you by your health care provider. Make sure you discuss any questions you have with your health care provider.   Document Released: 06/13/2007 Document Revised: 03/19/2011 Document Reviewed: 08/17/2010 Elsevier Interactive Patient Education [employer].

## 2015-06-29 NOTE — ED Provider Notes (Signed)
Patient seen/examined in the Emergency Department in conjunction with Midlevel Provider  Patient reports right foot pain/swelling, no trauma Exam : awake/alert, distal pulses intact, mild erythema/tenderness to right foot Plan: DVT study If negative treat for cellulitis    Ripley Fraise, MD 06/29/15 1129

## 2015-07-05 DIAGNOSIS — M4806 Spinal stenosis, lumbar region: Secondary | ICD-10-CM | POA: Diagnosis not present

## 2015-07-05 DIAGNOSIS — M545 Low back pain: Secondary | ICD-10-CM | POA: Diagnosis not present

## 2015-07-05 DIAGNOSIS — M5416 Radiculopathy, lumbar region: Secondary | ICD-10-CM | POA: Diagnosis not present

## 2015-07-06 DIAGNOSIS — M25579 Pain in unspecified ankle and joints of unspecified foot: Secondary | ICD-10-CM | POA: Diagnosis not present

## 2015-07-06 DIAGNOSIS — L0390 Cellulitis, unspecified: Secondary | ICD-10-CM | POA: Diagnosis not present

## 2015-07-19 DIAGNOSIS — L0390 Cellulitis, unspecified: Secondary | ICD-10-CM | POA: Diagnosis not present

## 2015-07-19 DIAGNOSIS — M25579 Pain in unspecified ankle and joints of unspecified foot: Secondary | ICD-10-CM | POA: Diagnosis not present

## 2015-07-20 ENCOUNTER — Other Ambulatory Visit: Payer: Self-pay | Admitting: Orthopedic Surgery

## 2015-07-20 DIAGNOSIS — M5416 Radiculopathy, lumbar region: Secondary | ICD-10-CM

## 2015-07-29 ENCOUNTER — Ambulatory Visit
Admission: RE | Admit: 2015-07-29 | Discharge: 2015-07-29 | Disposition: A | Discharge status: Home / Self Care | Payer: Medicare Other | Source: Ambulatory Visit | Attending: Orthopedic Surgery | Admitting: Orthopedic Surgery

## 2015-07-29 DIAGNOSIS — M5416 Radiculopathy, lumbar region: Secondary | ICD-10-CM

## 2015-07-29 DIAGNOSIS — M4806 Spinal stenosis, lumbar region: Secondary | ICD-10-CM | POA: Diagnosis not present

## 2015-08-01 DIAGNOSIS — M545 Low back pain: Secondary | ICD-10-CM | POA: Diagnosis not present

## 2015-08-01 DIAGNOSIS — M4806 Spinal stenosis, lumbar region: Secondary | ICD-10-CM | POA: Diagnosis not present

## 2015-08-01 DIAGNOSIS — M5416 Radiculopathy, lumbar region: Secondary | ICD-10-CM | POA: Diagnosis not present

## 2015-08-08 DIAGNOSIS — M4806 Spinal stenosis, lumbar region: Secondary | ICD-10-CM | POA: Diagnosis not present

## 2015-08-09 DIAGNOSIS — M545 Low back pain: Secondary | ICD-10-CM | POA: Diagnosis not present

## 2015-08-09 DIAGNOSIS — M5416 Radiculopathy, lumbar region: Secondary | ICD-10-CM | POA: Diagnosis not present

## 2015-08-11 DIAGNOSIS — M545 Low back pain: Secondary | ICD-10-CM | POA: Diagnosis not present

## 2015-08-11 DIAGNOSIS — M5416 Radiculopathy, lumbar region: Secondary | ICD-10-CM | POA: Diagnosis not present

## 2015-08-16 DIAGNOSIS — M545 Low back pain: Secondary | ICD-10-CM | POA: Diagnosis not present

## 2015-08-16 DIAGNOSIS — M5416 Radiculopathy, lumbar region: Secondary | ICD-10-CM | POA: Diagnosis not present

## 2015-09-15 ENCOUNTER — Other Ambulatory Visit: Payer: Self-pay

## 2015-10-17 DIAGNOSIS — M5413 Radiculopathy, cervicothoracic region: Secondary | ICD-10-CM | POA: Diagnosis not present

## 2015-10-17 DIAGNOSIS — M5441 Lumbago with sciatica, right side: Secondary | ICD-10-CM | POA: Diagnosis not present

## 2015-10-17 DIAGNOSIS — M9901 Segmental and somatic dysfunction of cervical region: Secondary | ICD-10-CM | POA: Diagnosis not present

## 2015-10-17 DIAGNOSIS — M9903 Segmental and somatic dysfunction of lumbar region: Secondary | ICD-10-CM | POA: Diagnosis not present

## 2015-10-24 DIAGNOSIS — Z23 Encounter for immunization: Secondary | ICD-10-CM | POA: Diagnosis not present

## 2015-10-31 DIAGNOSIS — M9903 Segmental and somatic dysfunction of lumbar region: Secondary | ICD-10-CM | POA: Diagnosis not present

## 2015-10-31 DIAGNOSIS — M9901 Segmental and somatic dysfunction of cervical region: Secondary | ICD-10-CM | POA: Diagnosis not present

## 2015-10-31 DIAGNOSIS — M5413 Radiculopathy, cervicothoracic region: Secondary | ICD-10-CM | POA: Diagnosis not present

## 2015-10-31 DIAGNOSIS — M5441 Lumbago with sciatica, right side: Secondary | ICD-10-CM | POA: Diagnosis not present

## 2015-11-09 DIAGNOSIS — M7541 Impingement syndrome of right shoulder: Secondary | ICD-10-CM | POA: Diagnosis not present

## 2015-11-14 DIAGNOSIS — M5441 Lumbago with sciatica, right side: Secondary | ICD-10-CM | POA: Diagnosis not present

## 2015-11-14 DIAGNOSIS — M9903 Segmental and somatic dysfunction of lumbar region: Secondary | ICD-10-CM | POA: Diagnosis not present

## 2015-11-14 DIAGNOSIS — M5413 Radiculopathy, cervicothoracic region: Secondary | ICD-10-CM | POA: Diagnosis not present

## 2015-11-14 DIAGNOSIS — M9901 Segmental and somatic dysfunction of cervical region: Secondary | ICD-10-CM | POA: Diagnosis not present

## 2015-12-07 DIAGNOSIS — M7541 Impingement syndrome of right shoulder: Secondary | ICD-10-CM | POA: Diagnosis not present

## 2015-12-07 DIAGNOSIS — Z23 Encounter for immunization: Secondary | ICD-10-CM | POA: Diagnosis not present

## 2016-02-13 DIAGNOSIS — M545 Low back pain: Secondary | ICD-10-CM | POA: Diagnosis not present

## 2016-02-13 DIAGNOSIS — M9903 Segmental and somatic dysfunction of lumbar region: Secondary | ICD-10-CM | POA: Diagnosis not present

## 2016-02-13 DIAGNOSIS — M5413 Radiculopathy, cervicothoracic region: Secondary | ICD-10-CM | POA: Diagnosis not present

## 2016-02-13 DIAGNOSIS — M9901 Segmental and somatic dysfunction of cervical region: Secondary | ICD-10-CM | POA: Diagnosis not present

## 2016-02-27 DIAGNOSIS — M545 Low back pain: Secondary | ICD-10-CM | POA: Diagnosis not present

## 2016-02-27 DIAGNOSIS — M9903 Segmental and somatic dysfunction of lumbar region: Secondary | ICD-10-CM | POA: Diagnosis not present

## 2016-02-27 DIAGNOSIS — M5413 Radiculopathy, cervicothoracic region: Secondary | ICD-10-CM | POA: Diagnosis not present

## 2016-02-27 DIAGNOSIS — M9901 Segmental and somatic dysfunction of cervical region: Secondary | ICD-10-CM | POA: Diagnosis not present

## 2016-03-05 DIAGNOSIS — M5413 Radiculopathy, cervicothoracic region: Secondary | ICD-10-CM | POA: Diagnosis not present

## 2016-03-05 DIAGNOSIS — M9903 Segmental and somatic dysfunction of lumbar region: Secondary | ICD-10-CM | POA: Diagnosis not present

## 2016-03-05 DIAGNOSIS — M9901 Segmental and somatic dysfunction of cervical region: Secondary | ICD-10-CM | POA: Diagnosis not present

## 2016-03-05 DIAGNOSIS — M545 Low back pain: Secondary | ICD-10-CM | POA: Diagnosis not present

## 2016-03-09 ENCOUNTER — Other Ambulatory Visit: Payer: Self-pay | Admitting: Cardiovascular Disease

## 2016-03-09 DIAGNOSIS — I25810 Atherosclerosis of coronary artery bypass graft(s) without angina pectoris: Secondary | ICD-10-CM

## 2016-03-09 DIAGNOSIS — I10 Essential (primary) hypertension: Secondary | ICD-10-CM

## 2016-03-09 DIAGNOSIS — E7800 Pure hypercholesterolemia, unspecified: Secondary | ICD-10-CM

## 2016-03-09 MED ORDER — METOPROLOL TARTRATE 25 MG PO TABS: 25.0000 mg | ORAL_TABLET | Freq: Two times a day (BID) | ORAL | 0 refills | Status: DC

## 2016-03-09 MED ORDER — AMLODIPINE BESYLATE 10 MG PO TABS: 10.0000 mg | ORAL_TABLET | Freq: Every day | ORAL | 0 refills | Status: DC

## 2016-03-09 MED ORDER — EZETIMIBE 10 MG PO TABS: 10.0000 mg | ORAL_TABLET | Freq: Every day | ORAL | 0 refills | Status: DC

## 2016-03-09 MED ORDER — BENAZEPRIL HCL 40 MG PO TABS: 40.0000 mg | ORAL_TABLET | Freq: Every day | ORAL | 0 refills | Status: DC

## 2016-03-12 DIAGNOSIS — M5413 Radiculopathy, cervicothoracic region: Secondary | ICD-10-CM | POA: Diagnosis not present

## 2016-03-12 DIAGNOSIS — M9901 Segmental and somatic dysfunction of cervical region: Secondary | ICD-10-CM | POA: Diagnosis not present

## 2016-03-12 DIAGNOSIS — M9903 Segmental and somatic dysfunction of lumbar region: Secondary | ICD-10-CM | POA: Diagnosis not present

## 2016-03-12 DIAGNOSIS — M545 Low back pain: Secondary | ICD-10-CM | POA: Diagnosis not present

## 2016-03-14 ENCOUNTER — Other Ambulatory Visit: Payer: Self-pay | Admitting: *Deleted

## 2016-03-14 MED ORDER — ATORVASTATIN CALCIUM 10 MG PO TABS: ORAL_TABLET | ORAL | 0 refills | Status: DC

## 2016-03-26 DIAGNOSIS — M9901 Segmental and somatic dysfunction of cervical region: Secondary | ICD-10-CM | POA: Diagnosis not present

## 2016-03-26 DIAGNOSIS — M9903 Segmental and somatic dysfunction of lumbar region: Secondary | ICD-10-CM | POA: Diagnosis not present

## 2016-03-26 DIAGNOSIS — M545 Low back pain: Secondary | ICD-10-CM | POA: Diagnosis not present

## 2016-03-26 DIAGNOSIS — M5413 Radiculopathy, cervicothoracic region: Secondary | ICD-10-CM | POA: Diagnosis not present

## 2016-04-12 ENCOUNTER — Encounter: Payer: Self-pay | Admitting: Cardiovascular Disease

## 2016-04-18 DIAGNOSIS — M9901 Segmental and somatic dysfunction of cervical region: Secondary | ICD-10-CM | POA: Diagnosis not present

## 2016-04-18 DIAGNOSIS — M545 Low back pain: Secondary | ICD-10-CM | POA: Diagnosis not present

## 2016-04-18 DIAGNOSIS — M9903 Segmental and somatic dysfunction of lumbar region: Secondary | ICD-10-CM | POA: Diagnosis not present

## 2016-04-18 DIAGNOSIS — M5413 Radiculopathy, cervicothoracic region: Secondary | ICD-10-CM | POA: Diagnosis not present

## 2016-04-30 ENCOUNTER — Ambulatory Visit (INDEPENDENT_AMBULATORY_CARE_PROVIDER_SITE_OTHER): Payer: Medicare Other | Admitting: Cardiovascular Disease

## 2016-04-30 ENCOUNTER — Encounter: Payer: Self-pay | Admitting: Cardiovascular Disease

## 2016-04-30 DIAGNOSIS — I10 Essential (primary) hypertension: Secondary | ICD-10-CM

## 2016-04-30 DIAGNOSIS — I25810 Atherosclerosis of coronary artery bypass graft(s) without angina pectoris: Secondary | ICD-10-CM

## 2016-04-30 DIAGNOSIS — E7800 Pure hypercholesterolemia, unspecified: Secondary | ICD-10-CM | POA: Diagnosis not present

## 2016-04-30 MED ORDER — METOPROLOL TARTRATE 25 MG PO TABS: 25.0000 mg | ORAL_TABLET | Freq: Two times a day (BID) | ORAL | 3 refills | Status: DC

## 2016-04-30 MED ORDER — EZETIMIBE 10 MG PO TABS: 10.0000 mg | ORAL_TABLET | Freq: Every day | ORAL | 0 refills | Status: DC

## 2016-04-30 MED ORDER — EZETIMIBE 10 MG PO TABS: 10.0000 mg | ORAL_TABLET | Freq: Every day | ORAL | 3 refills | Status: DC

## 2016-04-30 MED ORDER — BENAZEPRIL HCL 40 MG PO TABS: 40.0000 mg | ORAL_TABLET | Freq: Every day | ORAL | 3 refills | Status: DC

## 2016-04-30 MED ORDER — ATORVASTATIN CALCIUM 10 MG PO TABS: ORAL_TABLET | ORAL | 3 refills | Status: DC

## 2016-04-30 MED ORDER — AMLODIPINE BESYLATE 10 MG PO TABS: 10.0000 mg | ORAL_TABLET | Freq: Every day | ORAL | 3 refills | Status: DC

## 2016-04-30 NOTE — Progress Notes (Signed)
Cardiology Office Note Date:  04/30/2016   ID:  Pau, Banh 1930-08-11, MRN [id number]  PCP:  Pcp Not In System  Cardiologist:  Sherren Mocha, MD    Chief Complaint  Patient presents with  . routine office visit   History of Present Illness: ELAZAR ARGABRIGHT is a 83 y.o. male who presents for  follow-up evaluation. He underwent remote multivessel CABG approximately 15 years ago. His most recent stress test from one year ago showed no major areas of ischemia. LVEF was gated at 63%.   The patient is here alone today. His wife passed away of chronic illness several months ago. His daughter has had breast cancer but apparently was caught fairly early and she had surgery. He reports she is recovering well.  The patient denies symptoms of chest pain. He is fairly limited by arthritis. He has some shortness of breath but this only occurs when he is experiencing pain in his legs. He denies orthopnea, PND, or heart palpitations. He's tolerating his medications well.   Past Medical History:  Diagnosis Date  . Atrial fibrillation ([place])   . CAD (coronary artery disease)     5 with the left internal mamary to the left anterior  descending coronary artery.  [name] Kitchen DVT femoral (deep venous thrombosis) with thrombophlebitis ([place])   . Dyslipidemia   . HTN (hypertension)     Past Surgical History:  Procedure Laterality Date  . CORONARY ARTERY BYPASS GRAFT  2001    Current Outpatient Prescriptions  Medication Sig Dispense Refill  . amLODipine (NORVASC) 10 MG tablet Take 1 tablet (10 mg total) by mouth daily. 90 tablet 3  . aspirin 81 MG tablet Take 81 mg by mouth daily.      [name] Kitchen atorvastatin (LIPITOR) 10 MG tablet Take one 10 mg tablet by mouth on Monday,Wednesday and Friday. 45 tablet 3  . benazepril (LOTENSIN) 40 MG tablet Take 1 tablet (40 mg total) by mouth daily. 90 tablet 3  . Coenzyme Q10 200 MG TABS Take 1 tablet by mouth daily.    [name] Kitchen ezetimibe (ZETIA) 10 MG tablet Take 1 tablet  (10 mg total) by mouth daily. 14 tablet 0  . glucosamine-chondroitin 500-400 MG tablet Take 1 tablet by mouth 3 (three) times daily.    . metoprolol tartrate (LOPRESSOR) 25 MG tablet Take 1 tablet (25 mg total) by mouth 2 (two) times daily. 180 tablet 3  . Multiple Vitamins-Minerals (CENTRUM SILVER PO) 1 tab po qd     . OMEGA-3 FATTY ACIDS PO Take 1,200 mg by mouth 2 (two) times daily.     No current facility-administered medications for this visit.     Allergies:   Iodine and Statins   Social History:  The patient  reports that he has quit smoking. He has never used smokeless tobacco. He reports that he does not drink alcohol or use drugs.   Family History:  The patient's family history includes Cancer in his mother; Heart attack in his father; Heart disease in his father and mother.    ROS:  Please see the history of present illness.  Otherwise, review of systems is positive for cough, shortness of breath with activity, back pain, muscle pain, easy bruising.  All other systems are reviewed and negative.    PHYSICAL EXAM: VS:  BP 140/66   Pulse (!) 54   Ht '5\' 9"'$  (1.753 m)   Wt 204 lb (92.5 kg)   BMI 30.13 kg/m  , BMI Body mass  index is 30.13 kg/m. GEN: Well nourished, well developed, pleasant elderly male in no acute distress  HEENT: normal  Neck: no JVD, no masses. No carotid bruits Cardiac: RRR without murmur or gallop                Respiratory:  clear to auscultation bilaterally, normal work of breathing GI: soft, nontender, nondistended, + BS MS: no deformity or atrophy  Ext: no pretibial edema, pedal pulses 2+= bilaterally Skin: warm and dry, no rash Neuro:  Strength and sensation are intact Psych: euthymic mood, full affect  EKG:  EKG is ordered today. The ekg ordered today shows sinus bradycardia with sinus arrhythmia, nonspecific ST abnormality  Recent Labs: No results found for requested labs within last 8760 hours.   Lipid Panel     Component Value Date/Time    CHOL 148 02/28/2015 1541   TRIG 114 02/28/2015 1541   TRIG 89 11/15/2005 0820   HDL 35 (L) 02/28/2015 1541   CHOLHDL 4.2 02/28/2015 1541   VLDL 23 02/28/2015 1541   LDLCALC 90 02/28/2015 1541   LDLDIRECT 139.9 06/18/2006 1008      Wt Readings from Last 3 Encounters:  04/30/16 204 lb (92.5 kg)  06/29/15 200 lb (90.7 kg)  02/28/15 204 lb 12.8 oz (92.9 kg)     Cardiac Studies Reviewed: Nuclear stress test 02-17-2014: QPS Raw Data Images:  Normal; no motion artifact; normal heart/lung ratio. Stress Images:  Decreased uptake in the basal and mid anterolateral and apical lateral walls.  Rest Images:  Decreased uptake in the basal and mid anterolateral and apical lateral walls.  Subtraction (SDS):  No evidence of ischemia. Transient Ischemic Dilatation (Normal <1.22):  0.89 Lung/Heart Ratio (Normal <0.45):  0.27  Quantitative Gated Spect Images QGS EDV:  94 ml QGS ESV:  35 ml  Impression Exercise Capacity:  Good exercise capacity. BP Response:  Hypertensive blood pressure response. Clinical Symptoms:  No significant symptoms noted. ECG Impression:  No significant ST segment change suggestive of ischemia. Comparison with Prior Nuclear Study: No significant change from previous study  Overall Impression:  Low risk stress nuclear study with a medium size scar in the [place] territory with no ischemia. .  LV Ejection Fraction: 63%.  LV Wall Motion:  Hypokinesis is the basal and mid anterolateral wall.     ASSESSMENT AND PLAN: 1.  CAD, native vessel, without angina: Patient's medical program is reviewed and will be continued without changes. He is not limited by any cardiac symptoms. He has no chest discomfort. I will see him back in one year. His stress test from 2016 is reviewed.  2. Hypertension: Continue amlodipine, metoprolol, and then as appropriate. We will update lab work with metabolic panel.  3. Hyperlipidemia: Last lipids are reviewed as above. He continues  on atorvastatin 10 mg on Mondays, Wednesdays, and Fridays as tolerated. Also taking zetia. Will check lipids.  Overall he appears to be stable from a cardiac perspective. He does not see a primary care physician regularly. We'll check a CBC, full metabolic panel, and lipid panel.  Current medicines are reviewed with the patient today.  The patient does not have concerns regarding medicines.  Labs/ tests ordered today include:   Orders Placed This Encounter  Procedures  . Lipid panel  . Comp Met (CMET)  . CBC  . EKG 12-Lead    Disposition:   FU one year  Signed, Sherren Mocha, MD  04/30/2016 5:23 PM    [facility]  [address] [place], [place]  [address] Phone: [phone]; Fax: [phone]

## 2016-04-30 NOTE — Patient Instructions (Signed)
Medication Instructions:  Your physician recommends that you continue on your current medications as directed. Please refer to the Current Medication list given to you today.  Labwork: Your physician recommends that you return for a FASTING LIPID, CBC and CMP--nothing to eat or drink after midnight, lab opens at 7:30 AM  Testing/Procedures: No new orders.   Follow-Up: Your physician wants you to follow-up in: 1 YEAR with Dr Burt Knack.  You will receive a reminder letter in the mail two months in advance. If you don't receive a letter, please call our office to schedule the follow-up appointment.   Any Other Special Instructions Will Be Listed Below (If Applicable).     If you need a refill on your cardiac medications before your next appointment, please call your pharmacy.

## 2016-05-04 ENCOUNTER — Other Ambulatory Visit: Payer: Medicare Other | Admitting: *Deleted

## 2016-05-04 DIAGNOSIS — I10 Essential (primary) hypertension: Secondary | ICD-10-CM

## 2016-05-04 DIAGNOSIS — E7800 Pure hypercholesterolemia, unspecified: Secondary | ICD-10-CM | POA: Diagnosis not present

## 2016-05-04 DIAGNOSIS — I25810 Atherosclerosis of coronary artery bypass graft(s) without angina pectoris: Secondary | ICD-10-CM

## 2016-05-04 LAB — COMPREHENSIVE METABOLIC PANEL
ALT: 14 IU/L (ref 0–44)
AST: 19 IU/L (ref 0–40)
Albumin/Globulin Ratio: 1.9 (ref 1.2–2.2)
Albumin: 4.2 g/dL (ref 3.5–4.7)
Alkaline Phosphatase: 42 IU/L (ref 39–117)
BUN/Creatinine Ratio: 24 (ref 10–24)
BUN: 24 mg/dL (ref 8–27)
Bilirubin Total: 0.4 mg/dL (ref 0.0–1.2)
CO2: 25 mmol/L (ref 18–29)
Calcium: 9.1 mg/dL (ref 8.6–10.2)
Chloride: 106 mmol/L (ref 96–106)
Creatinine, Ser: 1.02 mg/dL (ref 0.76–1.27)
GFR calc Af Amer: 78 mL/min/{1.73_m2} (ref >59)
GFR calc non Af Amer: 68 mL/min/{1.73_m2} (ref >59)
Globulin, Total: 2.2 g/dL (ref 1.5–4.5)
Glucose: 92 mg/dL (ref 65–99)
Potassium: 5.3 mmol/L — ABNORMAL HIGH (ref 3.5–5.2)
Sodium: 143 mmol/L (ref 134–144)
Total Protein: 6.4 g/dL (ref 6.0–8.5)

## 2016-05-04 LAB — LIPID PANEL
Chol/HDL Ratio: 3.8 ratio (ref 0.0–5.0)
Cholesterol, Total: 140 mg/dL (ref 100–199)
HDL: 37 mg/dL — ABNORMAL LOW (ref >39)
LDL Calculated: 82 mg/dL (ref 0–99)
Triglycerides: 103 mg/dL (ref 0–149)
VLDL Cholesterol Cal: 21 mg/dL (ref 5–40)

## 2016-05-04 LAB — CBC
Hematocrit: 39.4 % (ref 37.5–51.0)
Hemoglobin: 13.4 g/dL (ref 13.0–17.7)
MCH: 32.7 pg (ref 26.6–33.0)
MCHC: 34 g/dL (ref 31.5–35.7)
MCV: 96 fL (ref 79–97)
Platelets: 175 10*3/uL (ref 150–379)
RBC: 4.1 x10E6/uL — ABNORMAL LOW (ref 4.14–5.80)
RDW: 13.5 % (ref 12.3–15.4)
WBC: 5.1 10*3/uL (ref 3.4–10.8)

## 2016-05-07 ENCOUNTER — Telehealth: Payer: Self-pay | Admitting: Cardiovascular Disease

## 2016-05-07 NOTE — Telephone Encounter (Signed)
New message       Calling to get lab results.  Pt cannot get into mychart.

## 2016-05-07 NOTE — Telephone Encounter (Signed)
Pt aware of results and will decrease K+ rich foods for now.  He is aware of which foods are high in K+ and will reviewed a list as well.  He is having trouble remembering his password to get into MyChart and will have to reset it.

## 2016-05-09 DIAGNOSIS — M9903 Segmental and somatic dysfunction of lumbar region: Secondary | ICD-10-CM | POA: Diagnosis not present

## 2016-05-09 DIAGNOSIS — M545 Low back pain: Secondary | ICD-10-CM | POA: Diagnosis not present

## 2016-05-09 DIAGNOSIS — M9901 Segmental and somatic dysfunction of cervical region: Secondary | ICD-10-CM | POA: Diagnosis not present

## 2016-05-09 DIAGNOSIS — M5413 Radiculopathy, cervicothoracic region: Secondary | ICD-10-CM | POA: Diagnosis not present

## 2016-05-21 DIAGNOSIS — M9901 Segmental and somatic dysfunction of cervical region: Secondary | ICD-10-CM | POA: Diagnosis not present

## 2016-05-21 DIAGNOSIS — M9903 Segmental and somatic dysfunction of lumbar region: Secondary | ICD-10-CM | POA: Diagnosis not present

## 2016-05-21 DIAGNOSIS — M545 Low back pain: Secondary | ICD-10-CM | POA: Diagnosis not present

## 2016-05-21 DIAGNOSIS — M5413 Radiculopathy, cervicothoracic region: Secondary | ICD-10-CM | POA: Diagnosis not present

## 2016-06-18 DIAGNOSIS — M9901 Segmental and somatic dysfunction of cervical region: Secondary | ICD-10-CM | POA: Diagnosis not present

## 2016-06-18 DIAGNOSIS — M545 Low back pain: Secondary | ICD-10-CM | POA: Diagnosis not present

## 2016-06-18 DIAGNOSIS — M5413 Radiculopathy, cervicothoracic region: Secondary | ICD-10-CM | POA: Diagnosis not present

## 2016-06-18 DIAGNOSIS — M9903 Segmental and somatic dysfunction of lumbar region: Secondary | ICD-10-CM | POA: Diagnosis not present

## 2016-07-02 DIAGNOSIS — M9903 Segmental and somatic dysfunction of lumbar region: Secondary | ICD-10-CM | POA: Diagnosis not present

## 2016-07-02 DIAGNOSIS — M5413 Radiculopathy, cervicothoracic region: Secondary | ICD-10-CM | POA: Diagnosis not present

## 2016-07-02 DIAGNOSIS — M9901 Segmental and somatic dysfunction of cervical region: Secondary | ICD-10-CM | POA: Diagnosis not present

## 2016-07-02 DIAGNOSIS — M545 Low back pain: Secondary | ICD-10-CM | POA: Diagnosis not present

## 2016-07-18 ENCOUNTER — Observation Stay (HOSPITAL_COMMUNITY)
Admission: EM | Admit: 2016-07-18 | Discharge: 2016-07-20 | Disposition: A | Discharge status: Home / Self Care | Payer: Medicare Other | Attending: Cardiovascular Disease | Admitting: Cardiovascular Disease

## 2016-07-18 ENCOUNTER — Telehealth: Payer: Self-pay | Admitting: Cardiovascular Disease

## 2016-07-18 ENCOUNTER — Encounter (HOSPITAL_COMMUNITY): Payer: Self-pay | Admitting: Emergency Medicine

## 2016-07-18 ENCOUNTER — Emergency Department (HOSPITAL_COMMUNITY): Payer: Medicare Other

## 2016-07-18 DIAGNOSIS — Z7982 Long term (current) use of aspirin: Secondary | ICD-10-CM | POA: Insufficient documentation

## 2016-07-18 DIAGNOSIS — Z86718 Personal history of other venous thrombosis and embolism: Secondary | ICD-10-CM | POA: Diagnosis not present

## 2016-07-18 DIAGNOSIS — Z87891 Personal history of nicotine dependence: Secondary | ICD-10-CM | POA: Diagnosis not present

## 2016-07-18 DIAGNOSIS — I10 Essential (primary) hypertension: Secondary | ICD-10-CM | POA: Diagnosis not present

## 2016-07-18 DIAGNOSIS — R9431 Abnormal electrocardiogram [ECG] [EKG]: Secondary | ICD-10-CM | POA: Diagnosis not present

## 2016-07-18 DIAGNOSIS — Z883 Allergy status to other anti-infective agents status: Secondary | ICD-10-CM | POA: Insufficient documentation

## 2016-07-18 DIAGNOSIS — R072 Precordial pain: Secondary | ICD-10-CM | POA: Diagnosis not present

## 2016-07-18 DIAGNOSIS — Z8249 Family history of ischemic heart disease and other diseases of the circulatory system: Secondary | ICD-10-CM | POA: Insufficient documentation

## 2016-07-18 DIAGNOSIS — R079 Chest pain, unspecified: Secondary | ICD-10-CM | POA: Insufficient documentation

## 2016-07-18 DIAGNOSIS — I2542 Coronary artery dissection: Secondary | ICD-10-CM | POA: Insufficient documentation

## 2016-07-18 DIAGNOSIS — I25110 Atherosclerotic heart disease of native coronary artery with unstable angina pectoris: Principal | ICD-10-CM | POA: Diagnosis present

## 2016-07-18 DIAGNOSIS — I200 Unstable angina: Secondary | ICD-10-CM | POA: Diagnosis present

## 2016-07-18 DIAGNOSIS — E785 Hyperlipidemia, unspecified: Secondary | ICD-10-CM | POA: Diagnosis not present

## 2016-07-18 DIAGNOSIS — I2582 Chronic total occlusion of coronary artery: Secondary | ICD-10-CM | POA: Insufficient documentation

## 2016-07-18 DIAGNOSIS — Z951 Presence of aortocoronary bypass graft: Secondary | ICD-10-CM | POA: Diagnosis not present

## 2016-07-18 HISTORY — DX: Chest pain, unspecified: R07.9

## 2016-07-18 LAB — BASIC METABOLIC PANEL
Anion gap: 8 (ref 5–15)
BUN: 22 mg/dL — ABNORMAL HIGH (ref 6–20)
CO2: 22 mmol/L (ref 22–32)
Calcium: 9.3 mg/dL (ref 8.9–10.3)
Chloride: 109 mmol/L (ref 101–111)
Creatinine, Ser: 1.18 mg/dL (ref 0.61–1.24)
GFR calc Af Amer: >60 mL/min (ref >60)
GFR calc non Af Amer: 55 mL/min — ABNORMAL LOW (ref >60)
Glucose, Bld: 98 mg/dL (ref 65–99)
Potassium: 4.5 mmol/L (ref 3.5–5.1)
Sodium: 139 mmol/L (ref 135–145)

## 2016-07-18 LAB — CBC
HCT: 40 % (ref 39.0–52.0)
Hemoglobin: 13.5 g/dL (ref 13.0–17.0)
MCH: 32.2 pg (ref 26.0–34.0)
MCHC: 33.8 g/dL (ref 30.0–36.0)
MCV: 95.5 fL (ref 78.0–100.0)
Platelets: 158 10*3/uL (ref 150–400)
RBC: 4.19 MIL/uL — ABNORMAL LOW (ref 4.22–5.81)
RDW: 12.9 % (ref 11.5–15.5)
WBC: 6.4 10*3/uL (ref 4.0–10.5)

## 2016-07-18 LAB — I-STAT TROPONIN, ED: Troponin i, poc: 0 ng/mL (ref 0.00–0.08)

## 2016-07-18 MED ORDER — ASPIRIN 81 MG PO CHEW
324.0000 mg | CHEWABLE_TABLET | ORAL | Status: AC
  Filled 2016-07-18: qty 4

## 2016-07-18 MED ORDER — ASPIRIN EC 81 MG PO TBEC: 81.0000 mg | DELAYED_RELEASE_TABLET | Freq: Every day | ORAL | Status: DC

## 2016-07-18 MED ORDER — BENAZEPRIL HCL 10 MG PO TABS
40.0000 mg | ORAL_TABLET | Freq: Every day | ORAL | Status: DC
  Administered 2016-07-19 – 2016-07-20 (×2): 40 mg via ORAL
  Filled 2016-07-18 (×2): qty 4

## 2016-07-18 MED ORDER — OMEGA-3-ACID ETHYL ESTERS 1 G PO CAPS
1.0000 g | ORAL_CAPSULE | Freq: Two times a day (BID) | ORAL | Status: DC
  Administered 2016-07-18 – 2016-07-20 (×3): 1 g via ORAL
  Filled 2016-07-18 (×4): qty 1

## 2016-07-18 MED ORDER — ONDANSETRON HCL 4 MG/2ML IJ SOLN: 4.0000 mg | Freq: Four times a day (QID) | INTRAMUSCULAR | Status: DC | PRN

## 2016-07-18 MED ORDER — METOPROLOL TARTRATE 25 MG PO TABS
25.0000 mg | ORAL_TABLET | Freq: Two times a day (BID) | ORAL | Status: DC
  Administered 2016-07-18 – 2016-07-20 (×4): 25 mg via ORAL
  Filled 2016-07-18 (×4): qty 1

## 2016-07-18 MED ORDER — SODIUM CHLORIDE 0.9 % WEIGHT BASED INFUSION
1.0000 mL/kg/h | INTRAVENOUS | Status: DC
  Administered 2016-07-19: 1 mL/kg/h via INTRAVENOUS

## 2016-07-18 MED ORDER — COENZYME Q10 200 MG PO TABS: 1.0000 | ORAL_TABLET | Freq: Every day | ORAL | Status: DC

## 2016-07-18 MED ORDER — SODIUM CHLORIDE 0.9 % WEIGHT BASED INFUSION
3.0000 mL/kg/h | INTRAVENOUS | Status: DC
  Administered 2016-07-19 (×2): 3 mL/kg/h via INTRAVENOUS

## 2016-07-18 MED ORDER — ATORVASTATIN CALCIUM 10 MG PO TABS: 10.0000 mg | ORAL_TABLET | ORAL | Status: DC

## 2016-07-18 MED ORDER — EZETIMIBE 10 MG PO TABS
10.0000 mg | ORAL_TABLET | Freq: Every day | ORAL | Status: DC
Start: 2016-07-19 — End: 2016-07-20
  Administered 2016-07-19 – 2016-07-20 (×2): 10 mg via ORAL
  Filled 2016-07-18 (×2): qty 1

## 2016-07-18 MED ORDER — ASPIRIN 81 MG PO CHEW
324.0000 mg | CHEWABLE_TABLET | Freq: Once | ORAL | Status: AC
  Administered 2016-07-18: 324 mg via ORAL
  Filled 2016-07-18: qty 4

## 2016-07-18 MED ORDER — ACETAMINOPHEN 325 MG PO TABS: 650.0000 mg | ORAL_TABLET | ORAL | Status: DC | PRN

## 2016-07-18 MED ORDER — HEPARIN BOLUS VIA INFUSION
4000.0000 [IU] | Freq: Once | INTRAVENOUS | Status: AC
  Administered 2016-07-18: 4000 [IU] via INTRAVENOUS
  Filled 2016-07-18: qty 4000

## 2016-07-18 MED ORDER — HEPARIN (PORCINE) IN NACL 100-0.45 UNIT/ML-% IJ SOLN
1050.0000 [IU]/h | INTRAMUSCULAR | Status: DC
  Administered 2016-07-18: 1250 [IU]/h via INTRAVENOUS
  Filled 2016-07-18: qty 250

## 2016-07-18 MED ORDER — NITROGLYCERIN 0.4 MG SL SUBL: 0.4000 mg | SUBLINGUAL_TABLET | SUBLINGUAL | Status: DC | PRN

## 2016-07-18 MED ORDER — SODIUM CHLORIDE 0.9% FLUSH
3.0000 mL | Freq: Two times a day (BID) | INTRAVENOUS | Status: DC
  Administered 2016-07-18: 3 mL via INTRAVENOUS

## 2016-07-18 MED ORDER — ASPIRIN 81 MG PO CHEW
81.0000 mg | CHEWABLE_TABLET | ORAL | Status: AC
  Administered 2016-07-19: 81 mg via ORAL
  Filled 2016-07-18: qty 1

## 2016-07-18 MED ORDER — SODIUM CHLORIDE 0.9% FLUSH: 3.0000 mL | INTRAVENOUS | Status: DC | PRN

## 2016-07-18 MED ORDER — AMLODIPINE BESYLATE 10 MG PO TABS
10.0000 mg | ORAL_TABLET | Freq: Every day | ORAL | Status: DC
  Administered 2016-07-19 – 2016-07-20 (×2): 10 mg via ORAL
  Filled 2016-07-18 (×2): qty 1

## 2016-07-18 MED ORDER — ASPIRIN 300 MG RE SUPP: 300.0000 mg | RECTAL | Status: AC

## 2016-07-18 MED ORDER — GLUCOSAMINE-CHONDROITIN 500-400 MG PO TABS: 1.0000 | ORAL_TABLET | Freq: Three times a day (TID) | ORAL | Status: DC

## 2016-07-18 MED ORDER — SODIUM CHLORIDE 0.9 % IV SOLN: 250.0000 mL | INTRAVENOUS | Status: DC | PRN

## 2016-07-18 MED ORDER — ASPIRIN EC 81 MG PO TBEC
81.0000 mg | DELAYED_RELEASE_TABLET | Freq: Every day | ORAL | Status: DC
  Administered 2016-07-19 – 2016-07-20 (×2): 81 mg via ORAL
  Filled 2016-07-18 (×2): qty 1

## 2016-07-18 NOTE — ED Triage Notes (Signed)
States has had cp x 2 weeks yesterday it came and did n ot go aWAY  Some sob no n/v has had tingling inleft arm

## 2016-07-18 NOTE — Progress Notes (Signed)
ANTICOAGULATION CONSULT NOTE - Initial Consult  Pharmacy Consult for Heparin  Indication: chest pain/ACS  Allergies  Allergen Reactions  . Iodine Other (See Comments)    Patient doesn't remember  . Statins Other (See Comments)    REACTION: Reaction not known    Patient Measurements:   Heparin Dosing Weight: 90kg (previous wt)    Vital Signs: Temp: 98.1 F (36.7 C) (07/11 1730) Temp Source: Oral (07/11 1730) BP: 150/66 (07/11 2130) Pulse Rate: 58 (07/11 2130)  Labs:  Recent Labs  07/18/16 1432  HGB 13.5  HCT 40.0  PLT 158  CREATININE 1.18    CrCl cannot be calculated (Unknown ideal weight.).   Medical History: Past Medical History:  Diagnosis Date  . Atrial fibrillation ([place])   . CAD (coronary artery disease)     5 with the left internal mamary to the left anterior  descending coronary artery.  [name] Kitchen DVT femoral (deep venous thrombosis) with thrombophlebitis ([place])   . Dyslipidemia   . HTN (hypertension)      Assessment: 59yom Hx CAD and CABG 2011 admitted with chest pain.  Plan to start heparin and evaluate chest pain.  CBC stable, no s/s bleeding noted.    Goal of Therapy:  Heparin level 0.3-0.7 units/ml Monitor platelets by anticoagulation protocol: Yes   Plan:  Heparin bolus 4000 uts iv x1 Heparin drip 1250 units/hr  HL 6hr after start and daily HL, CBC  Bonnita Nasuti Pharm.D. CPP, BCPS Clinical Pharmacist [phone] 07/18/2016 10:23 PM

## 2016-07-18 NOTE — ED Provider Notes (Signed)
[facility] DEPT Provider Note   CSN: [id number] Arrival date & time: 07/18/16  1356     History   Chief Complaint Chief Complaint  Patient presents with  . Chest Pain    HPI Luke [name] is a 83 y.o. male.  Luke [name] is a 83 y.o. Male with history of coronary artery disease status post CABG in 2001, and paroxysmal atrial fibrillation who presents to the emergency department complaining of intermittent chest pain ongoing for about 2 weeks now. He reports it is been gradually worsening in recently he's been having some chest pain at rest. He reports left-sided chest pain that he describes as a tightness. He reports it seems to be worse when he is outside working in his yard. He reports last having some chest pain this morning. No treatments attempted prior to arrival. He sees cardiologist Dr. Burt Knack. He takes a daily baby aspirin nightly. He denies fevers, coughing, shortness of breath, leg pain, leg swelling, weakness, lightheadedness, dizziness, syncope or rashes.   The history is provided by the patient, medical records and a relative. No language interpreter was used.  Chest Pain   Pertinent negatives include no abdominal pain, no back pain, no cough, no dizziness, no fever, no headaches, no nausea, no palpitations, no shortness of breath, no vomiting and no weakness.    Past Medical History:  Diagnosis Date  . Atrial fibrillation ([place])   . CAD (coronary artery disease)     5 with the left internal mamary to the left anterior  descending coronary artery.  [name] Kitchen DVT femoral (deep venous thrombosis) with thrombophlebitis ([place])   . Dyslipidemia   . HTN (hypertension)     Patient Active Problem List   Diagnosis Date Noted  . Chest pain 07/18/2016  . DYSLIPIDEMIA 04/16/2008  . HYPERTENSION 04/16/2008  . CAD, ARTERY BYPASS GRAFT 04/16/2008  . DEEP VENOUS THROMBOPHLEBITIS, HX OF 04/16/2008  . ATRIAL FIBRILLATION, HX OF 04/16/2008    Past Surgical History:  Procedure  Laterality Date  . CORONARY ARTERY BYPASS GRAFT  2001       Home Medications    Prior to Admission medications   Medication Sig Start Date End Date Taking? Authorizing Provider  amLODipine (NORVASC) 10 MG tablet Take 1 tablet (10 mg total) by mouth daily. 04/30/16   Sherren Mocha, MD  aspirin 81 MG tablet Take 81 mg by mouth daily.      [provider]  atorvastatin (LIPITOR) 10 MG tablet Take one 10 mg tablet by mouth on Monday,Wednesday and Friday. 04/30/16   Sherren Mocha, MD  benazepril (LOTENSIN) 40 MG tablet Take 1 tablet (40 mg total) by mouth daily. 04/30/16   Sherren Mocha, MD  Coenzyme Q10 200 MG TABS Take 1 tablet by mouth daily.    [provider]  ezetimibe (ZETIA) 10 MG tablet Take 1 tablet (10 mg total) by mouth daily. 04/30/16   Sherren Mocha, MD  glucosamine-chondroitin 500-400 MG tablet Take 1 tablet by mouth 3 (three) times daily.    [provider]  metoprolol tartrate (LOPRESSOR) 25 MG tablet Take 1 tablet (25 mg total) by mouth 2 (two) times daily. 04/30/16   Sherren Mocha, MD  Multiple Vitamins-Minerals (CENTRUM SILVER PO) 1 tab po qd     [provider]  OMEGA-3 FATTY ACIDS PO Take 1,200 mg by mouth 2 (two) times daily.    [provider]    Family History Family History  Problem Relation Age of Onset  . Heart  disease Mother   . Cancer Mother   . Heart disease Father   . Heart attack Father   . Coronary artery disease Unknown     Social History Social History  Substance Use Topics  . Smoking status: Former Research scientist (life sciences)  . Smokeless tobacco: Never Used     Comment: stopped in the 1950's  . Alcohol use No     Allergies   Iodine and Statins   Review of Systems Review of Systems  Constitutional: Negative for chills and fever.  HENT: Negative for congestion and sore throat.   Eyes: Negative for visual disturbance.  Respiratory: Negative for cough, shortness of breath and wheezing.   Cardiovascular:  Positive for chest pain. Negative for palpitations and leg swelling.  Gastrointestinal: Negative for abdominal pain, nausea and vomiting.  Genitourinary: Negative for dysuria.  Musculoskeletal: Negative for back pain and neck pain.  Skin: Negative for rash.  Neurological: Negative for dizziness, syncope, weakness, light-headedness and headaches.     Physical Exam Updated Vital Signs BP 131/65   Pulse (!) 59   Temp 98.1 F (36.7 C) (Oral)   Resp 20   SpO2 94%   Physical Exam  Constitutional: He is oriented to person, place, and time. He appears well-developed and well-nourished. No distress.  Nontoxic appearing.  HENT:  Head: Normocephalic and atraumatic.  Mouth/Throat: Oropharynx is clear and moist.  Eyes: Conjunctivae are normal. Pupils are equal, round, and reactive to light. Right eye exhibits no discharge. Left eye exhibits no discharge.  Neck: Neck supple. No JVD present.  Cardiovascular: Normal rate, regular rhythm, normal heart sounds and intact distal pulses.  Exam reveals no gallop and no friction rub.   No murmur heard. Bilateral radial, posterior tibialis and dorsalis pedis pulses are intact.    Pulmonary/Chest: Effort normal and breath sounds normal. No stridor. No respiratory distress. He has no wheezes. He has no rales.  Lungs are clear to ascultation bilaterally. Symmetric chest expansion bilaterally. No increased work of breathing. No rales or rhonchi.    Abdominal: Soft. There is no tenderness.  Musculoskeletal: Normal range of motion. He exhibits edema. He exhibits no tenderness or deformity.  Mild bilateral ankle edema without calf edema or tenderness.  Lymphadenopathy:    He has no cervical adenopathy.  Neurological: He is alert and oriented to person, place, and time. No sensory deficit. Coordination normal.  Skin: Skin is warm and dry. Capillary refill takes less than 2 seconds. No rash noted. He is not diaphoretic. No erythema. No pallor.  Psychiatric: He  has a normal mood and affect. His behavior is normal.  Nursing note and vitals reviewed.    ED Treatments / Results  Labs (all labs ordered are listed, but only abnormal results are displayed) Labs Reviewed  BASIC METABOLIC PANEL - Abnormal; Notable for the following:       Result Value   BUN 22 (*)    GFR calc non Af Amer 55 (*)    All other components within normal limits  CBC - Abnormal; Notable for the following:    RBC 4.19 (*)    All other components within normal limits  I-STAT TROPOININ, ED    EKG  EKG Interpretation  Date/Time:  Wednesday July 18 2016 14:07:29 EDT Ventricular Rate:  73 PR Interval:  194 QRS Duration: 90 QT Interval:  404 QTC Calculation: 445 R Axis:   37 Text Interpretation:  Sinus rhythm with Premature atrial complexes Nonspecific ST abnormality Abnormal ECG ST depressions in V2-V4  Confirmed by Zenovia Jarred [phone]) on 07/18/2016 2:10:37 PM       Radiology Dg Chest 2 View  Result Date: 07/18/2016 CLINICAL DATA:  LEFT lower chest pain and tightness into back for 2 weeks, worse for 1 day with tingling in LEFT arm, shortness of breath, hypertension, former smoker, coronary artery disease, atrial fibrillation EXAM: CHEST  2 VIEW COMPARISON:  04/25/2004 FINDINGS: Normal heart size post CABG. Mediastinal contours and pulmonary vascularity normal. Lungs clear. No pleural effusion or pneumothorax. Scattered endplate spur formation and diffuse idiopathic skeletal hyperostosis of the thoracic spine. IMPRESSION: No acute abnormalities. Electronically Signed   By: Lavonia Dana M.D.   On: 07/18/2016 14:47    Procedures Procedures (including critical care time)  Medications Ordered in ED Medications  aspirin chewable tablet 324 mg (324 mg Oral Given 07/18/16 1758)     Initial Impression / Assessment and Plan / ED Course  I have reviewed the triage vital signs and the nursing notes.  Pertinent labs & imaging results that were available during my care  of the patient were reviewed by me and considered in my medical decision making (see chart for details).    This  is a 83 y.o. Male with history of coronary artery disease status post CABG in 2001, and paroxysmal atrial fibrillation who presents to the emergency department complaining of intermittent chest pain ongoing for about 2 weeks now. He reports it is been gradually worsening in recently he's been having some chest pain at rest. He reports left-sided chest pain that he describes as a tightness. He reports it seems to be worse when he is outside working in his yard. He reports last having some chest pain this morning. No treatments attempted prior to arrival. He sees cardiologist Dr. Burt Knack.  On exam the patient is afebrile nontoxic appearing. She denies any current chest pain. He reports he most recently had chest pain on the drive to the hospital. Lungs clear auscultation bilaterally. No JVD. Pulses are intact. EKG shows some ST depressions in V2, V3 and V4. Chest x-ray is unremarkable. Initial troponin is not elevated. We'll provide patient with aspirin and consult to cardiology. Suspect patient will need admission for observation due to patient's significant cardiac history. I consulted with cardiologist Dr. Ellyn Hack. He agrees with plan for admission. He will be down to see the patient.  Patient and family agree with plan for admission.   This patient was discussed with and evaluated Dr. Lita Mains who agrees with assessment and plan.    Final Clinical Impressions(s) / ED Diagnoses   Final diagnoses:  Precordial pain  Abnormal EKG  Hx of CABG    New Prescriptions New Prescriptions   No medications on file     Sharmaine Base 07/18/16 2040    Julianne Rice, MD 07/19/16 1521

## 2016-07-18 NOTE — Telephone Encounter (Signed)
Pt c/o of Chest Pain: 1. Are you having CP right now?Chest Pressure  2. Are you experiencing any other symptoms (ex. SOB, nausea, vomiting, sweating)? Shortness of Breath  3. How long have you been experiencing CP? About a week  4. Is your CP continuous or coming and going? Continuous  5. Have you taken Nitroglycerin?No

## 2016-07-18 NOTE — H&P (Signed)
Cardiology Admission History and Physical:   Patient ID: Luke [name]; [id number]; 19-Sep-1930   Admission date: 07/18/2016  Primary Care Provider: System, Pcp Not In Primary Cardiologist: Dr. Burt Knack Primary Electrophysiologist:  None  Chief Complaint:  Chest pain  Patient Profile:   Luke [name] is a 83 y.o. male with a history of CAD s/p CABG x 5 in 2011, post op afib not on systemic anticoagulation, h/o DVT, HTN and HLD presented to ED with chest pain  History of Present Illness:   Luke [name] is a 83 y.o. male with a history of CAD s/p CABG x 5 in 2011, post op afib not on systemic anticoagulation, h/o DVT, HTN and HLD presented to ED with chest pain. He had chest pain was playing golf in 2001, cardiac catheterization was showed significant diagonal, LAD and left circumflex lesion. PCI procedure was complicated by dissection, he was subsequently transferred to the OR for emergent bypass surgery 5 with LIMA to LAD, sequential reverse SVG to D1/D2, sequential reverse SVG to OM 2/posterior descending of left circumflex. Since then, he has had multiple Myoview. Last Myoview was obtained on 02/17/2014 which showed EF 63%, medium-sized scar in the diagonal/OM territory with no ischemia, overall low risk study.  He has been doing well until 2 weeks ago, he started noticing a sharp stabbing pain over the left chest. It gradually transitioned to a pressure-like sensation in the genital area. It occurs both at rest and with exertion, however more so with exertion. Interestingly enough, he is able to push the lawnmower in the last 2 weeks without any chest discomfort. This morning, he started having numbness in his left hand as well. He thinks some of his symptom is reminiscent of the previous angina but he is not sure. He sought medical attention at [facility] ED, initial troponin was negative. EKG showed no significant changes. Cardiology was consulted for evaluation of chest pain.   Past  Medical History:  Diagnosis Date  . Atrial fibrillation ([place])   . CAD (coronary artery disease)     5 with the left internal mamary to the left anterior  descending coronary artery.  [name] Kitchen DVT femoral (deep venous thrombosis) with thrombophlebitis ([place])   . Dyslipidemia   . HTN (hypertension)     Past Surgical History:  Procedure Laterality Date  . CORONARY ARTERY BYPASS GRAFT  2001     Medications Prior to Admission: Prior to Admission medications   Medication Sig Start Date End Date Taking? Authorizing Provider  amLODipine (NORVASC) 10 MG tablet Take 1 tablet (10 mg total) by mouth daily. 04/30/16   Sherren Mocha, MD  aspirin 81 MG tablet Take 81 mg by mouth daily.      [provider]  atorvastatin (LIPITOR) 10 MG tablet Take one 10 mg tablet by mouth on Monday,Wednesday and Friday. 04/30/16   Sherren Mocha, MD  benazepril (LOTENSIN) 40 MG tablet Take 1 tablet (40 mg total) by mouth daily. 04/30/16   Sherren Mocha, MD  Coenzyme Q10 200 MG TABS Take 1 tablet by mouth daily.    [provider]  ezetimibe (ZETIA) 10 MG tablet Take 1 tablet (10 mg total) by mouth daily. 04/30/16   Sherren Mocha, MD  glucosamine-chondroitin 500-400 MG tablet Take 1 tablet by mouth 3 (three) times daily.    [provider]  metoprolol tartrate (LOPRESSOR) 25 MG tablet Take 1 tablet (25 mg total) by mouth 2 (two) times daily. 04/30/16   Sherren Mocha, MD  Multiple Vitamins-Minerals (CENTRUM SILVER PO) 1 tab po qd     [provider]  OMEGA-3 FATTY ACIDS PO Take 1,200 mg by mouth 2 (two) times daily.    [provider]     Allergies:    Allergies  Allergen Reactions  . Iodine Other (See Comments)    Patient doesn't remember  . Statins Other (See Comments)    REACTION: Reaction not known    Social History:   Social History   Social History  . Marital status: Married    Spouse name: N/A  . Number of children: N/A  . Years of education: N/A    Occupational History  . Not on file.   Social History Main Topics  . Smoking status: Former Research scientist (life sciences)  . Smokeless tobacco: Never Used     Comment: stopped in the 1950's  . Alcohol use No  . Drug use: No  . Sexual activity: No   Other Topics Concern  . Not on file   Social History Narrative   He lives in [place] with his wife.  He does not smoke.He drives about two hours a day which increases his risk for DVT.  He is retired.  He is a Psychologist, occupational at [employer].           Family History:   The patient's family history includes Cancer in his mother; Heart attack in his father; Heart disease in his father and mother.    ROS:  Please see the history of present illness.  All other ROS reviewed and negative.     Physical Exam/Data:   Vitals:   07/18/16 1711 07/18/16 1730 07/18/16 1845 07/18/16 1900  BP: 139/65 (!) 155/82 134/67 133/72  Pulse: 67 76 61 (!) 55  Resp: 18 15 16 13   Temp:  98.1 F (36.7 C)    TempSrc:  Oral    SpO2: 95% 97% 97% 95%   No intake or output data in the 24 hours ending 07/18/16 1922 There were no vitals filed for this visit. There is no height or weight on file to calculate BMI.  General:  Well nourished, well developed, in no acute distress HEENT: normal Lymph: no adenopathy Neck: no JVD Endocrine:  No thryomegaly Vascular: No carotid bruits; FA pulses 2+ bilaterally without bruits  Cardiac:  normal S1, S2; RRR; no murmur  Lungs:  clear to auscultation bilaterally, no wheezing, rhonchi or rales  Abd: soft, nontender, no hepatomegaly  Ext: trace edema Musculoskeletal:  No deformities, BUE and BLE strength normal and equal Skin: warm and dry  Neuro:  CNs 2-12 intact, no focal abnormalities noted Psych:  Normal affect    EKG:  The ECG that was done in ED was personally reviewed and demonstrates normal sinus rhythm, elevated J-point in V2, however not in adjacent leads. Otherwise no significant change compared to the  previous EKG.  Relevant CV Studies:  CABG 05/08/1999 PREOPERATIVE DIAGNOSIS:  Failed angioplasty with ongoing ischemia.  POSTOPERATIVE DIAGNOSIS:  Failed angioplasty with ongoing ischemia.  SURGICAL PROCEDURE:  Emergency coronary artery bypass grafting x 5 with the left internal mammary to the left anterior descending coronary artery, sequential reverse saphenous vein graft to the first and second diagonal, sequential reverse saphenous vein graft to the second obtuse marginal, and posterior descending coronary artery arising from the circumflex.    Myoview 02/17/2014 Impression Exercise Capacity:  Good exercise capacity. BP Response:  Hypertensive blood pressure response. Clinical Symptoms:  No significant symptoms noted. ECG Impression:  No significant ST segment change suggestive of ischemia. Comparison with Prior Nuclear Study: No significant change from previous study  Overall Impression:  Low risk stress nuclear study with a medium size scar in the [place] territory with no ischemia. .  LV Ejection Fraction: 63%.  LV Wall Motion:  Hypokinesis is the basal and mid anterolateral wall.      Laboratory Data:  Chemistry  Recent Labs Lab 07/18/16 1432  NA 139  K 4.5  CL 109  CO2 22  GLUCOSE 98  BUN 22*  CREATININE 1.18  CALCIUM 9.3  GFRNONAA 55*  GFRAA >60  ANIONGAP 8    No results for input(s): PROT, ALBUMIN, AST, ALT, ALKPHOS, BILITOT in the last 168 hours. Hematology  Recent Labs Lab 07/18/16 1432  WBC 6.4  RBC 4.19*  HGB 13.5  HCT 40.0  MCV 95.5  MCH 32.2  MCHC 33.8  RDW 12.9  PLT 158   Cardiac EnzymesNo results for input(s): TROPONINI in the last 168 hours.   Recent Labs Lab 07/18/16 1458  TROPIPOC 0.00    BNPNo results for input(s): BNP, PROBNP in the last 168 hours.  DDimer No results for input(s): DDIMER in the last 168 hours.  Radiology/Studies:  Dg Chest 2 View  Result Date: 07/18/2016 CLINICAL DATA:  LEFT lower chest  pain and tightness into back for 2 weeks, worse for 1 day with tingling in LEFT arm, shortness of breath, hypertension, former smoker, coronary artery disease, atrial fibrillation EXAM: CHEST  2 VIEW COMPARISON:  04/25/2004 FINDINGS: Normal heart size post CABG. Mediastinal contours and pulmonary vascularity normal. Lungs clear. No pleural effusion or pneumothorax. Scattered endplate spur formation and diffuse idiopathic skeletal hyperostosis of the thoracic spine. IMPRESSION: No acute abnormalities. Electronically Signed   By: Lavonia Dana M.D.   On: 07/18/2016 14:47    Assessment and Plan:   1. Chest pain with both typical and atypical features: Although he says he has more chest discomfort with exertion than at rest, in the last 2 weeks, he was able to push the lawnmower without any chest pain. Last myoview showed scar in the OM and diagonal territory in 2016. We'll discuss with M.D. regarding stress testing versus cardiac catheterization.  2. CAD status post 5 vessel CABG in 2001: Last Myoview in early 2016 which was low risk.  3. Postop A. fib: Seen after bypass surgery, isolated incident in 2001, has not had any significant recurrence. Not on systemic anticoagulation.  4. Hypertension: Blood pressure stable  5. Hyperlipidemia: Lipitor 10 mg every Monday Wednesday Friday.  6. History of DVT: Current symptom atypical for DVT  Severity of Illness: The appropriate patient status for this patient is OBSERVATION. Observation status is judged to be reasonable and necessary in order to provide the required intensity of service to ensure the patient's safety. The patient's presenting symptoms, physical exam findings, and initial radiographic and laboratory data in the context of their medical condition is felt to place them at decreased risk for further clinical deterioration. Furthermore, it is anticipated that the patient will be medically stable for discharge from the hospital within 2 midnights of  admission. The following factors support the patient status of observation.   " The patient's presenting symptoms include chest pain. " The physical exam findings include no significant heart failure. " The initial radiographic and laboratory data are EKG and CXR.     Luke [name], [place]  07/18/2016 7:22 PM   I have seen, examined and evaluated the patient  this pm along with Luke Deforest, PA.  After reviewing all the available data and chart, we discussed the patients laboratory, study & physical findings as well as symptoms in detail. I agree with his findings, examination as well as impression recommendations as per our discussion.    Luke [name] is a very pleasant 83 year old gentleman with a long-standing history of coronary disease dating back to urgent CABG back in 2001 with rotational atherectomy related coronary dissection leading to multivessel CABG. He has had multiple follow-up stress test would simply show the known anterior infarct with no ischemia. He now presents with about 2 weeks of intermittent chest discomfort that was initially a sharp discomfort but now is described as a squeezing sensation on the left side of his chest that is often worse with exertion. Came in today because it was also associated with some tingling in his left hand and arm. This is kind of consistent with a symptom he had when he came in and 2001 and is concerning for him.  The fact that this is been a progressively worsening symptom that is now occurring with minimal exertion or at rest makes me fearful that this is progressive angina/unstable angina. At a minimum, we will admit him under observation status to rule out MI, but with plan to proceed with cardiac catheterization after long discussion with the patient as to the different options. We discussed the option of either doing stress test versus simply doing catheterization for what sounds to me to be more acute process. After discussing the options with the  patient and his son (who has significant concerns based on his adverse outcome during his initial catheterization), we decided that he would feel more comfortable with a more definitive diagnosis. And I am concerned that this is an unstable process.  We discussed the risks, benefits alternatives and indications of heart catheterization with the knowledge that vein graft interventions would be more high risk then native coronary vessels especially in a 83 year old grafts.  Plan is to admit and place on IV heparin and cycle cardiac enzymes. We'll post for cath tomorrow morning.  He is on amlodipine which is good friend anginal along with a stable dose of beta blocker and ACE inhibitor. He is also on aspirin and statin. - As he is currently chest pain-free, we don't need nitroglycerin.  He is not on any evaluation for the diagnosis of atrial fibrillation noted above. This was a postop event and I don't think he had any breakthrough based on the clinic notes that read.   Cath Consent  Procedure:  Left Heart Catheterization with Native Coronary and Graft Angiography with possible Percutaneous Coronary Intervention  The procedure with Risks/Benefits/Alternatives and Indications was reviewed with the patient and his son.  All questions were answered.    Risks / Complications include, but not limited to: Death, MI, CVA/TIA, VF/VT (with defibrillation), Bradycardia (need for temporary pacer placement), contrast induced nephropathy, bleeding / bruising / hematoma / pseudoaneurysm, vascular or coronary injury (with possible emergent CT or Vascular Surgery), adverse medication reactions, infection.  Additional risks involving the use of radiation with the possibility of radiation burns and cancer were explained in detail.  The special risks of vein graft intervention with potential no reflow and MI were discussed in detail.  The patient (and family) voice understanding and agree to proceed.      Glenetta Hew, M.D., M.S. Interventional Cardiologist   Pager # [phone] Phone # [phone] [address] [place], [place]  [address]    

## 2016-07-18 NOTE — Telephone Encounter (Signed)
Called patient about his symptoms. Patient stated he has left chest tightness that radiates to his back and makes his left arm tingle. Patient complained of SOB with activity. Patient stated that this has been coming and going for about a week, and today it's lasted for 3 hours and continues. Advised patient to go to [facility] ED for evaluation. Patient stated he did not want to go to [facility] and he would like to know what Dr. Burt Knack thinks. Consulted Dr. Burt Knack, he recommend patient to go to ED. Called patient back and informed him of Dr. Antionette Char recommendations. Patient verbalized understanding and will go to ED at [facility].

## 2016-07-19 ENCOUNTER — Encounter (HOSPITAL_COMMUNITY)
Admission: EM | Disposition: A | Discharge status: Home / Self Care | Payer: Self-pay | Source: Home / Self Care | Attending: Emergency Medicine

## 2016-07-19 DIAGNOSIS — Z8249 Family history of ischemic heart disease and other diseases of the circulatory system: Secondary | ICD-10-CM | POA: Diagnosis not present

## 2016-07-19 DIAGNOSIS — Z883 Allergy status to other anti-infective agents status: Secondary | ICD-10-CM | POA: Diagnosis not present

## 2016-07-19 DIAGNOSIS — Z87891 Personal history of nicotine dependence: Secondary | ICD-10-CM | POA: Diagnosis not present

## 2016-07-19 DIAGNOSIS — Z951 Presence of aortocoronary bypass graft: Secondary | ICD-10-CM | POA: Diagnosis not present

## 2016-07-19 DIAGNOSIS — Z86718 Personal history of other venous thrombosis and embolism: Secondary | ICD-10-CM | POA: Diagnosis not present

## 2016-07-19 DIAGNOSIS — E785 Hyperlipidemia, unspecified: Secondary | ICD-10-CM | POA: Diagnosis not present

## 2016-07-19 DIAGNOSIS — I2542 Coronary artery dissection: Secondary | ICD-10-CM | POA: Diagnosis not present

## 2016-07-19 DIAGNOSIS — I10 Essential (primary) hypertension: Secondary | ICD-10-CM

## 2016-07-19 DIAGNOSIS — I2582 Chronic total occlusion of coronary artery: Secondary | ICD-10-CM | POA: Diagnosis not present

## 2016-07-19 DIAGNOSIS — Z7982 Long term (current) use of aspirin: Secondary | ICD-10-CM | POA: Diagnosis not present

## 2016-07-19 DIAGNOSIS — I25110 Atherosclerotic heart disease of native coronary artery with unstable angina pectoris: Secondary | ICD-10-CM | POA: Diagnosis not present

## 2016-07-19 HISTORY — PX: LEFT HEART CATH AND CORS/GRAFTS ANGIOGRAPHY: CATH118250

## 2016-07-19 LAB — TROPONIN I
Troponin I: <0.03 ng/mL (ref <0.03)
Troponin I: <0.03 ng/mL (ref <0.03)
Troponin I: <0.03 ng/mL (ref <0.03)

## 2016-07-19 LAB — BASIC METABOLIC PANEL
Anion gap: 7 (ref 5–15)
BUN: 15 mg/dL (ref 6–20)
CO2: 25 mmol/L (ref 22–32)
Calcium: 8.7 mg/dL — ABNORMAL LOW (ref 8.9–10.3)
Chloride: 110 mmol/L (ref 101–111)
Creatinine, Ser: 1.17 mg/dL (ref 0.61–1.24)
GFR calc Af Amer: >60 mL/min (ref >60)
GFR calc non Af Amer: 56 mL/min — ABNORMAL LOW (ref >60)
Glucose, Bld: 101 mg/dL — ABNORMAL HIGH (ref 65–99)
Potassium: 3.7 mmol/L (ref 3.5–5.1)
Sodium: 142 mmol/L (ref 135–145)

## 2016-07-19 LAB — HEPARIN LEVEL (UNFRACTIONATED)
Heparin Unfractionated: 0.76 IU/mL — ABNORMAL HIGH (ref 0.30–0.70)
Heparin Unfractionated: 0.55 IU/mL (ref 0.30–0.70)

## 2016-07-19 LAB — PROTIME-INR
INR: 1.05
Prothrombin Time: 13.7 seconds (ref 11.4–15.2)

## 2016-07-19 LAB — POCT ACTIVATED CLOTTING TIME: Activated Clotting Time: 202 seconds

## 2016-07-19 SURGERY — LEFT HEART CATH AND CORS/GRAFTS ANGIOGRAPHY
Anesthesia: LOCAL

## 2016-07-19 MED ORDER — SODIUM CHLORIDE 0.9 % IV SOLN: 250.0000 mL | INTRAVENOUS | Status: DC | PRN

## 2016-07-19 MED ORDER — FAMOTIDINE IN NACL 20-0.9 MG/50ML-% IV SOLN
20.0000 mg | Freq: Once | INTRAVENOUS | Status: AC
  Administered 2016-07-19: 20 mg via INTRAVENOUS
  Filled 2016-07-19: qty 50

## 2016-07-19 MED ORDER — METHYLPREDNISOLONE SODIUM SUCC 125 MG IJ SOLR
125.0000 mg | Freq: Once | INTRAMUSCULAR | Status: AC
  Administered 2016-07-19: 125 mg via INTRAVENOUS
  Filled 2016-07-19: qty 2

## 2016-07-19 MED ORDER — HEPARIN SODIUM (PORCINE) 1000 UNIT/ML IJ SOLN
INTRAMUSCULAR | Status: AC
  Filled 2016-07-19: qty 1

## 2016-07-19 MED ORDER — SODIUM CHLORIDE 0.9 % IV SOLN
INTRAVENOUS | Status: AC
  Administered 2016-07-19: 16:00:00 via INTRAVENOUS

## 2016-07-19 MED ORDER — LIDOCAINE HCL 1 % IJ SOLN
INTRAMUSCULAR | Status: AC
  Filled 2016-07-19: qty 20

## 2016-07-19 MED ORDER — ONDANSETRON HCL 4 MG/2ML IJ SOLN: 4.0000 mg | Freq: Four times a day (QID) | INTRAMUSCULAR | Status: DC | PRN

## 2016-07-19 MED ORDER — IOPAMIDOL (ISOVUE-370) INJECTION 76%
INTRAVENOUS | Status: DC | PRN
  Administered 2016-07-19: 80 mL via INTRA_ARTERIAL

## 2016-07-19 MED ORDER — LIDOCAINE HCL (PF) 1 % IJ SOLN
INTRAMUSCULAR | Status: DC | PRN
  Administered 2016-07-19: 2 mL via INTRADERMAL

## 2016-07-19 MED ORDER — MIDAZOLAM HCL 2 MG/2ML IJ SOLN
INTRAMUSCULAR | Status: DC | PRN
  Administered 2016-07-19: 1 mg via INTRAVENOUS

## 2016-07-19 MED ORDER — HEPARIN (PORCINE) IN NACL 2-0.9 UNIT/ML-% IJ SOLN
INTRAMUSCULAR | Status: AC | PRN
  Administered 2016-07-19 (×2): 1000 mL

## 2016-07-19 MED ORDER — HEPARIN (PORCINE) IN NACL 2-0.9 UNIT/ML-% IJ SOLN
INTRAMUSCULAR | Status: AC
  Filled 2016-07-19: qty 500

## 2016-07-19 MED ORDER — SODIUM CHLORIDE 0.9% FLUSH
3.0000 mL | Freq: Two times a day (BID) | INTRAVENOUS | Status: DC
  Administered 2016-07-19 – 2016-07-20 (×2): 3 mL via INTRAVENOUS

## 2016-07-19 MED ORDER — FENTANYL CITRATE (PF) 100 MCG/2ML IJ SOLN
INTRAMUSCULAR | Status: AC
  Filled 2016-07-19: qty 2

## 2016-07-19 MED ORDER — MIDAZOLAM HCL 2 MG/2ML IJ SOLN
INTRAMUSCULAR | Status: AC
  Filled 2016-07-19: qty 2

## 2016-07-19 MED ORDER — FENTANYL CITRATE (PF) 100 MCG/2ML IJ SOLN
INTRAMUSCULAR | Status: DC | PRN
  Administered 2016-07-19: 25 ug via INTRAVENOUS

## 2016-07-19 MED ORDER — IOPAMIDOL (ISOVUE-370) INJECTION 76%
INTRAVENOUS | Status: AC
  Filled 2016-07-19: qty 125

## 2016-07-19 MED ORDER — ACETAMINOPHEN 325 MG PO TABS: 650.0000 mg | ORAL_TABLET | ORAL | Status: DC | PRN

## 2016-07-19 MED ORDER — DIPHENHYDRAMINE HCL 50 MG/ML IJ SOLN
25.0000 mg | Freq: Once | INTRAMUSCULAR | Status: AC
  Administered 2016-07-19: 25 mg via INTRAVENOUS
  Filled 2016-07-19: qty 1

## 2016-07-19 MED ORDER — VERAPAMIL HCL 2.5 MG/ML IV SOLN
INTRAVENOUS | Status: DC | PRN
  Administered 2016-07-19: 10 mL via INTRA_ARTERIAL

## 2016-07-19 MED ORDER — SODIUM CHLORIDE 0.9% FLUSH: 3.0000 mL | INTRAVENOUS | Status: DC | PRN

## 2016-07-19 MED ORDER — HEPARIN (PORCINE) IN NACL 2-0.9 UNIT/ML-% IJ SOLN
INTRAMUSCULAR | Status: AC
Start: 2016-07-19 — End: 2016-07-19
  Filled 2016-07-19: qty 500

## 2016-07-19 MED ORDER — HEPARIN SODIUM (PORCINE) 1000 UNIT/ML IJ SOLN
INTRAMUSCULAR | Status: DC | PRN
  Administered 2016-07-19: 5000 [IU] via INTRAVENOUS

## 2016-07-19 SURGICAL SUPPLY — 15 items
CATH INFINITI 5 FR IM (CATHETERS) ×2 IMPLANT
CATH INFINITI 5FR AL1 (CATHETERS) ×2 IMPLANT
CATH INFINITI 5FR MULTPACK ANG (CATHETERS) ×2 IMPLANT
COVER PRB 48X5XTLSCP FOLD TPE (BAG) ×1 IMPLANT
COVER PROBE 5X48 (BAG) ×2
GLIDESHEATH SLEND SS 6F .021 (SHEATH) ×2 IMPLANT
GUIDEWIRE INQWIRE 1.5J.035X260 (WIRE) ×1 IMPLANT
INQWIRE 1.5J .035X260CM (WIRE) ×2
KIT HEART LEFT (KITS) ×2 IMPLANT
PACK CARDIAC CATHETERIZATION (CUSTOM PROCEDURE TRAY) ×2 IMPLANT
SHEATH PINNACLE 5F 10CM (SHEATH) ×2 IMPLANT
SYR MEDRAD MARK V 150ML (SYRINGE) ×2 IMPLANT
TRANSDUCER W/STOPCOCK (MISCELLANEOUS) ×2 IMPLANT
TUBING CIL FLEX 10 FLL-RA (TUBING) ×2 IMPLANT
WIRE EMERALD 3MM-J .035X150CM (WIRE) ×2 IMPLANT

## 2016-07-19 NOTE — Progress Notes (Signed)
ANTICOAGULATION CONSULT NOTE - Initial Consult  Pharmacy Consult for Heparin  Indication: chest pain/ACS  Allergies  Allergen Reactions  . Iodine Other (See Comments)    Patient doesn't remember  . Statins Other (See Comments)    REACTION: Reaction not known    Patient Measurements: Weight: 204 lb (92.5 kg) Heparin Dosing Weight: 90kg (previous wt)   Assessment: 85yom Hx CAD and CABG 2011 admitted with chest pain.  Plan to start heparin and evaluate chest pain.  CBC stable, no s/s bleeding noted.    First heparin level is high at 0.76.  Goal of Therapy:  Heparin level 0.3-0.7 units/ml Monitor platelets by anticoagulation protocol: Yes   Plan:  Decrease heparin gtt to 1,050 units/hr Monitor daily heparin level, CBC, s/s of bleed   Elenor Quinones, PharmD, [facility] Clinical Pharmacist Pager [phone] 07/19/2016 4:31 AM

## 2016-07-19 NOTE — Progress Notes (Signed)
ANTICOAGULATION CONSULT NOTE - Follow-up Consult  Pharmacy Consult for Heparin  Indication: chest pain/ACS  Allergies  Allergen Reactions  . Iodine Other (See Comments)    Patient doesn't remember  . Statins Other (See Comments)    REACTION: Reaction not known    Patient Measurements: Weight: 204 lb (92.5 kg) Heparin Dosing Weight: 90kg (previous wt)   Assessment: 85yom Hx CAD and CABG 2011 admitted with chest pain.  Plan to start heparin and evaluate chest pain.  CBC stable, no s/s bleeding noted.    Heparin level is therapeutic at 0.55 on lower dose of 1050 units/hr.  No bleeding noted.  Plan for cath today.   Goal of Therapy:  Heparin level 0.3-0.7 units/ml Monitor platelets by anticoagulation protocol: Yes   Plan:  Continue heparin gtt to 1,050 units/hr Monitor daily heparin level, CBC, s/s of bleed Follow-up post cath  Sloan Leiter, PharmD, BCPS Clinical Pharmacist Clinical Phone 07/19/2016 until 3:30 PM - #[phone] After hours, please call #[phone] 07/19/2016 1:08 PM

## 2016-07-19 NOTE — H&P (View-Only) (Signed)
Progress Note  Patient Name: Luke [name] Date of Encounter: 07/19/2016  Primary Cardiologist: Burt Knack   Subjective   No further chest pain, but mild discomfort in his upper back on the left side.   Inpatient Medications    Scheduled Meds: . amLODipine  10 mg Oral Daily  . aspirin  324 mg Oral NOW   Or  . aspirin  300 mg Rectal NOW  . aspirin EC  81 mg Oral Daily  . [START ON 07/20/2016] atorvastatin  10 mg Oral Once per day on Mon Wed Fri  . benazepril  40 mg Oral Daily  . diphenhydrAMINE  25 mg Intravenous Once  . ezetimibe  10 mg Oral Daily  . methylPREDNISolone (SOLU-MEDROL) injection  125 mg Intravenous Once  . metoprolol tartrate  25 mg Oral BID  . omega-3 acid ethyl esters  1 g Oral BID  . sodium chloride flush  3 mL Intravenous Q12H   Continuous Infusions: . sodium chloride    . sodium chloride 1 mL/kg/hr (07/19/16 0631)  . famotidine (PEPCID) IV    . heparin 1,050 Units/hr (07/19/16 0524)   PRN Meds: sodium chloride, acetaminophen, nitroGLYCERIN, ondansetron (ZOFRAN) IV, sodium chloride flush   Vital Signs    Vitals:   07/18/16 2100 07/18/16 2115 07/18/16 2130 07/18/16 2224  BP: (!) 143/66 (!) 151/66 (!) 150/66   Pulse: (!) 57 (!) 55 (!) 58   Resp: 16 16 16    Temp:      TempSrc:      SpO2: 95% 97% 97%   Weight:    204 lb (92.5 kg)    Intake/Output Summary (Last 24 hours) at 07/19/16 1308 Last data filed at 07/19/16 0615  Gross per 24 hour  Intake           370.68 ml  Output              900 ml  Net          -529.32 ml   Filed Weights   07/18/16 2224  Weight: 204 lb (92.5 kg)    Telemetry    SR with PACs - Personally Reviewed  ECG    SR, 1st degree AVB - Personally Reviewed  Physical Exam   General: Well developed, well nourished, older W male appearing in no acute distress. Head: Normocephalic, atraumatic.  Neck: Supple without bruits, JVD. Lungs:  Resp regular and unlabored, CTA. Heart: RRR, S1, S2, no S3, S4, murmur; no  rub. Abdomen: Soft, non-tender, non-distended with normoactive bowel sounds. No hepatomegaly. No rebound/guarding. No obvious abdominal masses. Extremities: No clubbing, cyanosis, edema. Distal pedal pulses are 2+ bilaterally. Neuro: Alert and oriented X 3. Moves all extremities spontaneously. Psych: Normal affect.  Labs    Chemistry Recent Labs Lab 07/18/16 1432 07/19/16 0346  NA 139 142  K 4.5 3.7  CL 109 110  CO2 22 25  GLUCOSE 98 101*  BUN 22* 15  CREATININE 1.18 1.17  CALCIUM 9.3 8.7*  GFRNONAA 55* 56*  GFRAA >60 >60  ANIONGAP 8 7     Hematology Recent Labs Lab 07/18/16 1432  WBC 6.4  RBC 4.19*  HGB 13.5  HCT 40.0  MCV 95.5  MCH 32.2  MCHC 33.8  RDW 12.9  PLT 158    Cardiac Enzymes Recent Labs Lab 07/18/16 2240 07/19/16 0346  TROPONINI <0.03 <0.03    Recent Labs Lab 07/18/16 1458  TROPIPOC 0.00     BNPNo results for input(s): BNP, PROBNP in the last  168 hours.   DDimer No results for input(s): DDIMER in the last 168 hours.    Radiology    Dg Chest 2 View  Result Date: 07/18/2016 CLINICAL DATA:  LEFT lower chest pain and tightness into back for 2 weeks, worse for 1 day with tingling in LEFT arm, shortness of breath, hypertension, former smoker, coronary artery disease, atrial fibrillation EXAM: CHEST  2 VIEW COMPARISON:  04/25/2004 FINDINGS: Normal heart size post CABG. Mediastinal contours and pulmonary vascularity normal. Lungs clear. No pleural effusion or pneumothorax. Scattered endplate spur formation and diffuse idiopathic skeletal hyperostosis of the thoracic spine. IMPRESSION: No acute abnormalities. Electronically Signed   By: Lavonia Dana M.D.   On: 07/18/2016 14:47    Cardiac Studies   N/A  Patient Profile     83 y.o. male with a history of CAD s/p CABG x 5 in 2011, post op afib not on systemic anticoagulation, h/o DVT, HTN and HLD presented to ED with chest pain.   Assessment & Plan    1. Unstable Angina: Presented with  intermittent chest pain over the past couple of days. Last myoview showed scar in the OM/diag territory in 2016. Given symptoms and discussion with the patient, planned for cath today. Trop neg x3.  -- IV heparin, ASA, statin, BB, ACEi  2. CAD s/p 5v CABG: continue current medical therapy  3. HTN: Stable  4. HL: on statin MWF (hx of intolerance)  5. Post op AFIB: only occurrence in 2001 s/p CABG. SR on telemetry with PACs.   Signed, Reino Bellis, NP  07/19/2016, 9:22 AM    I have seen, examined and evaluated the patient this AM along with Reino Bellis, NP-C.  After reviewing all the available data and chart, we discussed the patients laboratory, study & physical findings as well as symptoms in detail. I agree with her findings, examination as well as impression recommendations as per our discussion.    He has not really been all that mobile, is only been up to the bedside and occasionally to the bathroom. Has not noted any of the chest discomfort with this level of activity. Has ruled out for MI.  Continuing IV heparin as well as aspirin and statin beta blocker and ACE inhibitor. Plan for cardiac catheterization today  More plans based on results.    Glenetta Hew, M.D., M.S. Interventional Cardiologist   Pager # [phone] Phone # [phone] [address] [place], [place] [address]

## 2016-07-19 NOTE — Progress Notes (Addendum)
Site area: RFA Site Prior to Removal:  Level 0 Pressure Applied For:30 min Manual: yes   Patient Status During Pull:  stable Post Pull Site:  Level 0 Post Pull Instructions Given:  yes Post Pull Pulses Present: palpable Dressing Applied:  tegaderm Bedrest begins @ 1630 till  2030 Comments:

## 2016-07-19 NOTE — Progress Notes (Signed)
Chaplain presented to the patient, introduced self. Inquiry made as to whether the patient has completed an Advance Directive, he reports he has already completed one. Appreciative of the visit. [place] in [place] [phone]

## 2016-07-19 NOTE — Interval H&P Note (Signed)
Cath Lab Visit (complete for each Cath Lab visit)  Clinical Evaluation Leading to the Procedure:   ACS: Yes.    Non-ACS:    Anginal Classification: CCS IV  Anti-ischemic medical therapy: Minimal Therapy (1 class of medications)  Non-Invasive Test Results: No non-invasive testing performed  Prior CABG: Previous CABG      History and Physical Interval Note:  07/19/2016 2:52 PM  Luke [name]  has presented today for surgery, with the diagnosis of unstable angina  The various methods of treatment have been discussed with the patient and family. After consideration of risks, benefits and other options for treatment, the patient has consented to  Procedure(s): Left Heart Cath and Cors/Grafts Angiography (N/A) as a surgical intervention .  The patient's history has been reviewed, patient examined, no change in status, stable for surgery.  I have reviewed the patient's chart and labs.  Questions were answered to the patient's satisfaction.     Larae Grooms

## 2016-07-19 NOTE — Progress Notes (Signed)
Progress Note  Patient Name: Luke [name] Date of Encounter: 07/19/2016  Primary Cardiologist: Burt Knack   Subjective   No further chest pain, but mild discomfort in his upper back on the left side.   Inpatient Medications    Scheduled Meds: . amLODipine  10 mg Oral Daily  . aspirin  324 mg Oral NOW   Or  . aspirin  300 mg Rectal NOW  . aspirin EC  81 mg Oral Daily  . [START ON 07/20/2016] atorvastatin  10 mg Oral Once per day on Mon Wed Fri  . benazepril  40 mg Oral Daily  . diphenhydrAMINE  25 mg Intravenous Once  . ezetimibe  10 mg Oral Daily  . methylPREDNISolone (SOLU-MEDROL) injection  125 mg Intravenous Once  . metoprolol tartrate  25 mg Oral BID  . omega-3 acid ethyl esters  1 g Oral BID  . sodium chloride flush  3 mL Intravenous Q12H   Continuous Infusions: . sodium chloride    . sodium chloride 1 mL/kg/hr (07/19/16 0631)  . famotidine (PEPCID) IV    . heparin 1,050 Units/hr (07/19/16 0524)   PRN Meds: sodium chloride, acetaminophen, nitroGLYCERIN, ondansetron (ZOFRAN) IV, sodium chloride flush   Vital Signs    Vitals:   07/18/16 2100 07/18/16 2115 07/18/16 2130 07/18/16 2224  BP: (!) 143/66 (!) 151/66 (!) 150/66   Pulse: (!) 57 (!) 55 (!) 58   Resp: 16 16 16    Temp:      TempSrc:      SpO2: 95% 97% 97%   Weight:    204 lb (92.5 kg)    Intake/Output Summary (Last 24 hours) at 07/19/16 1601 Last data filed at 07/19/16 0615  Gross per 24 hour  Intake           370.68 ml  Output              900 ml  Net          -529.32 ml   Filed Weights   07/18/16 2224  Weight: 204 lb (92.5 kg)    Telemetry    SR with PACs - Personally Reviewed  ECG    SR, 1st degree AVB - Personally Reviewed  Physical Exam   General: Well developed, well nourished, older W male appearing in no acute distress. Head: Normocephalic, atraumatic.  Neck: Supple without bruits, JVD. Lungs:  Resp regular and unlabored, CTA. Heart: RRR, S1, S2, no S3, S4, murmur; no  rub. Abdomen: Soft, non-tender, non-distended with normoactive bowel sounds. No hepatomegaly. No rebound/guarding. No obvious abdominal masses. Extremities: No clubbing, cyanosis, edema. Distal pedal pulses are 2+ bilaterally. Neuro: Alert and oriented X 3. Moves all extremities spontaneously. Psych: Normal affect.  Labs    Chemistry Recent Labs Lab 07/18/16 1432 07/19/16 0346  NA 139 142  K 4.5 3.7  CL 109 110  CO2 22 25  GLUCOSE 98 101*  BUN 22* 15  CREATININE 1.18 1.17  CALCIUM 9.3 8.7*  GFRNONAA 55* 56*  GFRAA >60 >60  ANIONGAP 8 7     Hematology Recent Labs Lab 07/18/16 1432  WBC 6.4  RBC 4.19*  HGB 13.5  HCT 40.0  MCV 95.5  MCH 32.2  MCHC 33.8  RDW 12.9  PLT 158    Cardiac Enzymes Recent Labs Lab 07/18/16 2240 07/19/16 0346  TROPONINI <0.03 <0.03    Recent Labs Lab 07/18/16 1458  TROPIPOC 0.00     BNPNo results for input(s): BNP, PROBNP in the last  168 hours.   DDimer No results for input(s): DDIMER in the last 168 hours.    Radiology    Dg Chest 2 View  Result Date: 07/18/2016 CLINICAL DATA:  LEFT lower chest pain and tightness into back for 2 weeks, worse for 1 day with tingling in LEFT arm, shortness of breath, hypertension, former smoker, coronary artery disease, atrial fibrillation EXAM: CHEST  2 VIEW COMPARISON:  04/25/2004 FINDINGS: Normal heart size post CABG. Mediastinal contours and pulmonary vascularity normal. Lungs clear. No pleural effusion or pneumothorax. Scattered endplate spur formation and diffuse idiopathic skeletal hyperostosis of the thoracic spine. IMPRESSION: No acute abnormalities. Electronically Signed   By: Lavonia Dana M.D.   On: 07/18/2016 14:47    Cardiac Studies   N/A  Patient Profile     83 y.o. male with a history of CAD s/p CABG x 5 in 2011, post op afib not on systemic anticoagulation, h/o DVT, HTN and HLD presented to ED with chest pain.   Assessment & Plan    1. Unstable Angina: Presented with  intermittent chest pain over the past couple of days. Last myoview showed scar in the OM/diag territory in 2016. Given symptoms and discussion with the patient, planned for cath today. Trop neg x3.  -- IV heparin, ASA, statin, BB, ACEi  2. CAD s/p 5v CABG: continue current medical therapy  3. HTN: Stable  4. HL: on statin MWF (hx of intolerance)  5. Post op AFIB: only occurrence in 2001 s/p CABG. SR on telemetry with PACs.   Signed, Reino Bellis, NP  07/19/2016, 9:22 AM    I have seen, examined and evaluated the patient this AM along with Reino Bellis, NP-C.  After reviewing all the available data and chart, we discussed the patients laboratory, study & physical findings as well as symptoms in detail. I agree with her findings, examination as well as impression recommendations as per our discussion.    He has not really been all that mobile, is only been up to the bedside and occasionally to the bathroom. Has not noted any of the chest discomfort with this level of activity. Has ruled out for MI.  Continuing IV heparin as well as aspirin and statin beta blocker and ACE inhibitor. Plan for cardiac catheterization today  More plans based on results.    Glenetta Hew, M.D., M.S. Interventional Cardiologist   Pager # [phone] Phone # [phone] [address] [place], [place] [address]

## 2016-07-20 ENCOUNTER — Encounter (HOSPITAL_COMMUNITY): Payer: Self-pay | Admitting: Interventional Cardiology

## 2016-07-20 DIAGNOSIS — Z951 Presence of aortocoronary bypass graft: Secondary | ICD-10-CM | POA: Diagnosis not present

## 2016-07-20 DIAGNOSIS — I10 Essential (primary) hypertension: Secondary | ICD-10-CM | POA: Diagnosis not present

## 2016-07-20 DIAGNOSIS — R9431 Abnormal electrocardiogram [ECG] [EKG]: Secondary | ICD-10-CM | POA: Diagnosis not present

## 2016-07-20 DIAGNOSIS — R072 Precordial pain: Secondary | ICD-10-CM | POA: Diagnosis not present

## 2016-07-20 DIAGNOSIS — I25110 Atherosclerotic heart disease of native coronary artery with unstable angina pectoris: Secondary | ICD-10-CM | POA: Diagnosis not present

## 2016-07-20 LAB — CBC
HCT: 37.7 % — ABNORMAL LOW (ref 39.0–52.0)
Hemoglobin: 12.8 g/dL — ABNORMAL LOW (ref 13.0–17.0)
MCH: 32.3 pg (ref 26.0–34.0)
MCHC: 34 g/dL (ref 30.0–36.0)
MCV: 95.2 fL (ref 78.0–100.0)
Platelets: 138 10*3/uL — ABNORMAL LOW (ref 150–400)
RBC: 3.96 MIL/uL — ABNORMAL LOW (ref 4.22–5.81)
RDW: 12.7 % (ref 11.5–15.5)
WBC: 8.7 10*3/uL (ref 4.0–10.5)

## 2016-07-20 NOTE — Discharge Summary (Signed)
Discharge Summary    Patient ID: Luke [name], DOB/AGE: 81/06/1930 81 y.o.  Admit date: 07/18/2016 Discharge date: 07/20/2016  Primary Care Provider: System, [place] Not In Primary Cardiologist: Burt Knack  Discharge Diagnoses    Active Problems:   Unstable angina ([place])   Dyslipidemia, goal LDL below 70   Essential hypertension   Hx of CABG   Coronary artery disease involving native coronary artery of native heart with unstable angina pectoris (HCC)   Allergies Allergies  Allergen Reactions  . Iodine Other (See Comments)    Patient doesn't remember  . Statins Other (See Comments)    REACTION: Reaction not known    Diagnostic Studies/Procedures    LHC: 07/19/16  Conclusion     Ost Cx to Prox Cx lesion, 90 %stenosed.  Mid Cx lesion, 90 %stenosed.  Ost 1st Mrg to 1st Mrg lesion, 90 %stenosed.  Patent jump graft SVG to OM1 and O2  Mid LAD lesion, 75 %stenosed. LIMA to LAD is widely patent.  Ost 1st Diag lesion, 100 %stenosed.  Ost 2nd Diag lesion, 75 %stenosed.  Patent jump graft SVG to first and second diagonals. Mild lesion in the proximal graft.  The left ventricular systolic function is normal.  LV end diastolic pressure is mildly elevated.  The left ventricular ejection fraction is 50-55% by visual estimate.  There is no aortic valve stenosis.  Would not use left radial approach in the future if cath was needed, due to tortuosity.   Continue aggressive secondary prevention.   _____________   History of Present Illness     Luke [name] with a history of CAD s/p CABG x 5 in 2011, post op afib not on systemic anticoagulation, h/o DVT, HTN and HLD presented to ED with chest pain. He had chest pain was playing golf in 2001, cardiac catheterization was showed significant diagonal, LAD and left circumflex lesion. PCI procedure was complicated by dissection, he was subsequently transferred to the OR for emergent bypass  surgery 5 with LIMA to LAD, sequential reverse SVG to D1/D2, sequential reverse SVG to OM 2/posterior descending of left circumflex. Since then, he has had multiple Myoview. Last Myoview was obtained on 02/17/2014 which showed EF 63%, medium-sized scar in the diagonal/OM territory with no ischemia, overall low risk study.  He has been doing well until 2 weeks prior to admission, he started noticing a sharp stabbing pain over the left chest. It gradually transitioned to a pressure-like sensation in the genital area. It occurs both at rest and with exertion, however more so with exertion. Interestingly enough, he was able to push the lawnmower in the last 2 weeks without any chest discomfort. The morning of admission, he started having numbness in his left hand as well. He thinks some of his symptom is reminiscent of the previous angina but he is not sure. He sought medical attention at [facility] ED, initial troponin was negative. EKG showed no significant changes. Cardiology was consulted for evaluation of chest pain. Initial troponin was negative, but given history he was admitted for further work up.   Hospital Course     Trop neg x3. The options were discussed with the patient by Dr. Ellyn Hack and he elected to undergo definitive cardiac cath. Cath with Dr.Varanasi showed patent grafts. It was felt that his symptoms were musculoskeletal in nature. Post cath labs were stable. He was able to ambulate in the hallway without recent chest pain.  He was seen by Dr. Ellyn Hack and determined stable for discharge home. Follow up in the office has been arranged. Medications are listed below.  _____________  Discharge Vitals Blood pressure (!) 132/57, pulse 76, temperature 97.8 F (36.6 C), temperature source Oral, resp. rate 15, height 5\' 9"  (1.753 m), weight 199 lb 6.4 oz (90.4 kg), SpO2 93 %.  Filed Weights   07/18/16 2224 07/20/16 0406  Weight: 204 lb (92.5 kg) 199 lb 6.4 oz (90.4 kg)    Labs &  Radiologic Studies    CBC  Recent Labs  07/18/16 1432 07/20/16 0213  WBC 6.4 8.7  HGB 13.5 12.8*  HCT 40.0 37.7*  MCV 95.5 95.2  PLT 158 536*   Basic Metabolic Panel  Recent Labs  07/18/16 1432 07/19/16 0346  NA 139 142  K 4.5 3.7  CL 109 110  CO2 22 25  GLUCOSE 98 101*  BUN 22* 15  CREATININE 1.18 1.17  CALCIUM 9.3 8.7*   Liver Function Tests No results for input(s): AST, ALT, ALKPHOS, BILITOT, PROT, ALBUMIN in the last 72 hours. No results for input(s): LIPASE, AMYLASE in the last 72 hours. Cardiac Enzymes  Recent Labs  07/18/16 2240 07/19/16 0346 07/19/16 0921  TROPONINI <0.03 <0.03 <0.03   BNP Invalid input(s): POCBNP D-Dimer No results for input(s): DDIMER in the last 72 hours. Hemoglobin A1C No results for input(s): HGBA1C in the last 72 hours. Fasting Lipid Panel No results for input(s): CHOL, HDL, LDLCALC, TRIG, CHOLHDL, LDLDIRECT in the last 72 hours. Thyroid Function Tests No results for input(s): TSH, T4TOTAL, T3FREE, THYROIDAB in the last 72 hours.  Invalid input(s): FREET3 _____________  Dg Chest 2 View  Result Date: 07/18/2016 CLINICAL DATA:  LEFT lower chest pain and tightness into back for 2 weeks, worse for 1 day with tingling in LEFT arm, shortness of breath, hypertension, former smoker, coronary artery disease, atrial fibrillation EXAM: CHEST  2 VIEW COMPARISON:  04/25/2004 FINDINGS: Normal heart size post CABG. Mediastinal contours and pulmonary vascularity normal. Lungs clear. No pleural effusion or pneumothorax. Scattered endplate spur formation and diffuse idiopathic skeletal hyperostosis of the thoracic spine. IMPRESSION: No acute abnormalities. Electronically Signed   By: Lavonia Dana M.D.   On: 07/18/2016 14:47   Disposition   Pt is being discharged home today in good condition.  Follow-up Plans & Appointments    Follow-up Information    Sherren Mocha, MD Follow up.   Specialty:  Cardiology Why:  The office will call you  with a follow up appt Contact information: [address] [place] [address] [phone]          Discharge Instructions    Call MD for:  redness, tenderness, or signs of infection (pain, swelling, redness, odor or green/yellow discharge around incision site)    Complete by:  As directed    Diet - low sodium heart healthy    Complete by:  As directed    Discharge instructions    Complete by:  As directed    Groin Site Care Refer to this sheet in the next few weeks. These instructions provide you with information on caring for yourself after your procedure. Your caregiver may also give you more specific instructions. Your treatment has been planned according to current medical practices, but problems sometimes occur. Call your caregiver if you have any problems or questions after your procedure. HOME CARE INSTRUCTIONS You may shower 24 hours after the procedure. Remove the bandage (dressing) and gently wash  the site with plain soap and water. Gently pat the site dry.  Do not apply powder or lotion to the site.  Do not sit in a bathtub, swimming pool, or whirlpool for 5 to 7 days.  No bending, squatting, or lifting anything over 10 pounds (4.5 kg) as directed by your caregiver.  Inspect the site at least twice daily.  Do not drive home if you are discharged the same day of the procedure. Have someone else drive you.  You may drive 24 hours after the procedure unless otherwise instructed by your caregiver.  What to expect: Any bruising will usually fade within 1 to 2 weeks.  Blood that collects in the tissue (hematoma) may be painful to the touch. It should usually decrease in size and tenderness within 1 to 2 weeks.  SEEK IMMEDIATE MEDICAL CARE IF: You have unusual pain at the groin site or down the affected leg.  You have redness, warmth, swelling, or pain at the groin site.  You have drainage (other than a small amount of blood on the dressing).  You have  chills.  You have a fever or persistent symptoms for more than 72 hours.  You have a fever and your symptoms suddenly get worse.  Your leg becomes pale, cool, tingly, or numb.  You have heavy bleeding from the site. Hold pressure on the site. .   Radial Site Care Refer to this sheet in the next few weeks. These instructions provide you with information on caring for yourself after your procedure. Your caregiver may also give you more specific instructions. Your treatment has been planned according to current medical practices, but problems sometimes occur. Call your caregiver if you have any problems or questions after your procedure. HOME CARE INSTRUCTIONS You may shower the day after the procedure.Remove the bandage (dressing) and gently wash the site with plain soap and water.Gently pat the site dry.  Do not apply powder or lotion to the site.  Do not submerge the affected site in water for 3 to 5 days.  Inspect the site at least twice daily.  Do not flex or bend the affected arm for 24 hours.  No lifting over 5 pounds (2.3 kg) for 5 days after your procedure.  Do not drive home if you are discharged the same day of the procedure. Have someone else drive you.  You may drive 24 hours after the procedure unless otherwise instructed by your caregiver.  What to expect: Any bruising will usually fade within 1 to 2 weeks.  Blood that collects in the tissue (hematoma) may be painful to the touch. It should usually decrease in size and tenderness within 1 to 2 weeks.  SEEK IMMEDIATE MEDICAL CARE IF: You have unusual pain at the radial site.  You have redness, warmth, swelling, or pain at the radial site.  You have drainage (other than a small amount of blood on the dressing).  You have chills.  You have a fever or persistent symptoms for more than 72 hours.  You have a fever and your symptoms suddenly get worse.  Your arm becomes pale, cool, tingly, or numb.  You have heavy bleeding from  the site. Hold pressure on the site.   Increase activity slowly    Complete by:  As directed       Discharge Medications   Current Discharge Medication List    CONTINUE these medications which have NOT CHANGED   Details  amLODipine (NORVASC) 10 MG tablet Take 1  tablet (10 mg total) by mouth daily. Qty: 90 tablet, Refills: 3    Artificial Tear Ointment (DRY EYES OP) Apply 1 drop to eye daily as needed (dry eyes).    aspirin 81 MG tablet Take 81 mg by mouth daily.      atorvastatin (LIPITOR) 10 MG tablet Take one 10 mg tablet by mouth on Monday,Wednesday and Friday. Qty: 45 tablet, Refills: 3    benazepril (LOTENSIN) 40 MG tablet Take 1 tablet (40 mg total) by mouth daily. Qty: 90 tablet, Refills: 3    Coenzyme Q10 200 MG TABS Take 1 tablet by mouth daily.    ezetimibe (ZETIA) 10 MG tablet Take 1 tablet (10 mg total) by mouth daily. Qty: 14 tablet, Refills: 0    glucosamine-chondroitin 500-400 MG tablet Take 1 tablet by mouth daily.     metoprolol tartrate (LOPRESSOR) 25 MG tablet Take 1 tablet (25 mg total) by mouth 2 (two) times daily. Qty: 180 tablet, Refills: 3    Multiple Vitamins-Minerals (CENTRUM SILVER PO) Take 1 tablet by mouth daily. 1 tab po qd     OMEGA-3 FATTY ACIDS PO Take 1,200 mg by mouth 2 (two) times daily.          Outstanding Labs/Studies   N/a  Duration of Discharge Encounter   Greater than 30 minutes including physician time.  Signed, Reino Bellis NP-C 07/20/2016, 11:19 AM

## 2016-07-20 NOTE — Care Management Obs Status (Signed)
[facility] NOTIFICATION   Patient Details  Name: Luke [name] MRN: [id number] Date of Birth: [birth year]   Medicare Observation Status Notification Given:  Yes    Bethena Roys, RN 07/20/2016, 11:45 AM

## 2016-07-20 NOTE — Progress Notes (Addendum)
Removed TR band from left radial. Site is a level 0. Placed gauze and Tegaderm to site. Assisted patient to bathroom. Patient tolerated well and right groin is a level 0 as well. Will continue to monitor.

## 2016-07-23 DIAGNOSIS — M546 Pain in thoracic spine: Secondary | ICD-10-CM | POA: Diagnosis not present

## 2016-07-23 DIAGNOSIS — Z8639 Personal history of other endocrine, nutritional and metabolic disease: Secondary | ICD-10-CM | POA: Diagnosis not present

## 2016-07-23 DIAGNOSIS — I25728 Atherosclerosis of autologous artery coronary artery bypass graft(s) with other forms of angina pectoris: Secondary | ICD-10-CM | POA: Diagnosis not present

## 2016-07-23 DIAGNOSIS — Z09 Encounter for follow-up examination after completed treatment for conditions other than malignant neoplasm: Secondary | ICD-10-CM | POA: Diagnosis not present

## 2016-07-23 DIAGNOSIS — Z6831 Body mass index (BMI) 31.0-31.9, adult: Secondary | ICD-10-CM | POA: Diagnosis not present

## 2016-07-23 DIAGNOSIS — R079 Chest pain, unspecified: Secondary | ICD-10-CM | POA: Diagnosis not present

## 2016-07-23 DIAGNOSIS — M9902 Segmental and somatic dysfunction of thoracic region: Secondary | ICD-10-CM | POA: Diagnosis not present

## 2016-07-23 DIAGNOSIS — E7800 Pure hypercholesterolemia, unspecified: Secondary | ICD-10-CM | POA: Diagnosis not present

## 2016-07-30 DIAGNOSIS — M546 Pain in thoracic spine: Secondary | ICD-10-CM | POA: Diagnosis not present

## 2016-07-30 DIAGNOSIS — M9902 Segmental and somatic dysfunction of thoracic region: Secondary | ICD-10-CM | POA: Diagnosis not present

## 2016-08-13 DIAGNOSIS — M546 Pain in thoracic spine: Secondary | ICD-10-CM | POA: Diagnosis not present

## 2016-08-13 DIAGNOSIS — M9902 Segmental and somatic dysfunction of thoracic region: Secondary | ICD-10-CM | POA: Diagnosis not present

## 2016-08-15 ENCOUNTER — Encounter: Payer: Self-pay | Admitting: Cardiovascular Disease

## 2016-08-15 ENCOUNTER — Ambulatory Visit (INDEPENDENT_AMBULATORY_CARE_PROVIDER_SITE_OTHER): Payer: Medicare Other | Admitting: Cardiovascular Disease

## 2016-08-15 VITALS — BP 148/58 | HR 64 | Ht 69.0 in | Wt 204.8 lb

## 2016-08-15 DIAGNOSIS — I25810 Atherosclerosis of coronary artery bypass graft(s) without angina pectoris: Secondary | ICD-10-CM | POA: Diagnosis not present

## 2016-08-15 NOTE — Progress Notes (Signed)
Cardiology Office Note Date:  08/15/2016   ID:  Luke, [name] 03-05-1930, MRN [id number]  PCP:  Michel Harrow PA-C  Cardiologist:  Sherren Mocha, MD    Chief Complaint  Patient presents with  . Follow-up     History of Present Illness: Luke [name] is a 83 y.o. male who presents for Follow-up of coronary artery disease. The patient underwent remote CABG about 15 years ago. He had a stress test in 2017 demonstrating no major areas of ischemia and his LV function was normal at 63%. He was recently hospitalized with chest pain 07/18/2016 when he ruled out for MI. He underwent cardiac catheterization demonstrating patency of all of his bypass grafts. He was suspected to have noncardiac chest pain.  He is here alone today for hospital follow-up. He has been doing fairly well. He continues to have some pain in the chest that feels like a pulling sensation. It is not exacerbated by physical exertion. He also has some numbness in his left arm at times. He denies orthopnea, PND, or heart palpitations. He has exertional dyspnea which is unchanged over time.   Past Medical History:  Diagnosis Date  . Atrial fibrillation ([place])   . CAD (coronary artery disease)     5 with the left internal mamary to the left anterior  descending coronary artery.  [name] Kitchen DVT femoral (deep venous thrombosis) with thrombophlebitis ([place])   . Dyslipidemia   . HTN (hypertension)     Past Surgical History:  Procedure Laterality Date  . CORONARY ARTERY BYPASS GRAFT  2001  . LEFT HEART CATH AND CORS/GRAFTS ANGIOGRAPHY N/A 07/19/2016   Procedure: Left Heart Cath and Cors/Grafts Angiography;  Surgeon: Jettie Booze, MD;  Location: [facility];  Service: Cardiovascular;  Laterality: N/A;    Current Outpatient Prescriptions  Medication Sig Dispense Refill  . amLODipine (NORVASC) 10 MG tablet Take 1 tablet (10 mg total) by mouth daily. 90 tablet 3  . Artificial Tear Ointment (DRY EYES OP) Place  1 drop into both eyes daily as needed (dry eyes).     [name] Kitchen aspirin 81 MG tablet Take 81 mg by mouth daily.      [name] Kitchen atorvastatin (LIPITOR) 10 MG tablet Take one 10 mg tablet by mouth on Monday,Wednesday and Friday. 45 tablet 3  . benazepril (LOTENSIN) 40 MG tablet Take 1 tablet (40 mg total) by mouth daily. 90 tablet 3  . Coenzyme Q10 200 MG TABS Take 1 tablet by mouth daily.    [name] Kitchen ezetimibe (ZETIA) 10 MG tablet Take 1 tablet (10 mg total) by mouth daily. 14 tablet 0  . glucosamine-chondroitin 500-400 MG tablet Take 1 tablet by mouth daily.     . metoprolol tartrate (LOPRESSOR) 25 MG tablet Take 1 tablet (25 mg total) by mouth 2 (two) times daily. 180 tablet 3  . Multiple Vitamins-Minerals (CENTRUM SILVER PO) Take 1 tablet by mouth daily.     . OMEGA-3 FATTY ACIDS PO Take 1,200 mg by mouth 2 (two) times daily.     No current facility-administered medications for this visit.     Allergies:   Iodine and Statins   Social History:  The patient  reports that he has quit smoking. He has never used smokeless tobacco. He reports that he does not drink alcohol or use drugs.   Family History:  The patient's family history includes Cancer in his mother; Coronary artery disease in his unknown relative; Heart attack in his father; Heart disease  in his father and mother.   ROS:  Please see the history of present illness.  Otherwise, review of systems is positive for Visual changes, cough, diarrhea, back pain, muscle pain, easy bruising, excessive fatigue, anxiety.  All other systems are reviewed and negative.   PHYSICAL EXAM: VS:  BP (!) 148/58   Pulse 64   Ht 5\' 9"  (1.753 m)   Wt 204 lb 12.8 oz (92.9 kg)   BMI 30.24 kg/m  , BMI Body mass index is 30.24 kg/m. GEN: Well nourished, well developed, pleasant elderly male in no acute distress  HEENT: normal  Neck: no JVD, no masses. No carotid bruits Cardiac: RRR without murmur or gallop                Respiratory:  clear to auscultation bilaterally, normal  work of breathing GI: soft, nontender, nondistended, + BS MS: no deformity or atrophy  Ext: no pretibial edema, pedal pulses 2+= bilaterally Skin: warm and dry, no rash Neuro:  Strength and sensation are intact Psych: euthymic mood, full affect  EKG:  EKG is not ordered today.  Recent Labs: 05/04/2016: ALT 14 07/19/2016: BUN 15; Creatinine, Ser 1.17; Potassium 3.7; Sodium 142 07/20/2016: Hemoglobin 12.8; Platelets 138   Lipid Panel     Component Value Date/Time   CHOL 140 05/04/2016 1005   TRIG 103 05/04/2016 1005   TRIG 89 11/15/2005 0820   HDL 37 (L) 05/04/2016 1005   CHOLHDL 3.8 05/04/2016 1005   CHOLHDL 4.2 02/28/2015 1541   VLDL 23 02/28/2015 1541   LDLCALC 82 05/04/2016 1005   LDLDIRECT 139.9 06/18/2006 1008      Wt Readings from Last 3 Encounters:  08/15/16 204 lb 12.8 oz (92.9 kg)  07/20/16 199 lb 6.4 oz (90.4 kg)  04/30/16 204 lb (92.5 kg)     Cardiac Studies Reviewed: Cardiac catheterization 07/19/2016: Coronary Findings   Dominance: Left  Left Anterior Descending  Mid LAD lesion, 75% stenosed.  First Diagonal Branch  Ost 1st Diag lesion, 100% stenosed.  Second Diagonal [employer] 2nd Diag lesion, 75% stenosed.  Left Circumflex  Ost Cx to Prox Cx lesion, 90% stenosed.  Mid Cx lesion, 90% stenosed.  First Obtuse Marginal Branch  Ost 1st Mrg to 1st Mrg lesion, 90% stenosed.  Graft Angiography  LIMA Graft to Dist LAD  Sequential saphenous Graft to 1st Mrg, Lat 1st Mrg, 3rd Mrg  SVG.  Sequential saphenous Graft to 1st Diag, 2nd Diag  SVG.  Mid Graft lesion before 1st Diag, 25% stenosed.  Wall Motion              Left Heart   Left Ventricle The left ventricular size is normal. The left ventricular systolic function is normal. LV end diastolic pressure is mildly elevated. The left ventricular ejection fraction is 50-55% by visual estimate. There are LV function abnormalities due to segmental dysfunction.    Aortic Valve There is no aortic valve  stenosis.    Coronary Diagrams   Diagnostic Diagram          ASSESSMENT AND PLAN: 1.  Coronary artery disease, native vessel, with atypical chest pain: Recent hospital evaluation reviewed. The patient's radiographic data, laboratory data, cardiac catheterization study him straighten no objective findings worrisome for cardiac chest pain. The patient is reassured today. He will continue to explore causes of noncardiac chest discomfort with his primary physician as needed.  2. Hyperlipidemia: The patient is treated with a statin drug. His cholesterol is 140, LDL 82,  HDL 37  3. Hypertension: Blood pressure is controlled. His systolic blood pressure is mildly elevated today and these been under a lot of stress this morning.  Current medicines are reviewed with the patient today.  The patient does not have concerns regarding medicines.  Labs/ tests ordered today include:  No orders of the defined types were placed in this encounter.   Disposition:   FU one year  Signed, Sherren Mocha, MD  08/15/2016 5:23 PM    [address], [place], [place]  [address] Phone: [phone]; Fax: [phone]

## 2016-08-15 NOTE — Patient Instructions (Signed)
Medication Instructions:  Your physician recommends that you continue on your current medications as directed. Please refer to the Current Medication list given to you today.   Labwork: None Ordered   Testing/Procedures: None Ordered   Follow-Up: Your physician wants you to follow-up in: 1 year with Dr. Burt Knack.  You will receive a reminder letter in the mail two months in advance. If you don't receive a letter, please call our office to schedule the follow-up appointment.   If you need a refill on your cardiac medications before your next appointment, please call your pharmacy.   Thank you for choosing [facility]! Christen Bame, RN [phone]

## 2016-09-03 DIAGNOSIS — M546 Pain in thoracic spine: Secondary | ICD-10-CM | POA: Diagnosis not present

## 2016-09-03 DIAGNOSIS — M9902 Segmental and somatic dysfunction of thoracic region: Secondary | ICD-10-CM | POA: Diagnosis not present

## 2016-10-22 ENCOUNTER — Other Ambulatory Visit: Payer: Self-pay | Admitting: *Deleted

## 2016-10-22 MED ORDER — AMLODIPINE BESYLATE 10 MG PO TABS: 10.0000 mg | ORAL_TABLET | Freq: Every day | ORAL | 0 refills | Status: DC

## 2016-10-22 MED ORDER — ATORVASTATIN CALCIUM 10 MG PO TABS: ORAL_TABLET | ORAL | 0 refills | Status: DC

## 2016-10-22 MED ORDER — BENAZEPRIL HCL 40 MG PO TABS: 40.0000 mg | ORAL_TABLET | Freq: Every day | ORAL | 0 refills | Status: DC

## 2016-10-22 MED ORDER — EZETIMIBE 10 MG PO TABS: 10.0000 mg | ORAL_TABLET | Freq: Every day | ORAL | 0 refills | Status: DC

## 2016-10-22 MED ORDER — METOPROLOL TARTRATE 25 MG PO TABS
25.0000 mg | ORAL_TABLET | Freq: Two times a day (BID) | ORAL | 0 refills | Status: DC
Start: 2016-10-22 — End: 2017-02-01

## 2016-10-22 NOTE — Telephone Encounter (Signed)
Patient called and requested a thirty day supply of his medications be sent to [facility] in [place].

## 2016-10-31 DIAGNOSIS — Z23 Encounter for immunization: Secondary | ICD-10-CM | POA: Diagnosis not present

## 2016-10-31 DIAGNOSIS — M7541 Impingement syndrome of right shoulder: Secondary | ICD-10-CM | POA: Diagnosis not present

## 2016-11-01 DIAGNOSIS — M546 Pain in thoracic spine: Secondary | ICD-10-CM | POA: Diagnosis not present

## 2016-11-01 DIAGNOSIS — M9902 Segmental and somatic dysfunction of thoracic region: Secondary | ICD-10-CM | POA: Diagnosis not present

## 2016-11-16 ENCOUNTER — Emergency Department (HOSPITAL_BASED_OUTPATIENT_CLINIC_OR_DEPARTMENT_OTHER)
Admission: EM | Admit: 2016-11-16 | Discharge: 2016-11-16 | Disposition: A | Discharge status: Home / Self Care | Payer: Medicare Other | Source: Home / Self Care | Attending: Emergency Medicine | Admitting: Emergency Medicine

## 2016-11-16 ENCOUNTER — Emergency Department (HOSPITAL_BASED_OUTPATIENT_CLINIC_OR_DEPARTMENT_OTHER): Payer: Medicare Other

## 2016-11-16 ENCOUNTER — Other Ambulatory Visit: Payer: Self-pay

## 2016-11-16 ENCOUNTER — Encounter (HOSPITAL_BASED_OUTPATIENT_CLINIC_OR_DEPARTMENT_OTHER): Payer: Self-pay

## 2016-11-16 DIAGNOSIS — Y9301 Activity, walking, marching and hiking: Secondary | ICD-10-CM | POA: Insufficient documentation

## 2016-11-16 DIAGNOSIS — Z951 Presence of aortocoronary bypass graft: Secondary | ICD-10-CM | POA: Insufficient documentation

## 2016-11-16 DIAGNOSIS — Z7982 Long term (current) use of aspirin: Secondary | ICD-10-CM | POA: Insufficient documentation

## 2016-11-16 DIAGNOSIS — Y999 Unspecified external cause status: Secondary | ICD-10-CM

## 2016-11-16 DIAGNOSIS — Y929 Unspecified place or not applicable: Secondary | ICD-10-CM | POA: Insufficient documentation

## 2016-11-16 DIAGNOSIS — I2510 Atherosclerotic heart disease of native coronary artery without angina pectoris: Secondary | ICD-10-CM

## 2016-11-16 DIAGNOSIS — W0110XA Fall on same level from slipping, tripping and stumbling with subsequent striking against unspecified object, initial encounter: Secondary | ICD-10-CM | POA: Insufficient documentation

## 2016-11-16 DIAGNOSIS — Z87891 Personal history of nicotine dependence: Secondary | ICD-10-CM

## 2016-11-16 DIAGNOSIS — I10 Essential (primary) hypertension: Secondary | ICD-10-CM | POA: Insufficient documentation

## 2016-11-16 DIAGNOSIS — S6991XA Unspecified injury of right wrist, hand and finger(s), initial encounter: Secondary | ICD-10-CM | POA: Diagnosis not present

## 2016-11-16 DIAGNOSIS — S61214A Laceration without foreign body of right ring finger without damage to nail, initial encounter: Secondary | ICD-10-CM | POA: Diagnosis not present

## 2016-11-16 DIAGNOSIS — Z79899 Other long term (current) drug therapy: Secondary | ICD-10-CM | POA: Insufficient documentation

## 2016-11-16 DIAGNOSIS — Z23 Encounter for immunization: Secondary | ICD-10-CM | POA: Insufficient documentation

## 2016-11-16 DIAGNOSIS — S61224A Laceration with foreign body of right ring finger without damage to nail, initial encounter: Secondary | ICD-10-CM | POA: Insufficient documentation

## 2016-11-16 MED ORDER — CEPHALEXIN 500 MG PO CAPS: 500.0000 mg | ORAL_CAPSULE | Freq: Four times a day (QID) | ORAL | 0 refills | Status: DC

## 2016-11-16 MED ORDER — LIDOCAINE HCL 2 % IJ SOLN
INTRAMUSCULAR | Status: AC
  Filled 2016-11-16: qty 20

## 2016-11-16 MED ORDER — LIDOCAINE HCL (PF) 1 % IJ SOLN: 5.0000 mL | Freq: Once | INTRAMUSCULAR | Status: DC

## 2016-11-16 MED ORDER — TETANUS-DIPHTH-ACELL PERTUSSIS 5-2.5-18.5 LF-MCG/0.5 IM SUSP
0.5000 mL | Freq: Once | INTRAMUSCULAR | Status: AC
  Administered 2016-11-16: 0.5 mL via INTRAMUSCULAR
  Filled 2016-11-16: qty 0.5

## 2016-11-16 NOTE — ED Notes (Signed)
ED Provider at bedside. 

## 2016-11-16 NOTE — ED Triage Notes (Signed)
Pt states he tripped/fell approx 30 min PTA-injured right hand/middle ring fingers and left elbow-NAD-steady gait

## 2016-11-16 NOTE — Discharge Instructions (Signed)
Call Dr. Brennan Bailey office.  He will need to follow-up in around a week to 10 days

## 2016-11-16 NOTE — ED Notes (Signed)
Suture Cart to room. Temporary Wet to dry dressing placed per MD.

## 2016-11-16 NOTE — ED Provider Notes (Signed)
[facility] EMERGENCY DEPARTMENT Provider Note   CSN: [id number] Arrival date & time: 11/16/16  1551     History   Chief Complaint Chief Complaint  Patient presents with  . Fall    HPI Luke [name] is a 83 y.o. male.  HPI Patient presents with right hand injury after a fall.  Tripped and fell landing on his right hand.  Also scraped his left elbow but no real pain.  States no neck pain is not his head.  History of A. fib and was previously been on Coumadin but is no longer on it.  No numbness or weakness.  Has wound on the dorsal aspect of the right ring finger. Past Medical History:  Diagnosis Date  . Atrial fibrillation ([place])   . CAD (coronary artery disease)     5 with the left internal mamary to the left anterior  descending coronary artery.  [name] Kitchen DVT femoral (deep venous thrombosis) with thrombophlebitis ([place])   . Dyslipidemia   . HTN (hypertension)     Patient Active Problem List   Diagnosis Date Noted  . Chest pain 07/18/2016  . Abnormal EKG   . Hx of CABG   . Coronary artery disease involving native coronary artery of native heart with unstable angina pectoris ([place])   . Unstable angina ([place])   . Dyslipidemia, goal LDL below 70 04/16/2008  . Essential hypertension 04/16/2008  . CAD, ARTERY BYPASS GRAFT 04/16/2008  . DEEP VENOUS THROMBOPHLEBITIS, HX OF 04/16/2008  . ATRIAL FIBRILLATION, HX OF 04/16/2008    Past Surgical History:  Procedure Laterality Date  . CORONARY ARTERY BYPASS GRAFT  2001       Home Medications    Prior to Admission medications   Medication Sig Start Date End Date Taking? Authorizing Provider  amLODipine (NORVASC) 10 MG tablet Take 1 tablet (10 mg total) by mouth daily. 10/22/16   Sherren Mocha, MD  Artificial Tear Ointment (DRY EYES OP) Place 1 drop into both eyes daily as needed (dry eyes).     [provider]  aspirin 81 MG tablet Take 81 mg by mouth daily.      [provider]  atorvastatin  (LIPITOR) 10 MG tablet Take one 10 mg tablet by mouth on Monday,Wednesday and Friday. 10/22/16   Sherren Mocha, MD  benazepril (LOTENSIN) 40 MG tablet Take 1 tablet (40 mg total) by mouth daily. 10/22/16   Sherren Mocha, MD  cephALEXin (KEFLEX) 500 MG capsule Take 1 capsule (500 mg total) 4 (four) times daily by mouth. 11/16/16   Davonna Belling, MD  Coenzyme Q10 200 MG TABS Take 1 tablet by mouth daily.    [provider]  ezetimibe (ZETIA) 10 MG tablet Take 1 tablet (10 mg total) by mouth daily. 10/22/16   Sherren Mocha, MD  glucosamine-chondroitin 500-400 MG tablet Take 1 tablet by mouth daily.     [provider]  metoprolol tartrate (LOPRESSOR) 25 MG tablet Take 1 tablet (25 mg total) by mouth 2 (two) times daily. 10/22/16   Sherren Mocha, MD  Multiple Vitamins-Minerals (CENTRUM SILVER PO) Take 1 tablet by mouth daily.     [provider]  OMEGA-3 FATTY ACIDS PO Take 1,200 mg by mouth 2 (two) times daily.    [provider]    Family History Family History  Problem Relation Age of Onset  . Heart disease Mother   . Cancer Mother   . Heart disease Father   . Heart attack Father   .  Coronary artery disease Unknown     Social History Social History   Tobacco Use  . Smoking status: Former Research scientist (life sciences)  . Smokeless tobacco: Never Used  . Tobacco comment: stopped in the 1950's  Substance Use Topics  . Alcohol use: Yes    Alcohol/week: 0.0 oz    Comment: occ  . Drug use: No     Allergies   Statins   Review of Systems Review of Systems  Constitutional: Negative for appetite change.  HENT: Negative for congestion.   Respiratory: Negative for shortness of breath.   Cardiovascular: Negative for chest pain.  Gastrointestinal: Negative for abdominal pain.  Musculoskeletal: Negative for back pain.       Right hand injury  Skin: Positive for wound.  Neurological: Negative for weakness and numbness.  Hematological: Does not bruise/bleed  easily.     Physical Exam Updated Vital Signs Ht 5\' 9"  (1.753 m)   Wt 90.7 kg (200 lb)   BMI 29.53 kg/m   Physical Exam  Constitutional: He appears well-developed.  HENT:  Head: Normocephalic and atraumatic.  Eyes: EOM are normal.  Neck: Neck supple.  Cardiovascular: Normal rate.  Pulmonary/Chest: Effort normal.  Abdominal: Soft. There is no tenderness.  Musculoskeletal: He exhibits tenderness.  Large wound over dorsum of right ring finger.  Goes from the DIP joint over the PIP joint.  Good flexion and extension at the MCP PIP and DIP joints.  Visualized through range of motion.  Joint space appears intact.  There is however some contamination.  The wound is approximately 5 cm long total.  Over the middle finger dorsal aspect there is also a skin tear.  There is a thin flap that is avulsed.  Flexion extension also intact on that finger.  Neurological: He is alert.  Skin: Skin is warm.       ED Treatments / Results  Labs (all labs ordered are listed, but only abnormal results are displayed) Labs Reviewed - No data to display  EKG  EKG Interpretation None       Radiology Dg Finger Ring Right  Result Date: 11/16/2016 CLINICAL DATA:  Fall.  Laceration EXAM: RIGHT RING FINGER 2+V COMPARISON:  None. FINDINGS: Soft tissue laceration. Small bone fragment adjacent to the DIP joint medially. This could be acute or chronic avulsion fracture. Joint space normal. IMPRESSION: Soft tissue laceration Small avulsion fracture the DIP joint which could be acute or chronic. Electronically Signed   By: Franchot Gallo M.D.   On: 11/16/2016 16:54    Procedures .[name] KitchenLaceration Repair Date/Time: 11/16/2016 6:00 PM Performed by: Davonna Belling, MD Authorized by: Davonna Belling, MD   Consent:    Consent obtained:  Verbal   Consent given by:  Patient   Risks discussed:  Infection, pain, retained foreign body, tendon damage, poor wound healing, poor cosmetic result and need for  additional repair   Alternatives discussed:  No treatment, delayed treatment and observation Anesthesia (see MAR for exact dosages):    Anesthesia method:  Nerve block   Block location:  Digital block right ring finger   Block needle gauge:  25 G   Block anesthetic:  Lidocaine 1% w/o epi   Block injection procedure:  Anatomic landmarks identified, introduced needle, negative aspiration for blood and incremental injection   Block outcome:  Anesthesia achieved Laceration details:    Length (cm):  5 Repair type:    Repair type:  Intermediate Pre-procedure details:    Preparation:  Patient was prepped and draped in usual  sterile fashion and imaging obtained to evaluate for foreign bodies Exploration:    Hemostasis achieved with:  Direct pressure   Wound exploration: wound explored through full range of motion and entire depth of wound probed and visualized     Wound extent: foreign bodies/material     Wound extent: no tendon damage noted     Contaminated: yes   Treatment:    Area cleansed with:  Saline   Amount of cleaning:  Extensive   Irrigation solution:  Sterile saline   Irrigation volume:  1l   Irrigation method:  Pressure wash   Visualized foreign bodies/material removed: yes   Skin repair:    Repair method:  Sutures   Suture size:  4-0   Wound skin closure material used: vicryl rapide.   Suture technique:  Simple interrupted   Number of sutures:  19 (And one horizontal mattress) Approximation:    Approximation:  Close   Vermilion border: well-aligned   Post-procedure details:    Dressing:  Antibiotic ointment   Patient tolerance of procedure:  Tolerated well, no immediate complications Comments:     Copiously irrigated but still possibility of retained small foreign bodies.  Will empirically give antibiotics.   (including critical care time)  Medications Ordered in ED Medications  lidocaine (PF) (XYLOCAINE) 1 % injection 5 mL (not administered)  lidocaine  (XYLOCAINE) 2 % (with pres) injection (not administered)  Tdap (BOOSTRIX) injection 0.5 mL (0.5 mLs Intramuscular Given 11/16/16 1615)     Initial Impression / Assessment and Plan / ED Course  I have reviewed the triage vital signs and the nursing notes.  Pertinent labs & imaging results that were available during my care of the patient were reviewed by me and considered in my medical decision making (see chart for details).    Patient with large laceration on finger.  No clear fracture seen.  Joint appears intact.  Good flexion and extension.  We will empirically give antibiotics.  Sutured but is somewhat dirty wound.  Will give antibiotics.  Copiously irrigated.  Will have follow-up with hand surgery.  Final Clinical Impressions(s) / ED Diagnoses   Final diagnoses:  Laceration of right ring finger with foreign body without damage to nail, initial encounter    ED Discharge Orders        Ordered    cephALEXin (KEFLEX) 500 MG capsule  4 times daily     11/16/16 Mervyn Gay, MD 11/16/16 [phone]

## 2016-11-18 ENCOUNTER — Other Ambulatory Visit: Payer: Self-pay

## 2016-11-18 ENCOUNTER — Inpatient Hospital Stay (HOSPITAL_BASED_OUTPATIENT_CLINIC_OR_DEPARTMENT_OTHER)
Admission: EM | Admit: 2016-11-18 | Discharge: 2016-11-20 | DRG: 581 | Disposition: A | Discharge status: Home / Self Care | Payer: Medicare Other | Attending: Orthopedic Surgery | Admitting: Orthopedic Surgery

## 2016-11-18 ENCOUNTER — Emergency Department (HOSPITAL_BASED_OUTPATIENT_CLINIC_OR_DEPARTMENT_OTHER): Payer: Medicare Other

## 2016-11-18 ENCOUNTER — Emergency Department (HOSPITAL_COMMUNITY): Payer: Medicare Other | Admitting: Anesthesiology

## 2016-11-18 ENCOUNTER — Encounter (HOSPITAL_COMMUNITY)
Admission: EM | Disposition: A | Discharge status: Home / Self Care | Payer: Self-pay | Source: Home / Self Care | Attending: Orthopedic Surgery

## 2016-11-18 ENCOUNTER — Encounter (HOSPITAL_BASED_OUTPATIENT_CLINIC_OR_DEPARTMENT_OTHER): Payer: Self-pay | Admitting: Emergency Medicine

## 2016-11-18 ENCOUNTER — Emergency Department (HOSPITAL_COMMUNITY): Payer: Medicare Other

## 2016-11-18 DIAGNOSIS — Z951 Presence of aortocoronary bypass graft: Secondary | ICD-10-CM

## 2016-11-18 DIAGNOSIS — S66801A Unspecified injury of other specified muscles, fascia and tendons at wrist and hand level, right hand, initial encounter: Secondary | ICD-10-CM

## 2016-11-18 DIAGNOSIS — Z888 Allergy status to other drugs, medicaments and biological substances status: Secondary | ICD-10-CM

## 2016-11-18 DIAGNOSIS — S61214A Laceration without foreign body of right ring finger without damage to nail, initial encounter: Secondary | ICD-10-CM | POA: Diagnosis present

## 2016-11-18 DIAGNOSIS — S61212A Laceration without foreign body of right middle finger without damage to nail, initial encounter: Secondary | ICD-10-CM | POA: Diagnosis present

## 2016-11-18 DIAGNOSIS — L089 Local infection of the skin and subcutaneous tissue, unspecified: Secondary | ICD-10-CM | POA: Diagnosis not present

## 2016-11-18 DIAGNOSIS — B9689 Other specified bacterial agents as the cause of diseases classified elsewhere: Secondary | ICD-10-CM | POA: Diagnosis present

## 2016-11-18 DIAGNOSIS — Z86718 Personal history of other venous thrombosis and embolism: Secondary | ICD-10-CM

## 2016-11-18 DIAGNOSIS — Z7982 Long term (current) use of aspirin: Secondary | ICD-10-CM

## 2016-11-18 DIAGNOSIS — D649 Anemia, unspecified: Secondary | ICD-10-CM | POA: Diagnosis not present

## 2016-11-18 DIAGNOSIS — I10 Essential (primary) hypertension: Secondary | ICD-10-CM | POA: Diagnosis present

## 2016-11-18 DIAGNOSIS — Z79899 Other long term (current) drug therapy: Secondary | ICD-10-CM

## 2016-11-18 DIAGNOSIS — E785 Hyperlipidemia, unspecified: Secondary | ICD-10-CM | POA: Diagnosis present

## 2016-11-18 DIAGNOSIS — L03113 Cellulitis of right upper limb: Secondary | ICD-10-CM | POA: Diagnosis not present

## 2016-11-18 DIAGNOSIS — S60511A Abrasion of right hand, initial encounter: Secondary | ICD-10-CM | POA: Diagnosis not present

## 2016-11-18 DIAGNOSIS — R918 Other nonspecific abnormal finding of lung field: Secondary | ICD-10-CM | POA: Diagnosis not present

## 2016-11-18 DIAGNOSIS — I4891 Unspecified atrial fibrillation: Secondary | ICD-10-CM | POA: Diagnosis present

## 2016-11-18 DIAGNOSIS — S66901A Unspecified injury of unspecified muscle, fascia and tendon at wrist and hand level, right hand, initial encounter: Secondary | ICD-10-CM

## 2016-11-18 DIAGNOSIS — L03011 Cellulitis of right finger: Secondary | ICD-10-CM | POA: Diagnosis present

## 2016-11-18 DIAGNOSIS — Z87891 Personal history of nicotine dependence: Secondary | ICD-10-CM

## 2016-11-18 DIAGNOSIS — Z23 Encounter for immunization: Secondary | ICD-10-CM | POA: Diagnosis not present

## 2016-11-18 DIAGNOSIS — I2510 Atherosclerotic heart disease of native coronary artery without angina pectoris: Secondary | ICD-10-CM | POA: Diagnosis present

## 2016-11-18 DIAGNOSIS — S61401A Unspecified open wound of right hand, initial encounter: Secondary | ICD-10-CM

## 2016-11-18 DIAGNOSIS — W208XXA Other cause of strike by thrown, projected or falling object, initial encounter: Secondary | ICD-10-CM | POA: Diagnosis present

## 2016-11-18 DIAGNOSIS — R9431 Abnormal electrocardiogram [ECG] [EKG]: Secondary | ICD-10-CM | POA: Diagnosis not present

## 2016-11-18 HISTORY — DX: Unspecified open wound of right hand, initial encounter: S61.401A

## 2016-11-18 HISTORY — DX: Unspecified injury of unspecified muscle, fascia and tendon at wrist and hand level, right hand, initial encounter: S66.901A

## 2016-11-18 HISTORY — PX: I&D EXTREMITY: SHX5045

## 2016-11-18 LAB — CBC WITH DIFFERENTIAL/PLATELET
Basophils Absolute: 0 10*3/uL (ref 0.0–0.1)
Basophils Relative: 0 %
Eosinophils Absolute: 0 10*3/uL (ref 0.0–0.7)
Eosinophils Relative: 0 %
HCT: 37.7 % — ABNORMAL LOW (ref 39.0–52.0)
Hemoglobin: 12.6 g/dL — ABNORMAL LOW (ref 13.0–17.0)
Lymphocytes Relative: 9 %
Lymphs Abs: 1.1 10*3/uL (ref 0.7–4.0)
MCH: 32.7 pg (ref 26.0–34.0)
MCHC: 33.4 g/dL (ref 30.0–36.0)
MCV: 97.9 fL (ref 78.0–100.0)
Monocytes Absolute: 1.2 10*3/uL — ABNORMAL HIGH (ref 0.1–1.0)
Monocytes Relative: 9 %
Neutro Abs: 10.5 10*3/uL — ABNORMAL HIGH (ref 1.7–7.7)
Neutrophils Relative %: 82 %
Platelets: 154 10*3/uL (ref 150–400)
RBC: 3.85 MIL/uL — ABNORMAL LOW (ref 4.22–5.81)
RDW: 12.4 % (ref 11.5–15.5)
WBC: 12.8 10*3/uL — ABNORMAL HIGH (ref 4.0–10.5)

## 2016-11-18 LAB — TYPE AND SCREEN
ABO/RH(D): A POS
Antibody Screen: NEGATIVE

## 2016-11-18 LAB — BASIC METABOLIC PANEL
Anion gap: 6 (ref 5–15)
BUN: 20 mg/dL (ref 6–20)
CO2: 28 mmol/L (ref 22–32)
Calcium: 9 mg/dL (ref 8.9–10.3)
Chloride: 102 mmol/L (ref 101–111)
Creatinine, Ser: 1.41 mg/dL — ABNORMAL HIGH (ref 0.61–1.24)
GFR calc Af Amer: 51 mL/min — ABNORMAL LOW (ref >60)
GFR calc non Af Amer: 44 mL/min — ABNORMAL LOW (ref >60)
Glucose, Bld: 111 mg/dL — ABNORMAL HIGH (ref 65–99)
Potassium: 4 mmol/L (ref 3.5–5.1)
Sodium: 136 mmol/L (ref 135–145)

## 2016-11-18 LAB — ABO/RH: ABO/RH(D): A POS

## 2016-11-18 SURGERY — IRRIGATION AND DEBRIDEMENT EXTREMITY
Anesthesia: General | Site: Finger | Laterality: Right

## 2016-11-18 MED ORDER — ONDANSETRON HCL 4 MG/2ML IJ SOLN: 4.0000 mg | Freq: Once | INTRAMUSCULAR | Status: DC | PRN

## 2016-11-18 MED ORDER — 0.9 % SODIUM CHLORIDE (POUR BTL) OPTIME
TOPICAL | Status: DC | PRN
  Administered 2016-11-18: 1000 mL

## 2016-11-18 MED ORDER — ONDANSETRON HCL 4 MG/2ML IJ SOLN
INTRAMUSCULAR | Status: DC | PRN
  Administered 2016-11-18: 4 mg via INTRAVENOUS

## 2016-11-18 MED ORDER — PHENYLEPHRINE HCL 10 MG/ML IJ SOLN
INTRAMUSCULAR | Status: DC | PRN
  Administered 2016-11-18: 120 ug via INTRAVENOUS
  Administered 2016-11-18: 80 ug via INTRAVENOUS

## 2016-11-18 MED ORDER — AMLODIPINE BESYLATE 10 MG PO TABS
10.0000 mg | ORAL_TABLET | Freq: Every day | ORAL | Status: DC
  Administered 2016-11-19 – 2016-11-20 (×2): 10 mg via ORAL
  Filled 2016-11-18 (×2): qty 1

## 2016-11-18 MED ORDER — ONDANSETRON HCL 4 MG/2ML IJ SOLN: 4.0000 mg | Freq: Four times a day (QID) | INTRAMUSCULAR | Status: DC | PRN

## 2016-11-18 MED ORDER — ACETAMINOPHEN 325 MG PO TABS
650.0000 mg | ORAL_TABLET | ORAL | Status: DC | PRN
  Administered 2016-11-19: 650 mg via ORAL
  Filled 2016-11-18: qty 2

## 2016-11-18 MED ORDER — LIDOCAINE 2% (20 MG/ML) 5 ML SYRINGE
INTRAMUSCULAR | Status: AC
  Filled 2016-11-18: qty 5

## 2016-11-18 MED ORDER — FENTANYL CITRATE (PF) 250 MCG/5ML IJ SOLN
INTRAMUSCULAR | Status: AC
  Filled 2016-11-18: qty 5

## 2016-11-18 MED ORDER — EZETIMIBE 10 MG PO TABS
10.0000 mg | ORAL_TABLET | Freq: Every day | ORAL | Status: DC
Start: 2016-11-19 — End: 2016-11-20
  Administered 2016-11-19 – 2016-11-20 (×2): 10 mg via ORAL
  Filled 2016-11-18 (×2): qty 1

## 2016-11-18 MED ORDER — CHLORHEXIDINE GLUCONATE 4 % EX LIQD: 60.0000 mL | Freq: Once | CUTANEOUS | Status: DC

## 2016-11-18 MED ORDER — METOCLOPRAMIDE HCL 5 MG/ML IJ SOLN: 5.0000 mg | Freq: Three times a day (TID) | INTRAMUSCULAR | Status: DC | PRN

## 2016-11-18 MED ORDER — POLYETHYLENE GLYCOL 3350 17 G PO PACK: 17.0000 g | PACK | Freq: Every day | ORAL | Status: DC | PRN

## 2016-11-18 MED ORDER — SODIUM CHLORIDE 0.9 % IR SOLN
Status: DC | PRN
  Administered 2016-11-18: 3000 mL

## 2016-11-18 MED ORDER — SODIUM CHLORIDE 0.9 % IV SOLN
Freq: Once | INTRAVENOUS | Status: AC
  Administered 2016-11-18: 17:00:00 via INTRAVENOUS

## 2016-11-18 MED ORDER — PROPOFOL 500 MG/50ML IV EMUL
INTRAVENOUS | Status: DC | PRN
  Administered 2016-11-18: 150 ug/kg/min via INTRAVENOUS

## 2016-11-18 MED ORDER — FENTANYL CITRATE (PF) 100 MCG/2ML IJ SOLN
INTRAMUSCULAR | Status: DC | PRN
  Administered 2016-11-18: 50 ug via INTRAVENOUS

## 2016-11-18 MED ORDER — PROPOFOL 10 MG/ML IV BOLUS
INTRAVENOUS | Status: AC
  Filled 2016-11-18: qty 20

## 2016-11-18 MED ORDER — CLINDAMYCIN PHOSPHATE 600 MG/50ML IV SOLN
600.0000 mg | Freq: Once | INTRAVENOUS | Status: AC
  Administered 2016-11-18: 600 mg via INTRAVENOUS
  Filled 2016-11-18: qty 50

## 2016-11-18 MED ORDER — OXYCODONE HCL 5 MG PO TABS
5.0000 mg | ORAL_TABLET | ORAL | Status: DC | PRN
  Administered 2016-11-18 – 2016-11-20 (×7): 5 mg via ORAL
  Filled 2016-11-18 (×7): qty 1

## 2016-11-18 MED ORDER — PROPOFOL 10 MG/ML IV BOLUS
INTRAVENOUS | Status: DC | PRN
  Administered 2016-11-18: 180 mg via INTRAVENOUS

## 2016-11-18 MED ORDER — METOCLOPRAMIDE HCL 5 MG PO TABS: 5.0000 mg | ORAL_TABLET | Freq: Three times a day (TID) | ORAL | Status: DC | PRN

## 2016-11-18 MED ORDER — CLINDAMYCIN PHOSPHATE 600 MG/50ML IV SOLN
600.0000 mg | Freq: Four times a day (QID) | INTRAVENOUS | Status: AC
  Administered 2016-11-19 (×3): 600 mg via INTRAVENOUS
  Filled 2016-11-18 (×4): qty 50

## 2016-11-18 MED ORDER — CEPHALEXIN 500 MG PO CAPS
500.0000 mg | ORAL_CAPSULE | Freq: Three times a day (TID) | ORAL | Status: DC
  Administered 2016-11-19 – 2016-11-20 (×6): 500 mg via ORAL
  Filled 2016-11-18 (×7): qty 1

## 2016-11-18 MED ORDER — BENAZEPRIL HCL 20 MG PO TABS
40.0000 mg | ORAL_TABLET | Freq: Every day | ORAL | Status: DC
  Administered 2016-11-19 – 2016-11-20 (×2): 40 mg via ORAL
  Filled 2016-11-18 (×2): qty 2

## 2016-11-18 MED ORDER — ONDANSETRON HCL 4 MG PO TABS: 4.0000 mg | ORAL_TABLET | Freq: Four times a day (QID) | ORAL | Status: DC | PRN

## 2016-11-18 MED ORDER — SUCCINYLCHOLINE CHLORIDE 200 MG/10ML IV SOSY
PREFILLED_SYRINGE | INTRAVENOUS | Status: AC
  Filled 2016-11-18: qty 10

## 2016-11-18 MED ORDER — SODIUM CHLORIDE 0.9 % IV SOLN
INTRAVENOUS | Status: DC
  Administered 2016-11-19: 50 mL/h via INTRAVENOUS

## 2016-11-18 MED ORDER — FENTANYL CITRATE (PF) 100 MCG/2ML IJ SOLN
25.0000 ug | Freq: Once | INTRAMUSCULAR | Status: AC
  Administered 2016-11-18: 25 ug via INTRAVENOUS
  Filled 2016-11-18: qty 2

## 2016-11-18 MED ORDER — METOPROLOL TARTRATE 25 MG PO TABS
25.0000 mg | ORAL_TABLET | Freq: Two times a day (BID) | ORAL | Status: DC
  Administered 2016-11-19 – 2016-11-20 (×3): 25 mg via ORAL
  Filled 2016-11-18 (×3): qty 1

## 2016-11-18 MED ORDER — LIDOCAINE 2% (20 MG/ML) 5 ML SYRINGE
INTRAMUSCULAR | Status: DC | PRN
  Administered 2016-11-18: 80 mg via INTRAVENOUS

## 2016-11-18 MED ORDER — SUCCINYLCHOLINE CHLORIDE 20 MG/ML IJ SOLN
INTRAMUSCULAR | Status: DC | PRN
  Administered 2016-11-18: 50 mg via INTRAVENOUS

## 2016-11-18 MED ORDER — ONDANSETRON HCL 4 MG/2ML IJ SOLN
INTRAMUSCULAR | Status: AC
  Filled 2016-11-18: qty 2

## 2016-11-18 MED ORDER — ACETAMINOPHEN 650 MG RE SUPP: 650.0000 mg | RECTAL | Status: DC | PRN

## 2016-11-18 MED ORDER — DOCUSATE SODIUM 100 MG PO CAPS
100.0000 mg | ORAL_CAPSULE | Freq: Two times a day (BID) | ORAL | Status: DC
  Administered 2016-11-18 – 2016-11-20 (×4): 100 mg via ORAL
  Filled 2016-11-18 (×4): qty 1

## 2016-11-18 MED ORDER — ASPIRIN EC 81 MG PO TBEC
81.0000 mg | DELAYED_RELEASE_TABLET | Freq: Every day | ORAL | Status: DC
  Administered 2016-11-19 – 2016-11-20 (×3): 81 mg via ORAL
  Filled 2016-11-18 (×3): qty 1

## 2016-11-18 MED ORDER — COENZYME Q10 200 MG PO TABS: 1.0000 | ORAL_TABLET | Freq: Every day | ORAL | Status: DC

## 2016-11-18 MED ORDER — FENTANYL CITRATE (PF) 100 MCG/2ML IJ SOLN
25.0000 ug | INTRAMUSCULAR | Status: DC | PRN
  Administered 2016-11-18 (×2): 25 ug via INTRAVENOUS

## 2016-11-18 MED ORDER — GLUCOSAMINE-CHONDROITIN 500-400 MG PO TABS: 1.0000 | ORAL_TABLET | Freq: Every day | ORAL | Status: DC

## 2016-11-18 MED ORDER — ACETAMINOPHEN 650 MG RE SUPP: 650.0000 mg | Freq: Once | RECTAL | Status: DC

## 2016-11-18 MED ORDER — LACTATED RINGERS IV SOLN
INTRAVENOUS | Status: DC | PRN
  Administered 2016-11-18: 18:00:00 via INTRAVENOUS

## 2016-11-18 MED ORDER — FENTANYL CITRATE (PF) 100 MCG/2ML IJ SOLN
INTRAMUSCULAR | Status: AC
  Filled 2016-11-18: qty 2

## 2016-11-18 MED ORDER — MORPHINE SULFATE (PF) 4 MG/ML IV SOLN: 2.0000 mg | INTRAVENOUS | Status: DC | PRN

## 2016-11-18 MED ORDER — ATORVASTATIN CALCIUM 10 MG PO TABS
10.0000 mg | ORAL_TABLET | ORAL | Status: DC
  Administered 2016-11-19: 10 mg via ORAL
  Filled 2016-11-18: qty 1

## 2016-11-18 SURGICAL SUPPLY — 62 items
BANDAGE ACE 4X5 VEL STRL LF (GAUZE/BANDAGES/DRESSINGS) IMPLANT
BANDAGE ELASTIC 3 VELCRO ST LF (GAUZE/BANDAGES/DRESSINGS) ×2 IMPLANT
BNDG CMPR 9X4 STRL LF SNTH (GAUZE/BANDAGES/DRESSINGS) ×1
BNDG COHESIVE 4X5 TAN STRL (GAUZE/BANDAGES/DRESSINGS) IMPLANT
BNDG ESMARK 4X9 LF (GAUZE/BANDAGES/DRESSINGS) ×2 IMPLANT
BNDG GAUZE ELAST 4 BULKY (GAUZE/BANDAGES/DRESSINGS) ×2 IMPLANT
BRUSH SCRUB SURG 4.25 DISP (MISCELLANEOUS) ×2 IMPLANT
CORD BIPOLAR FORCEPS 12FT (ELECTRODE) ×2 IMPLANT
COVER SURGICAL LIGHT HANDLE (MISCELLANEOUS) ×2 IMPLANT
CUFF TOURNIQUET SINGLE 18IN (TOURNIQUET CUFF) ×2 IMPLANT
CUFF TOURNIQUET SINGLE 24IN (TOURNIQUET CUFF) IMPLANT
CUFF TOURNIQUET SINGLE 34IN LL (TOURNIQUET CUFF) IMPLANT
DRAPE IMP U-DRAPE 54X76 (DRAPES) IMPLANT
DRAPE U-SHAPE 47X51 STRL (DRAPES) IMPLANT
DRSG ADAPTIC 3X8 NADH LF (GAUZE/BANDAGES/DRESSINGS) IMPLANT
DRSG EMULSION OIL 3X3 NADH (GAUZE/BANDAGES/DRESSINGS) ×2 IMPLANT
DRSG PAD ABDOMINAL 8X10 ST (GAUZE/BANDAGES/DRESSINGS) IMPLANT
DURAPREP 26ML APPLICATOR (WOUND CARE) IMPLANT
ELECT REM PT RETURN 9FT ADLT (ELECTROSURGICAL) ×2
ELECTRODE REM PT RTRN 9FT ADLT (ELECTROSURGICAL) ×1 IMPLANT
FACESHIELD WRAPAROUND (MASK) IMPLANT
GAUZE SPONGE 4X4 12PLY STRL (GAUZE/BANDAGES/DRESSINGS) IMPLANT
GAUZE SPONGE 4X4 12PLY STRL LF (GAUZE/BANDAGES/DRESSINGS) ×2 IMPLANT
GLOVE BIOGEL PI ORTHO PRO 7.5 (GLOVE)
GLOVE BIOGEL PI ORTHO PRO SZ8 (GLOVE) ×1
GLOVE ORTHO TXT STRL SZ7.5 (GLOVE) IMPLANT
GLOVE PI ORTHO PRO STRL 7.5 (GLOVE) IMPLANT
GLOVE PI ORTHO PRO STRL SZ8 (GLOVE) ×1 IMPLANT
GLOVE SURG ORTHO 8.5 STRL (GLOVE) ×2 IMPLANT
GOWN STRL REUS W/ TWL LRG LVL3 (GOWN DISPOSABLE) ×1 IMPLANT
GOWN STRL REUS W/ TWL XL LVL3 (GOWN DISPOSABLE) ×1 IMPLANT
GOWN STRL REUS W/TWL LRG LVL3 (GOWN DISPOSABLE) ×2
GOWN STRL REUS W/TWL XL LVL3 (GOWN DISPOSABLE) ×2
HANDPIECE INTERPULSE COAX TIP (DISPOSABLE)
IV CATH 18G X1.75 CATHLON (IV SOLUTION) ×2 IMPLANT
JUMPSUIT WHITE XL DISP (PROTECTIVE WEAR) ×2 IMPLANT
KIT BASIN OR (CUSTOM PROCEDURE TRAY) ×2 IMPLANT
KIT ROOM TURNOVER OR (KITS) ×2 IMPLANT
MANIFOLD NEPTUNE II (INSTRUMENTS) ×2 IMPLANT
NEEDLE 22X1 1/2 (OR ONLY) (NEEDLE) ×2 IMPLANT
NS IRRIG 1000ML POUR BTL (IV SOLUTION) ×2 IMPLANT
PACK ORTHO EXTREMITY (CUSTOM PROCEDURE TRAY) ×2 IMPLANT
PAD ARMBOARD 7.5X6 YLW CONV (MISCELLANEOUS) ×4 IMPLANT
PENCIL BUTTON HOLSTER BLD 10FT (ELECTRODE) IMPLANT
SET HNDPC FAN SPRY TIP SCT (DISPOSABLE) IMPLANT
SPONGE LAP 18X18 X RAY DECT (DISPOSABLE) IMPLANT
SPONGE LAP 4X18 X RAY DECT (DISPOSABLE) IMPLANT
STOCKINETTE IMPERVIOUS 9X36 MD (GAUZE/BANDAGES/DRESSINGS) IMPLANT
SUT ETHILON 2 0 FS 18 (SUTURE) IMPLANT
SUT ETHILON 3 0 PS 1 (SUTURE) ×6 IMPLANT
SUT VIC AB 2-0 CT1 27 (SUTURE)
SUT VIC AB 2-0 CT1 TAPERPNT 27 (SUTURE) IMPLANT
SWAB COLLECTION DEVICE MRSA (MISCELLANEOUS) ×2 IMPLANT
SWAB CULTURE ESWAB REG 1ML (MISCELLANEOUS) ×2 IMPLANT
SYR 3ML LL SCALE MARK (SYRINGE) ×2 IMPLANT
SYRINGE 20CC LL (MISCELLANEOUS) ×2 IMPLANT
TOWEL OR 17X24 6PK STRL BLUE (TOWEL DISPOSABLE) ×2 IMPLANT
TOWEL OR 17X26 10 PK STRL BLUE (TOWEL DISPOSABLE) ×2 IMPLANT
TUBE CONNECTING 12X1/4 (SUCTIONS) ×2 IMPLANT
UNDERPAD 30X30 (UNDERPADS AND DIAPERS) ×2 IMPLANT
WATER STERILE IRR 1000ML POUR (IV SOLUTION) ×2 IMPLANT
YANKAUER SUCT BULB TIP NO VENT (SUCTIONS) ×2 IMPLANT

## 2016-11-18 NOTE — Brief Op Note (Signed)
11/18/2016  7:49 PM  PATIENT:  Luke [name]  83 y.o. male  PRE-OPERATIVE DIAGNOSIS:  INFECTED RIGHT LONG AND RING FINGER  POST-OPERATIVE DIAGNOSIS:  INFECTED RIGHT LONG AND RING FINGER  PROCEDURE:  Procedure(s): IRRIGATION AND DEBRIDEMENT RIGHT LONG AND RING FINGER (Right)  SURGEON:  Surgeon(s) and Role:    Netta Cedars, MD - Primary  PHYSICIAN ASSISTANT:   ASSISTANTS: none   ANESTHESIA:   general  EBL:  5 mL   BLOOD ADMINISTERED:none  DRAINS: none   LOCAL MEDICATIONS USED:  NONE  SPECIMEN:  Source of Specimen:  right ring finger swab  DISPOSITION OF SPECIMEN:  micro  COUNTS:  YES  TOURNIQUET:  * Missing tourniquet times found for documented tourniquets in log: [id number] *  DICTATION: .Other Dictation: Dictation Number [id number]  PLAN OF CARE: Admit to inpatient   PATIENT DISPOSITION:  PACU - hemodynamically stable.   Delay start of Pharmacological VTE agent (>24hrs) due to surgical blood loss or risk of bleeding: not applicable

## 2016-11-18 NOTE — Transfer of Care (Signed)
Immediate Anesthesia Transfer of Care Note  Patient: Luke [name]  Procedure(s) Performed: IRRIGATION AND DEBRIDEMENT RIGHT LONG AND RING FINGER (Right Finger)  Patient Location: PACU  Anesthesia Type:General  Level of Consciousness: awake, alert  and oriented  Airway & Oxygen Therapy: Patient Spontanous Breathing and Patient connected to face mask oxygen  Post-op Assessment: Report given to RN and Post -op Vital signs reviewed and stable  Post vital signs: Reviewed and stable  Last Vitals:  Vitals:   11/18/16 1718 11/18/16 1930  BP: 133/60 (!) 100/50  Pulse: 83 (!) 115  Resp: 12 15  Temp:  36.6 C  SpO2: 94% 93%    Last Pain:  Vitals:   11/18/16 1803  TempSrc:   PainSc: 4          Complications: No apparent anesthesia complications

## 2016-11-18 NOTE — Anesthesia Preprocedure Evaluation (Addendum)
Anesthesia Evaluation  Patient identified by MRN, date of birth, ID band Patient awake    Reviewed: Allergy & Precautions, NPO status , Patient's Chart, lab work & pertinent test results, reviewed documented beta blocker date and time   Airway Mallampati: III  TM Distance: >3 FB Neck ROM: Full    Dental  (+) Dental Advisory Given, Teeth Intact   Pulmonary former smoker,    Pulmonary exam normal breath sounds clear to auscultation       Cardiovascular hypertension, + CAD, + CABG (2001 5v) and + Peripheral Vascular Disease  Normal cardiovascular exam+ dysrhythmias Atrial Fibrillation  Rhythm:Regular Rate:Normal  Cath 07/2016 - Ost Cx to Prox Cx lesion, 90 %stenosed. Mid Cx lesion, 90 %stenosed. Ost 1st Mrg to 1st Mrg lesion, 90 %stenosed. Patent jump graft SVG to OM1 and O2 Mid LAD lesion, 75 %stenosed. LIMA to LAD is widely patent. Ost 1st Diag lesion, 100 %stenosed. Ost 2nd Diag lesion, 75 %stenosed. Patent jump graft SVG to first and second diagonals. Mild lesion in the proximal graft. LV end diastolic pressure is mildly elevated. The left ventricular ejection fraction is 50-55% by visual estimate. There is no aortic valve stenosis.   Neuro/Psych negative neurological ROS  negative psych ROS   GI/Hepatic negative GI ROS, Neg liver ROS,   Endo/Other  negative endocrine ROS  Renal/GU negative Renal ROS  negative genitourinary   Musculoskeletal negative musculoskeletal ROS (+)   Abdominal   Peds  Hematology  (+) anemia ,   Anesthesia Other Findings   Reproductive/Obstetrics                            Anesthesia Physical Anesthesia Plan  ASA: III  Anesthesia Plan: General   Post-op Pain Management:    Induction: Intravenous  PONV Risk Score and Plan: 2 and Treatment may vary due to age or medical condition, Ondansetron and Propofol infusion  Airway Management Planned:  LMA  Additional Equipment: None  Intra-op Plan:   Post-operative Plan: Extubation in OR  Informed Consent: I have reviewed the patients History and Physical, chart, labs and discussed the procedure including the risks, benefits and alternatives for the proposed anesthesia with the patient or authorized representative who has indicated his/her understanding and acceptance.   Dental advisory given  Plan Discussed with: CRNA  Anesthesia Plan Comments: (Minimize volatile agent exposure with propofol infusion)       Anesthesia Quick Evaluation

## 2016-11-18 NOTE — Anesthesia Postprocedure Evaluation (Signed)
Anesthesia Post Note  Patient: Luke [name]  Procedure(s) Performed: IRRIGATION AND DEBRIDEMENT RIGHT LONG AND RING FINGER (Right Finger)     Patient location during evaluation: PACU Anesthesia Type: General Level of consciousness: awake and alert Pain management: pain level controlled Vital Signs Assessment: post-procedure vital signs reviewed and stable Respiratory status: spontaneous breathing, nonlabored ventilation, respiratory function stable and patient connected to nasal cannula oxygen Cardiovascular status: blood pressure returned to baseline and stable Postop Assessment: no apparent nausea or vomiting Anesthetic complications: no    Last Vitals:  Vitals:   11/18/16 2000 11/18/16 2008  BP: 127/69   Pulse: 61   Resp: (!) 21   Temp:  37.4 C  SpO2: 97%     Last Pain:  Vitals:   11/18/16 2008  TempSrc:   PainSc: Sharpes Shabazz Mckey

## 2016-11-18 NOTE — ED Triage Notes (Signed)
Pt had laceration repaired here on Friday to R middle and ring fingers. Reports redness and swelling to R hand with pain. Hand is noted to be very swollen and hot to touch.

## 2016-11-18 NOTE — ED Provider Notes (Addendum)
[facility] EMERGENCY DEPARTMENT Provider Note   CSN: [id number] Arrival date & time: 11/18/16  1126     History   Chief Complaint Chief Complaint  Patient presents with  . Hand swelling    HPI Luke [name] is a 83 y.o. male.  HPI  The patient is a 83 year old male, currently on a daily baby aspirin, history of atrial fibrillation, DVT, hypertension and coronary disease with a CABG.  He presents to the hospital today complaining of increasing pain in his right ring finger after having a repair with sutures a couple of days ago.  The patient reports that he had been pushing a garbage can, it was full of "smelly garbage and liquid", he fell in the garbage can fell onto his finger with all of the dirty water from the garbage can infiltrating the wound.  He neglected to give this information to the provider on his initial visit.  When he was in the emergency department imaging showed a possible evulsion fracture but no other significant injuries, the repair of the wound was difficult given its macerated appearance, it was irrigated copiously per the report and the patient was placed on prophylactic antibiotics.  Despite best intentions the patient has had increasing pain redness and swelling which is now extending up the finger and onto the hand towards the wrist.  It involves both the dorsal and palmar aspect of the hand, his hand is swollen, his finger is in significantly more pain and there is purulent material coming from the wound.  The patient denies fevers chills nausea or vomiting.  The symptoms are gradually worsening.  He reports compliance with his cephalexin.  Past Medical History:  Diagnosis Date  . Atrial fibrillation ([place])   . CAD (coronary artery disease)     5 with the left internal mamary to the left anterior  descending coronary artery.  [name] Kitchen DVT femoral (deep venous thrombosis) with thrombophlebitis ([place])   . Dyslipidemia   . HTN (hypertension)     Patient  Active Problem List   Diagnosis Date Noted  . Chest pain 07/18/2016  . Abnormal EKG   . Hx of CABG   . Coronary artery disease involving native coronary artery of native heart with unstable angina pectoris ([place])   . Unstable angina ([place])   . Dyslipidemia, goal LDL below 70 04/16/2008  . Essential hypertension 04/16/2008  . CAD, ARTERY BYPASS GRAFT 04/16/2008  . DEEP VENOUS THROMBOPHLEBITIS, HX OF 04/16/2008  . ATRIAL FIBRILLATION, HX OF 04/16/2008    Past Surgical History:  Procedure Laterality Date  . CORONARY ARTERY BYPASS GRAFT  2001       Home Medications    Prior to Admission medications   Medication Sig Start Date End Date Taking? Authorizing Provider  amLODipine (NORVASC) 10 MG tablet Take 1 tablet (10 mg total) by mouth daily. 10/22/16   Sherren Mocha, MD  Artificial Tear Ointment (DRY EYES OP) Place 1 drop into both eyes daily as needed (dry eyes).     [provider]  aspirin 81 MG tablet Take 81 mg by mouth daily.      [provider]  atorvastatin (LIPITOR) 10 MG tablet Take one 10 mg tablet by mouth on Monday,Wednesday and Friday. 10/22/16   Sherren Mocha, MD  benazepril (LOTENSIN) 40 MG tablet Take 1 tablet (40 mg total) by mouth daily. 10/22/16   Sherren Mocha, MD  cephALEXin (KEFLEX) 500 MG capsule Take 1 capsule (500 mg total) 4 (four) times daily  by mouth. 11/16/16   Davonna Belling, MD  Coenzyme Q10 200 MG TABS Take 1 tablet by mouth daily.    [provider]  ezetimibe (ZETIA) 10 MG tablet Take 1 tablet (10 mg total) by mouth daily. 10/22/16   Sherren Mocha, MD  glucosamine-chondroitin 500-400 MG tablet Take 1 tablet by mouth daily.     [provider]  metoprolol tartrate (LOPRESSOR) 25 MG tablet Take 1 tablet (25 mg total) by mouth 2 (two) times daily. 10/22/16   Sherren Mocha, MD  Multiple Vitamins-Minerals (CENTRUM SILVER PO) Take 1 tablet by mouth daily.     [provider]  OMEGA-3 FATTY ACIDS PO  Take 1,200 mg by mouth 2 (two) times daily.    [provider]    Family History Family History  Problem Relation Age of Onset  . Heart disease Mother   . Cancer Mother   . Heart disease Father   . Heart attack Father   . Coronary artery disease Unknown     Social History Social History   Tobacco Use  . Smoking status: Former Research scientist (life sciences)  . Smokeless tobacco: Never Used  . Tobacco comment: stopped in the 1950's  Substance Use Topics  . Alcohol use: Yes    Alcohol/week: 0.0 oz    Comment: occ  . Drug use: No     Allergies   Statins   Review of Systems Review of Systems  All other systems reviewed and are negative.    Physical Exam Updated Vital Signs BP 120/70 (BP Location: Left Arm)   Pulse 62   Temp 98.3 F (36.8 C) (Oral)   Resp 18   Wt 90.7 kg (200 lb)   SpO2 96%   BMI 29.53 kg/m   Physical Exam  Constitutional: He appears well-developed and well-nourished. No distress.  HENT:  Head: Normocephalic and atraumatic.  Mouth/Throat: Oropharynx is clear and moist. No oropharyngeal exudate.  Eyes: Conjunctivae and EOM are normal. Pupils are equal, round, and reactive to light. Right eye exhibits no discharge. Left eye exhibits no discharge. No scleral icterus.  Neck: Normal range of motion. Neck supple. No JVD present. No thyromegaly present.  Cardiovascular: Normal rate, regular rhythm, normal heart sounds and intact distal pulses. Exam reveals no gallop and no friction rub.  No murmur heard. Pulmonary/Chest: Effort normal and breath sounds normal. No respiratory distress. He has no wheezes. He has no rales.  Abdominal: Soft. Bowel sounds are normal. He exhibits no distension and no mass. There is no tenderness.  Musculoskeletal: He exhibits tenderness. He exhibits no edema.  All 4 extremities are normal except for the right upper extremity where there is significant increased pain and swelling of the right fourth finger, the repaired wound appears  intact however there is significant swelling redness and purulent material coming from the wound.  The erythema spreads dorsally towards the wrist as well as on the palmar surface on the distal palm.  There is swelling of the right hand compared to the left hand.  There is tenderness to palpation over this.  Lymphadenopathy:    He has no cervical adenopathy.  Neurological: He is alert. Coordination normal.  Skin: Skin is warm and dry. No rash noted. No erythema.  As above per the right hand  Psychiatric: He has a normal mood and affect. His behavior is normal.  Nursing note and vitals reviewed.    ED Treatments / Results  Labs (all labs ordered are listed, but only abnormal results are displayed)  Labs Reviewed  CBC WITH DIFFERENTIAL/PLATELET - Abnormal; Notable for the following components:      Result Value   WBC 12.8 (*)    RBC 3.85 (*)    Hemoglobin 12.6 (*)    HCT 37.7 (*)    Neutro Abs 10.5 (*)    Monocytes Absolute 1.2 (*)    All other components within normal limits  BASIC METABOLIC PANEL - Abnormal; Notable for the following components:   Glucose, Bld 111 (*)    Creatinine, Ser 1.41 (*)    GFR calc non Af Amer 44 (*)    GFR calc Af Amer 51 (*)    All other components within normal limits     Radiology Dg Hand Complete Right  Result Date: 11/18/2016 CLINICAL DATA:  Swelling and abrasions of the right hand post trauma. EXAM: RIGHT HAND - COMPLETE 3+ VIEW COMPARISON:  11/16/2016 FINDINGS: Avulsion fracture at the fourth DIP joint re- demonstrated. Soft tissue swelling of the right fourth finger. No other acute osseous abnormality seen. IMPRESSION: Re- demonstration of avulsion fracture at the fourth right DIP joint. No other acute osseus abnormality seen. Electronically Signed   By: Fidela Salisbury M.D.   On: 11/18/2016 12:36   Dg Finger Ring Right  Result Date: 11/16/2016 CLINICAL DATA:  Fall.  Laceration EXAM: RIGHT RING FINGER 2+V COMPARISON:  None. FINDINGS: Soft  tissue laceration. Small bone fragment adjacent to the DIP joint medially. This could be acute or chronic avulsion fracture. Joint space normal. IMPRESSION: Soft tissue laceration Small avulsion fracture the DIP joint which could be acute or chronic. Electronically Signed   By: Franchot Gallo M.D.   On: 11/16/2016 16:54    Procedures Procedures (including critical care time)  Medications Ordered in ED Medications  clindamycin (CLEOCIN) IVPB 600 mg (not administered)  fentaNYL (SUBLIMAZE) injection 25 mcg (25 mcg Intravenous Given 11/18/16 1236)     Initial Impression / Assessment and Plan / ED Course  I have reviewed the triage vital signs and the nursing notes.  Pertinent labs & imaging results that were available during my care of the patient were reviewed by me and considered in my medical decision making (see chart for details).  Clinical Course as of Nov 20 654  Sun Nov 18, 2016  1701 Nursing did report an episode of bradycardia into the 30s but returned back to normal. Patient placed on telemetry.  [AL]    Clinical Course User Index [AL] Frederica Kuster, PA-C    At this time the patient does appear to have a post laceration repair infection, it appears significant with purulent material and spreading redness going up the hand.  I will discussed this with hand surgeon Dr. Veverly Fells on call.  Dr. Alma Friendly has accepted the patient in transfer to [facility], he believes that the patient will need to go to the operating room for a cleanout.  He has requested antibiotics including clindamycin which have been ordered.  The patient's labs do show a leukocytosis.    Final Clinical Impressions(s) / ED Diagnoses   Final diagnoses:  Infected abrasion of right hand, initial encounter    ED Discharge Orders    None       Noemi Chapel, MD 11/18/16 [place], MD 11/19/16 [id number]

## 2016-11-18 NOTE — ED Notes (Signed)
Pt brady down to the 30 for 7 seconds. Due to Pt extensive heart Hx Pt placed on 5 lead.

## 2016-11-18 NOTE — Op Note (Signed)
Luke, [name] NO.:  [id number]  MEDICAL RECORD NO.:  [id number]  LOCATION:                               FACILITY:  [facility]  PHYSICIAN:  Doran Heater. Veverly Fells, M.D. DATE OF BIRTH:  06-25-1930  DATE OF PROCEDURE:  11/18/2016 DATE OF DISCHARGE:                              OPERATIVE REPORT   PREOPERATIVE DIAGNOSIS:  Infected right long and ring fingers.  POSTOPERATIVE DIAGNOSIS:  Infected right long and ring fingers.  PROCEDURE PERFORMED:  Incision and drainage, deep right index and ring finger to tendon.  ATTENDING SURGEON:  Doran Heater. Veverly Fells, MD.  ASSISTANT:  None.  ANESTHESIA:  General anesthesia was used.  ESTIMATED BLOOD LOSS:  Minimal.  FLUID REPLACEMENT:  500 mL of crystalloid.  INSTRUMENT COUNTS:  Correct.  COMPLICATIONS:  There were no complications.  Perioperative antibiotics were given.  INDICATIONS:  The patient is an 83 year old male who suffered a traumatic injury to his ring finger and long finger on his right hand several nights ago.  The patient was treated initially at the [facility] ED where he had an I and D and primary closure, but noticed increasing swelling and pain over the last several days and drainage today and fevers to the point where it was clear that he had a hand infection.  He presented back to [facility] who transported him to [facility] for definitive care.  Upon evaluation in the emergency department, the patient was noted to have gross purulence coming out of his laceration repair areas both on the long finger and the ring finger. The patient had expanding cellulitis and swelling to the dorsal hand. The patient presents now for operative I and D and an informed consent obtained.  DESCRIPTION OF PROCEDURE:  After an adequate level of anesthesia was achieved, the patient was positioned supine on the operating table. Right arm correctly identified and a nonsterile tourniquet was placed  on proximal arm.  Right hand was sterilely prepped and draped all the way to the elbow.  We did not wind up utilizing the tourniquet.  Time-out was called.  We then went ahead and after our time-out, removed all the sutures from the wound sharply using scissors and pickups.  We then went ahead and freshened up the skin edges, which were macerated and were devitalized.  We irrigated with 3 L of normal saline irrigation.  I used a curette and Freer elevator to clean off the underlying sagittal band. This was on the ulnar aspect of the ring finger dorsally.  There was a large flap that was 1 cm in its medial lateral width by about 3 cm in its length that had been sutured down with numerous Vicryl sutures.  All the sutures were out.  We freshened up the skin.  We cleaned up the wound and checked to make sure that the dorsal capsule over the PIP joint was intact, which it did appear to be.  There did not appear to be any joint involvement.  After a thorough irrigation and sharp debridement back to getting clean tissue, I did a very loose closure with nylon to make sure we had tendon coverage  over the sagittal band and at this point, placed a sterile bandage and the long finger was really more of a partial thickness skin loss over the middle phalanx. Did not have any joint involvement and appeared to be much less of an issue.  We just removed the sutures and the devitalized skin and so there was about a 1 cm x 0.75 cm area of devitalized skin that was removed and so this basically looked like a deep burn more than anything else, but subcu tissue was intact with actually some early granulation there.  So, with the wounds cleaned up, we dressed them with a sterile dressing and a bulky dressing and then placed the patient in a foam to elevate the arm and transported to the recovery room in stable condition.  We did get some deep cultures from the ring finger and sent those for Gram stain, aerobic  and anaerobic.     Doran Heater. Veverly Fells, M.D.   ______________________________ Doran Heater. Veverly Fells, M.D.    SRN/MEDQ  D:  11/18/2016  T:  11/18/2016  Job:  [id number]

## 2016-11-18 NOTE — ED Notes (Signed)
Pt is undress

## 2016-11-18 NOTE — ED Provider Notes (Signed)
[facility] EMERGENCY DEPARTMENT Provider Note   CSN: [id number] Arrival date & time: 11/18/16  1126     History   Chief Complaint Chief Complaint  Patient presents with  . Hand swelling    HPI Luke [name] is a 83 y.o. male with history of A. fib, CAD retention who presents as a transfer from [place] with a right hand infection.  Patient had a fall 2 days ago and had a laceration repair to his third and fourth digits.  He has had progressive swelling, pain, and drainage since the repair, despite compliance with Keflex.  See previous providers notes for further details of the injury.  Dr. Sabra Heck spoke with Dr. Veverly Fells prior to transfer who would like to take the patient to the OR.  A dose of clindamycin was given at med center.  Patient's daughter says that her his temperature has been up and down over the past 24 hours.  No fever today.  Patient denies any other symptoms.  Denies any chest pain, shortness of breath, abdominal pain, nausea, vomiting.  HPI  Past Medical History:  Diagnosis Date  . Atrial fibrillation ([place])   . CAD (coronary artery disease)     5 with the left internal mamary to the left anterior  descending coronary artery.  [name] Kitchen DVT femoral (deep venous thrombosis) with thrombophlebitis ([place])   . Dyslipidemia   . HTN (hypertension)     Patient Active Problem List   Diagnosis Date Noted  . Chest pain 07/18/2016  . Abnormal EKG   . Hx of CABG   . Coronary artery disease involving native coronary artery of native heart with unstable angina pectoris ([place])   . Unstable angina ([place])   . Dyslipidemia, goal LDL below 70 04/16/2008  . Essential hypertension 04/16/2008  . CAD, ARTERY BYPASS GRAFT 04/16/2008  . DEEP VENOUS THROMBOPHLEBITIS, HX OF 04/16/2008  . ATRIAL FIBRILLATION, HX OF 04/16/2008    Past Surgical History:  Procedure Laterality Date  . CORONARY ARTERY BYPASS GRAFT  2001       Home Medications    Prior to  Admission medications   Medication Sig Start Date End Date Taking? Authorizing Provider  amLODipine (NORVASC) 10 MG tablet Take 1 tablet (10 mg total) by mouth daily. 10/22/16   Sherren Mocha, MD  Artificial Tear Ointment (DRY EYES OP) Place 1 drop into both eyes daily as needed (dry eyes).     [provider]  aspirin 81 MG tablet Take 81 mg by mouth daily.      [provider]  atorvastatin (LIPITOR) 10 MG tablet Take one 10 mg tablet by mouth on Monday,Wednesday and Friday. 10/22/16   Sherren Mocha, MD  benazepril (LOTENSIN) 40 MG tablet Take 1 tablet (40 mg total) by mouth daily. 10/22/16   Sherren Mocha, MD  cephALEXin (KEFLEX) 500 MG capsule Take 1 capsule (500 mg total) 4 (four) times daily by mouth. 11/16/16   Davonna Belling, MD  Coenzyme Q10 200 MG TABS Take 1 tablet by mouth daily.    [provider]  ezetimibe (ZETIA) 10 MG tablet Take 1 tablet (10 mg total) by mouth daily. 10/22/16   Sherren Mocha, MD  glucosamine-chondroitin 500-400 MG tablet Take 1 tablet by mouth daily.     [provider]  metoprolol tartrate (LOPRESSOR) 25 MG tablet Take 1 tablet (25 mg total) by mouth 2 (two) times daily. 10/22/16   Sherren Mocha, MD  Multiple Vitamins-Minerals (CENTRUM SILVER PO)  Take 1 tablet by mouth daily.     [provider]  OMEGA-3 FATTY ACIDS PO Take 1,200 mg by mouth 2 (two) times daily.    [provider]    Family History Family History  Problem Relation Age of Onset  . Heart disease Mother   . Cancer Mother   . Heart disease Father   . Heart attack Father   . Coronary artery disease Unknown     Social History Social History   Tobacco Use  . Smoking status: Former Research scientist (life sciences)  . Smokeless tobacco: Never Used  . Tobacco comment: stopped in the 1950's  Substance Use Topics  . Alcohol use: Yes    Alcohol/week: 0.0 oz    Comment: occ  . Drug use: No     Allergies   Statins   Review of Systems Review of  Systems  Constitutional: Positive for fever. Negative for chills.  HENT: Negative for facial swelling and sore throat.   Respiratory: Negative for shortness of breath.   Cardiovascular: Negative for chest pain.  Gastrointestinal: Negative for abdominal pain, nausea and vomiting.  Genitourinary: Negative for dysuria.  Musculoskeletal: Positive for arthralgias and joint swelling. Negative for back pain.  Skin: Positive for wound. Negative for rash.  Neurological: Negative for headaches.  Psychiatric/Behavioral: The patient is not nervous/anxious.      Physical Exam Updated Vital Signs BP 114/64 (BP Location: Left Arm)   Pulse 61   Temp (!) 100.8 F (38.2 C) (Oral)   Resp 17   Wt 90.7 kg (200 lb)   SpO2 96%   BMI 29.53 kg/m   Physical Exam  Constitutional: He appears well-developed and well-nourished. No distress.  HENT:  Head: Normocephalic and atraumatic.  Mouth/Throat: Oropharynx is clear and moist. No oropharyngeal exudate.  Eyes: Conjunctivae are normal. Pupils are equal, round, and reactive to light. Right eye exhibits no discharge. Left eye exhibits no discharge. No scleral icterus.  Neck: Normal range of motion. Neck supple. No thyromegaly present.  Cardiovascular: Normal rate, regular rhythm, normal heart sounds and intact distal pulses. Exam reveals no gallop and no friction rub.  No murmur heard. Pulmonary/Chest: Effort normal and breath sounds normal. No stridor. No respiratory distress. He has no wheezes. He has no rales.  Abdominal: Soft. Bowel sounds are normal. He exhibits no distension. There is no tenderness. There is no rebound and no guarding.  Musculoskeletal: He exhibits no edema.  Repaired lacerations to the third and fourth digits with drainage and swelling, erythema, and warmth extending back into the hand and wrist.  See photo. Range of motion of digits not assessed due to swelling and sutures, to prevent from sutures pulling out. Radial pulses intact  bilaterally.  Lymphadenopathy:    He has no cervical adenopathy.  Neurological: He is alert. Coordination normal.  Skin: Skin is warm and dry. No rash noted. He is not diaphoretic. No pallor.  Psychiatric: He has a normal mood and affect.  Nursing note and vitals reviewed.      ED Treatments / Results  Labs (all labs ordered are listed, but only abnormal results are displayed) Labs Reviewed  CBC WITH DIFFERENTIAL/PLATELET - Abnormal; Notable for the following components:      Result Value   WBC 12.8 (*)    RBC 3.85 (*)    Hemoglobin 12.6 (*)    HCT 37.7 (*)    Neutro Abs 10.5 (*)    Monocytes Absolute 1.2 (*)    All other components within normal  limits  BASIC METABOLIC PANEL - Abnormal; Notable for the following components:   Glucose, Bld 111 (*)    Creatinine, Ser 1.41 (*)    GFR calc non Af Amer 44 (*)    GFR calc Af Amer 51 (*)    All other components within normal limits  TYPE AND SCREEN    EKG  EKG Interpretation None       Radiology Dg Hand Complete Right  Result Date: 11/18/2016 CLINICAL DATA:  Swelling and abrasions of the right hand post trauma. EXAM: RIGHT HAND - COMPLETE 3+ VIEW COMPARISON:  11/16/2016 FINDINGS: Avulsion fracture at the fourth DIP joint re- demonstrated. Soft tissue swelling of the right fourth finger. No other acute osseous abnormality seen. IMPRESSION: Re- demonstration of avulsion fracture at the fourth right DIP joint. No other acute osseus abnormality seen. Electronically Signed   By: Fidela Salisbury M.D.   On: 11/18/2016 12:36    Procedures Procedures (including critical care time)  Medications Ordered in ED Medications  0.9 %  sodium chloride infusion (not administered)  acetaminophen (TYLENOL) suppository 650 mg (not administered)  fentaNYL (SUBLIMAZE) injection 25 mcg (25 mcg Intravenous Given 11/18/16 1236)  clindamycin (CLEOCIN) IVPB 600 mg (0 mg Intravenous Stopped 11/18/16 1514)     Initial Impression /  Assessment and Plan / ED Course  I have reviewed the triage vital signs and the nursing notes.  Pertinent labs & imaging results that were available during my care of the patient were reviewed by me and considered in my medical decision making (see chart for details).  Clinical Course as of Nov 18 1699  Sun Nov 18, 2016  1701 Nursing did report an episode of bradycardia into the 30s but returned back to normal. Patient placed on telemetry.  [AL]    Clinical Course User Index [AL] Frederica Kuster, PA-C    Labs completed at [facility] showing elevated white blood cell count, 12.8.  Creatinine 1.41, which is also elevated for the patient.  X-ray shows avulsion fracture of the right fourth DIP joint.  IV dose of clindamycin 600mg  given PTA at [facility]. Fluids initiated at 100 mL/h.  Type and screen, EKG, and CXR pending. Patient declined any pain medication at this time. Patient became febrile with 100.8 fever in the ED. Given rectal Tylenol to keep NPO status. Last PO at 8am this morning. I consulted Dr. Veverly Fells who will evaluate the patient in the ED and take the patient to the OR.   Final Clinical Impressions(s) / ED Diagnoses   Final diagnoses:  Infected abrasion of right hand, initial encounter    ED Discharge Orders    None       Frederica Kuster, PA-C 11/18/16 1703    Margette Fast, MD 11/18/16 2120

## 2016-11-18 NOTE — Anesthesia Procedure Notes (Signed)
Procedure Name: LMA Insertion Date/Time: 11/18/2016 6:39 PM Performed by: Babs Bertin, CRNA Pre-anesthesia Checklist: Patient identified, Emergency Drugs available, Suction available and Patient being monitored Patient Re-evaluated:Patient Re-evaluated prior to induction Oxygen Delivery Method: Circle System Utilized Preoxygenation: Pre-oxygenation with 100% oxygen Induction Type: IV induction Ventilation: Mask ventilation without difficulty LMA: LMA inserted LMA Size: 4.0 Number of attempts: 1 Airway Equipment and Method: Bite block Placement Confirmation: positive ETCO2 Tube secured with: Tape Dental Injury: Teeth and Oropharynx as per pre-operative assessment

## 2016-11-18 NOTE — ED Notes (Signed)
Pt ambulatory to car with his daughter. Verbalized understanding to go directly to [facility] ED and not to eat or drink anything. PIV secured with kling for transport and finger wound covered with gauze

## 2016-11-18 NOTE — H&P (Signed)
ORTHO ADMIT HISTORY AND PHYSICAL     Subjective: Right hand pain after traumatic injury.  Patient presented to [place] several days ago with an injury to his right hand. He had a laceration that was washed out and then closed.  Placed on po abx.  Patient represented today with fevers and increased pain and drainage from the hand. Transported to [facility] for further eval suspecting established infection.   Objective: Vital signs in last 24 hours: Temp:  [98.3 F (36.8 C)-100.8 F (38.2 C)] 100.8 F (38.2 C) (11/11 1632) Pulse Rate:  [40-64] 61 (11/11 1632) Resp:  [17-20] 17 (11/11 1632) BP: (114-142)/(53-70) 114/64 (11/11 1632) SpO2:  [96 %-97 %] 96 % (11/11 1632) Weight:  [90.7 kg (200 lb)] 90.7 kg (200 lb) (11/11 1133)  Intake/Output from previous day: No intake/output data recorded. Intake/Output this shift: Total I/O In: 50 [IV Piggyback:50] Out: -   Recent Labs    11/18/16 1219  HGB 12.6*   Recent Labs    11/18/16 1219  WBC 12.8*  RBC 3.85*  HCT 37.7*  PLT 154   Recent Labs    11/18/16 1219  NA 136  K 4.0  CL 102  CO2 28  BUN 20  CREATININE 1.41*  GLUCOSE 111*  CALCIUM 9.0   No results for input(s): LABPT, INR in the last 72 hours.  EXAM: Right hand examined with obvious infection involving the dorsal ring and long fingers in the site of the traumatic laceration.  Sutures present from repair.  Moderate swelling. Compartments swollen but not rigid. Diminished ROM due to pain.  Swelling noted to the MCP joints  Assessment/Plan: Right hand infection with cellulitis and abscess.  Recommend I+D ASAP Due to cardiac history will get a EKG and CXR. Tetanus update and IV abx (Clinda given at [facility])   Doreather Hoxworth,STEVEN R 11/18/2016, 5:15 PM

## 2016-11-18 NOTE — Op Note (Deleted)
  The note originally documented on this encounter has been moved the the encounter in which it belongs.  

## 2016-11-19 ENCOUNTER — Encounter (HOSPITAL_COMMUNITY): Payer: Self-pay | Admitting: Orthopedic Surgery

## 2016-11-19 NOTE — Progress Notes (Signed)
   Subjective: 1 Day Post-Op Procedure(s) (LRB): IRRIGATION AND DEBRIDEMENT RIGHT LONG AND RING FINGER (Right)  Pt feeling much better Hand has less pain and currently pain is mild C/o a 'pop' in his left shoulder and has limited rom of left upper extremity Denies any numbness or tingling distally Patient reports pain as mild.  Objective:   VITALS:   Vitals:   11/18/16 2300 11/19/16 0559  BP: (!) 131/56 (!) 119/51  Pulse: 73 67  Resp: 16 15  Temp: 98.9 F (37.2 C) 98.6 F (37 C)  SpO2: 95% 96%    Right 3rd finger incision healing well Reduced erythema and edema from report of the pt but erythema persists nv intact distally Dressing changed  LABS Recent Labs    11/18/16 1219  HGB 12.6*  HCT 37.7*  WBC 12.8*  PLT 154    Recent Labs    11/18/16 1219  NA 136  K 4.0  BUN 20  CREATININE 1.41*  GLUCOSE 111*     Assessment/Plan: 1 Day Post-Op Procedure(s) (LRB): IRRIGATION AND DEBRIDEMENT RIGHT LONG AND RING FINGER (Right) Continue IV antibiotics  Plan to d/c tomorrow with oral antibiotics May treat left shoulder with cortisone injection to aid with pain Pain management as needed    Merla Riches, MPAS, PA-C  11/19/2016, 10:35 AM

## 2016-11-20 ENCOUNTER — Encounter (HOSPITAL_COMMUNITY): Payer: Self-pay | Admitting: General Practice

## 2016-11-20 IMAGING — US US EXTREM LOW VENOUS*R*
1 series · 13 of 24 positions shown · non-contrast
Comparison: No priors.

CLINICAL DATA: 84-year-old male complaining of right-sided foot
pain and swelling. Redness and warmth to the touch for the past 3
days. History of prior deep venous thrombosis in 0339.



[Series 1: us extrem low venous*right* · 0.08mm/px · 13 of 29 slices shown]
[im 1/29]
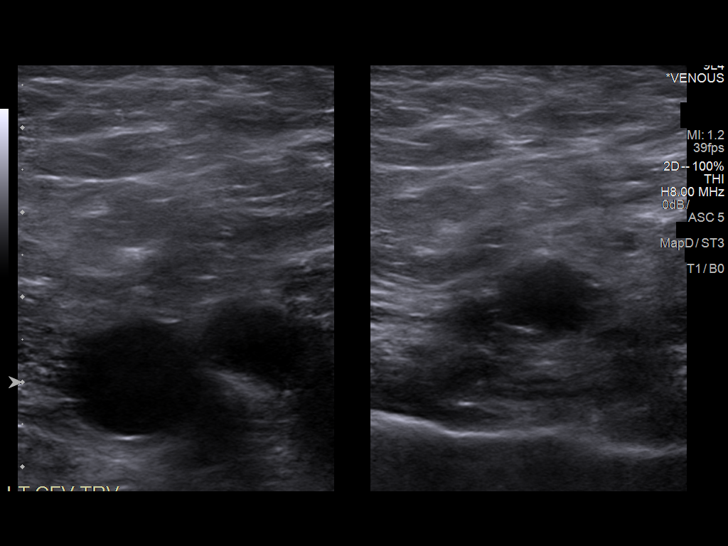
[im 3/29]
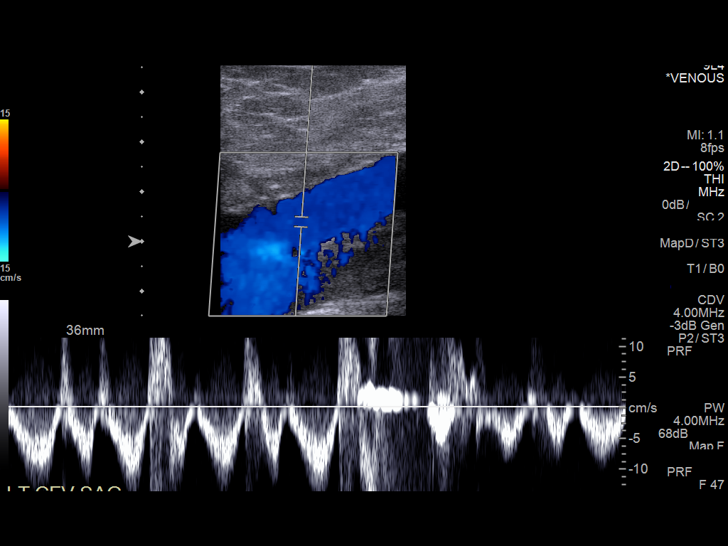
[im 5/29]
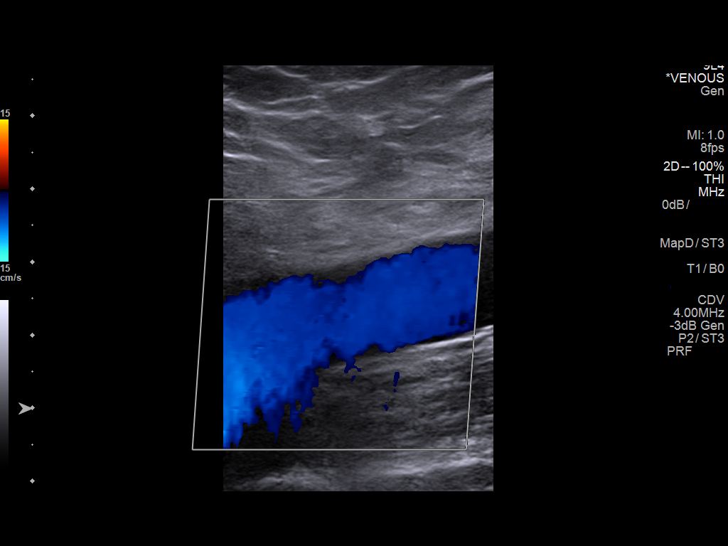
[im 8/29]
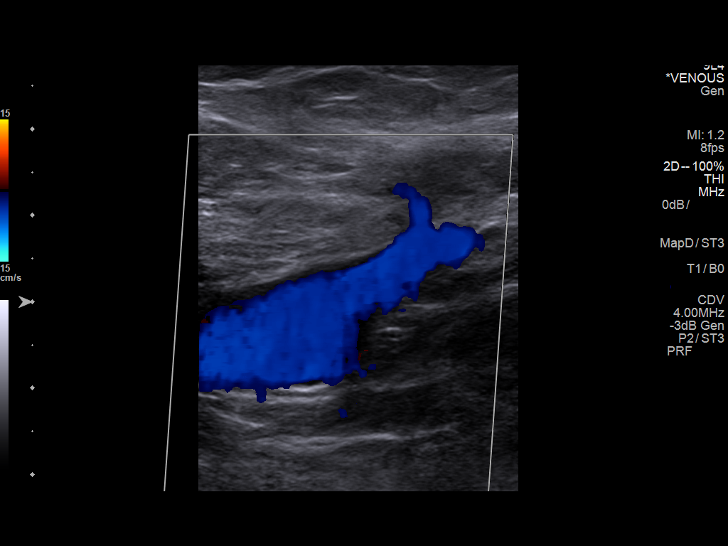
[im 10/29]
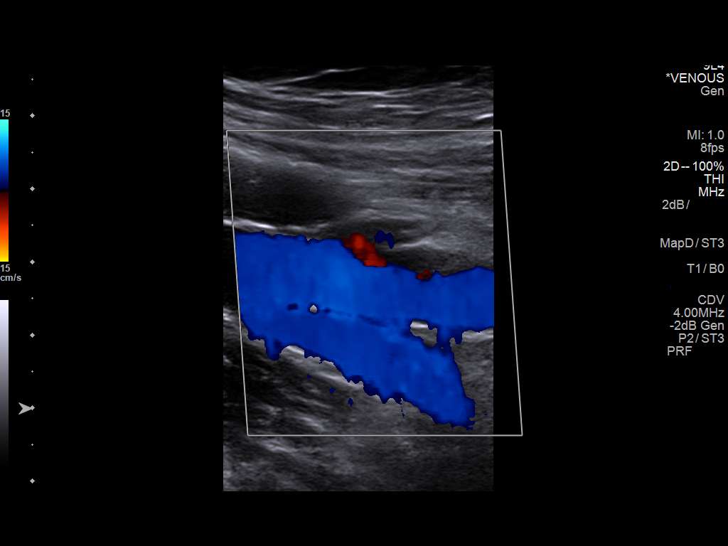
[im 13/29]
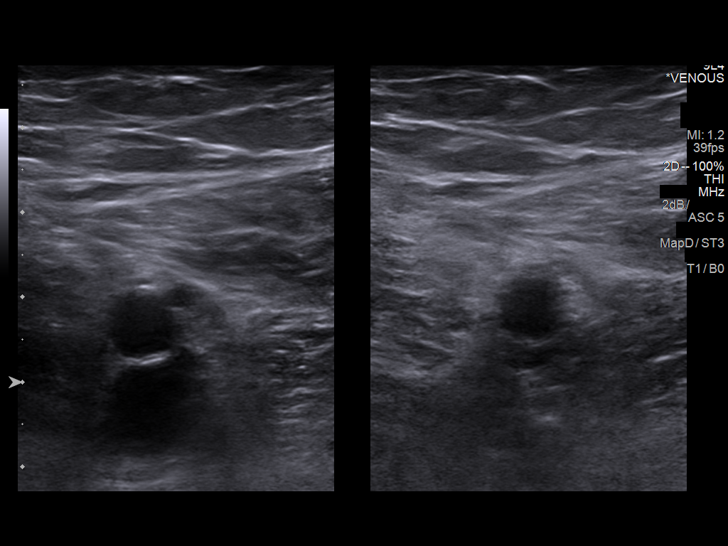
[im 15/29]
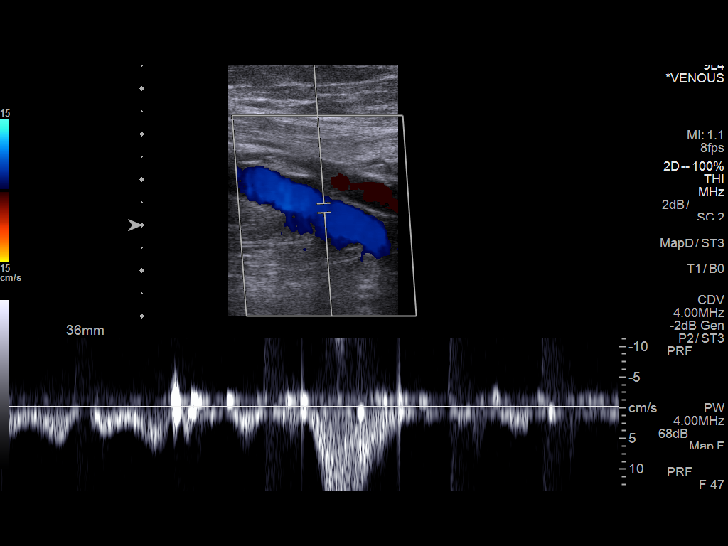
[im 16/29]
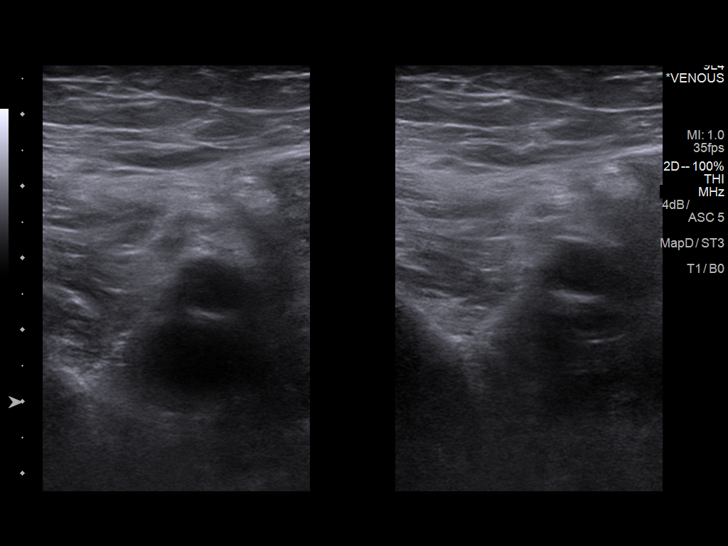
[im 19/29]
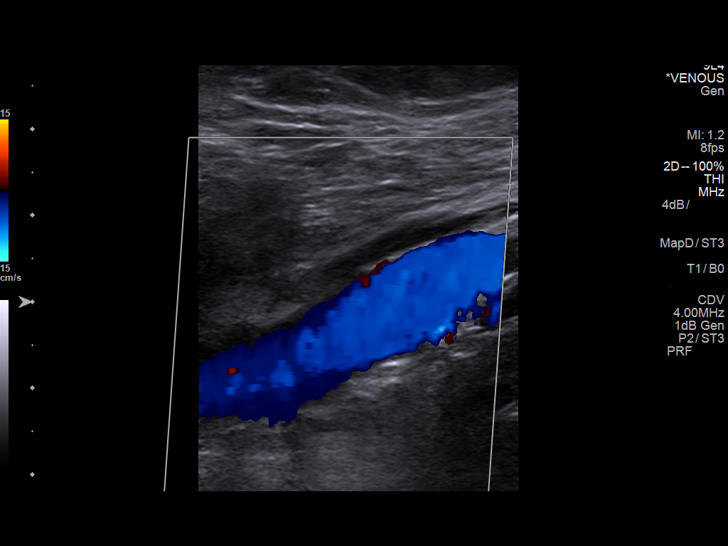
[im 21/29]
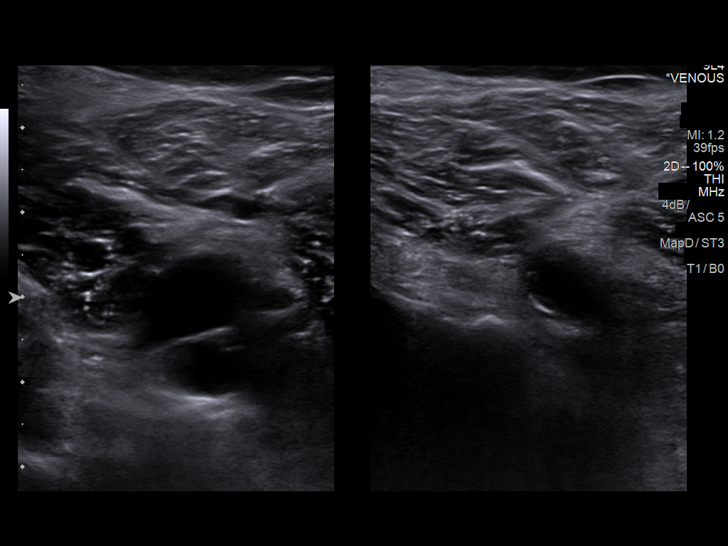
[im 24/29]
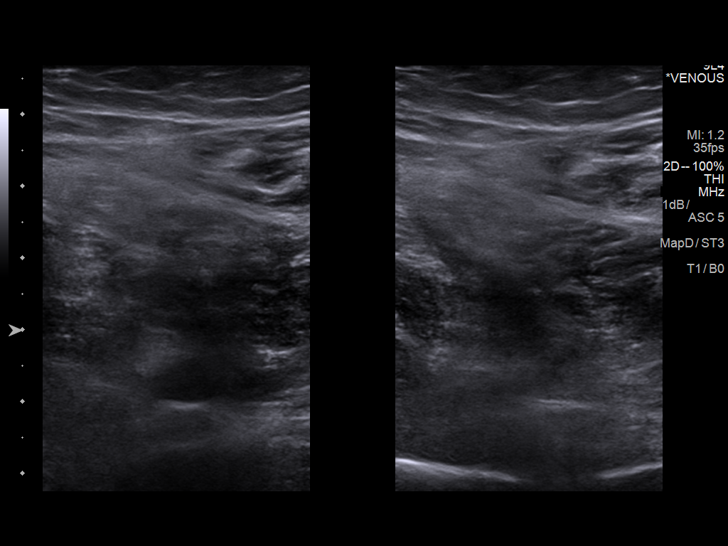
[im 26/29]
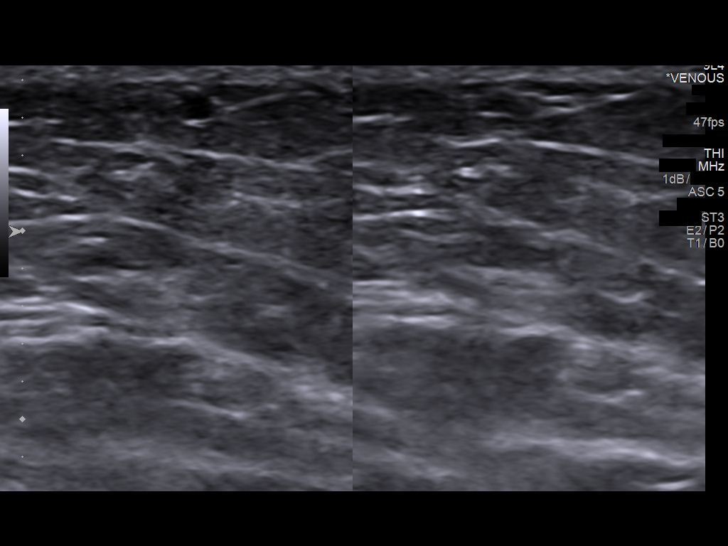
[im 29/29]
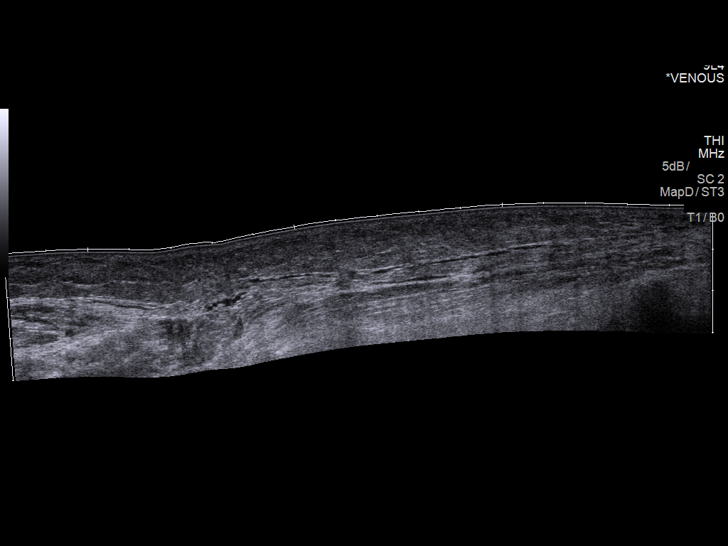

[13 of 24 positions shown; findings below may reference images not displayed]

FINDINGS: Contralateral Common Femoral Vein: Respiratory phasicity is normal
and symmetric with the symptomatic side. No evidence of thrombus.
Normal compressibility.

Common Femoral Vein: No evidence of thrombus. Normal
compressibility, respiratory phasicity and response to augmentation.

Saphenofemoral Junction: No evidence of thrombus. Normal
compressibility and flow on color Doppler imaging.

Profunda Femoral Vein: No evidence of thrombus. Normal
compressibility and flow on color Doppler imaging.

Femoral Vein: No evidence of thrombus. Normal compressibility,
respiratory phasicity and response to augmentation.

Popliteal Vein: No evidence of thrombus. Normal compressibility,
respiratory phasicity and response to augmentation.

Calf Veins: No evidence of thrombus. Normal compressibility and flow
on color Doppler imaging.

Superficial Great Saphenous Vein: No evidence of thrombus. Normal
compressibility and flow on color Doppler imaging.

Venous Reflux:  None.

Other Findings:  None.
IMPRESSION: No evidence of right lower extremity deep venous thrombosis.

## 2016-11-20 MED ORDER — OXYCODONE HCL 5 MG PO TABS: 5.0000 mg | ORAL_TABLET | ORAL | 0 refills | Status: DC | PRN

## 2016-11-20 NOTE — Progress Notes (Signed)
Orthopedics Progress Note  Subjective: Stable overnight. Pain is better today  Objective:  Vitals:   11/19/16 2051 11/20/16 0518  BP: (!) 120/52 132/60  Pulse: 72 70  Resp: 16 16  Temp: 100.3 F (37.9 C) 99.5 F (37.5 C)  SpO2: 92% 94%    General: Awake and alert  Musculoskeletal: erythema persists but swelling much better today.   Neurovascularly intact  Lab Results  Component Value Date   WBC 12.8 (H) 11/18/2016   HGB 12.6 (L) 11/18/2016   HCT 37.7 (L) 11/18/2016   MCV 97.9 11/18/2016   PLT 154 11/18/2016       Component Value Date/Time   NA 136 11/18/2016 1219   NA 143 05/04/2016 1005   K 4.0 11/18/2016 1219   CL 102 11/18/2016 1219   CO2 28 11/18/2016 1219   GLUCOSE 111 (H) 11/18/2016 1219   GLUCOSE 97 11/15/2005 0820   BUN 20 11/18/2016 1219   BUN 24 05/04/2016 1005   CREATININE 1.41 (H) 11/18/2016 1219   CREATININE 1.14 (H) 02/28/2015 1541   CALCIUM 9.0 11/18/2016 1219   GFRNONAA 44 (L) 11/18/2016 1219   GFRAA 51 (L) 11/18/2016 1219    Lab Results  Component Value Date   INR 1.05 07/19/2016    Assessment/Plan: POD #2 s/p Procedure(s): IRRIGATION AND DEBRIDEMENT RIGHT LONG AND RING FINGER Culture growing both gram positive and gram negative species.  Will increase spectrum of coverage] Discharge pending, possibly later today.  Doran Heater. Luke Fells, MD 11/20/2016 7:47 AM

## 2016-11-20 NOTE — Progress Notes (Signed)
Prentiss Bells, RN reviewed discharge instructions with patient and family. Pt and family verbalize understanding. Pt belongings with pt. Pt is not in distress. Pt discharged via wheelchair.

## 2016-11-20 NOTE — Discharge Summary (Signed)
Physician Discharge Summary    Patient ID: Luke [name] MRN: [id number] DOB/AGE: 05-14-1930 83 y.o.  Admit date: 11/18/2016 Discharge date:  11/20/16  Procedures:  Procedure(s) (LRB): IRRIGATION AND DEBRIDEMENT RIGHT LONG AND RING FINGER (Right)  Attending Physician:  Dr. Esmond Plants  Admission Diagnoses:   Right third and 4th finger infections  Consultants:  none  Discharge Diagnoses:  Active Problems:   Open wound of right hand with tendon involvement  Past Medical History:  Diagnosis Date  . Atrial fibrillation ([place])   . CAD (coronary artery disease)     5 with the left internal mamary to the left anterior  descending coronary artery.  [name] Kitchen DVT femoral (deep venous thrombosis) with thrombophlebitis ([place])   . Dyslipidemia   . HTN (hypertension)      PCP: Michel Harrow, PA-C   Discharged Condition: good  Hospital Course:  Patient underwent the above stated procedure on 11/18/2016. Patient tolerated the procedure well and brought to the recovery room in good condition and subsequently to the floor. No complications during their hospital stay. No complicating wound issues during the hospital stay. Incision healing well and good early range of motion  Disposition: 01-Home or Self Care with follow up in 2 weeks  Medications: Current Facility-Administered Medications  Medication Dose Route Frequency Provider Last Rate Last Dose  . 0.9 %  sodium chloride infusion   Intravenous Continuous Netta Cedars, MD 50 mL/hr at 11/19/16 0233 50 mL/hr at 11/19/16 0233  . acetaminophen (TYLENOL) tablet 650 mg  650 mg Oral Q4H PRN Netta Cedars, MD   650 mg at 11/19/16 1443   Or  . acetaminophen (TYLENOL) suppository 650 mg  650 mg Rectal Q4H PRN Netta Cedars, MD      . amLODipine (NORVASC) tablet 10 mg  10 mg Oral Daily Netta Cedars, MD   10 mg at 11/20/16 1696  . aspirin EC tablet 81 mg  81 mg Oral Daily Netta Cedars, MD   81 mg at 11/20/16 7893  . atorvastatin (LIPITOR)  tablet 10 mg  10 mg Oral Once per day on Mon Wed Fri Norris, Steve, MD   10 mg at 11/19/16 1131  . benazepril (LOTENSIN) tablet 40 mg  40 mg Oral Daily Netta Cedars, MD   40 mg at 11/20/16 0806  . cephALEXin (KEFLEX) capsule 500 mg  500 mg Oral TID AC & HS Netta Cedars, MD   500 mg at 11/20/16 1214  . docusate sodium (COLACE) capsule 100 mg  100 mg Oral BID Netta Cedars, MD   100 mg at 11/20/16 8101  . ezetimibe (ZETIA) tablet 10 mg  10 mg Oral Daily Netta Cedars, MD   10 mg at 11/20/16 0806  . metoCLOPramide (REGLAN) tablet 5-10 mg  5-10 mg Oral Q8H PRN Netta Cedars, MD       Or  . metoCLOPramide (REGLAN) injection 5-10 mg  5-10 mg Intravenous Q8H PRN Netta Cedars, MD      . metoprolol tartrate (LOPRESSOR) tablet 25 mg  25 mg Oral BID Netta Cedars, MD   25 mg at 11/20/16 7510  . morphine 4 MG/ML injection 2-3 mg  2-3 mg Intravenous Q2H PRN Netta Cedars, MD      . ondansetron [facility]) tablet 4 mg  4 mg Oral Q6H PRN Netta Cedars, MD       Or  . ondansetron [facility]) injection 4 mg  4 mg Intravenous Q6H PRN Netta Cedars, MD      . oxyCODONE (Oxy IR/ROXICODONE)  immediate release tablet 5 mg  5 mg Oral Q3H PRN Netta Cedars, MD   5 mg at 11/20/16 0302  . polyethylene glycol (MIRALAX / GLYCOLAX) packet 17 g  17 g Oral Daily PRN Netta Cedars, MD        Follow-up Information    Iran Planas, MD. Call in 3 days.   Specialty:  Orthopedic Surgery Why:  [phone] Contact information: [address] [place] [address] [phone]           Discharge Instructions    Call MD / Call 911   Complete by:  As directed    If you experience chest pain or shortness of breath, CALL 911 and be transported to the hospital emergency room.  If you develope a fever above 101 F, pus (white drainage) or increased drainage or redness at the wound, or calf pain, call your surgeon's office.   Constipation Prevention   Complete by:  As directed    Drink plenty of fluids.  Prune  juice may be helpful.  You may use a stool softener, such as Colace (over the counter) 100 mg twice a day.  Use MiraLax (over the counter) for constipation as needed.   Diet - low sodium heart healthy   Complete by:  As directed    Increase activity slowly as tolerated   Complete by:  As directed       Allergies as of 11/20/2016      Reactions   Statins Other (See Comments)   REACTION: Reaction not known      Medication List    TAKE these medications   amLODipine 10 MG tablet Commonly known as:  NORVASC Take 1 tablet (10 mg total) by mouth daily.   aspirin 81 MG tablet Take 81 mg by mouth daily.   atorvastatin 10 MG tablet Commonly known as:  LIPITOR Take one 10 mg tablet by mouth on Monday,Wednesday and Friday.   benazepril 40 MG tablet Commonly known as:  LOTENSIN Take 1 tablet (40 mg total) by mouth daily.   CENTRUM SILVER PO Take 1 tablet by mouth daily.   cephALEXin 500 MG capsule Commonly known as:  KEFLEX Take 1 capsule (500 mg total) 4 (four) times daily by mouth.   Coenzyme Q10 200 MG Tabs Take 1 tablet by mouth daily.   ezetimibe 10 MG tablet Commonly known as:  ZETIA Take 1 tablet (10 mg total) by mouth daily.   glucosamine-chondroitin 500-400 MG tablet Take 1 tablet by mouth daily.   metoprolol tartrate 25 MG tablet Commonly known as:  LOPRESSOR Take 1 tablet (25 mg total) by mouth 2 (two) times daily.   OMEGA-3 FATTY ACIDS PO Take 1,200 mg by mouth 2 (two) times daily.   oxyCODONE 5 MG immediate release tablet Commonly known as:  Oxy IR/ROXICODONE Take 1 tablet (5 mg total) every 3 (three) hours as needed by mouth for moderate pain ((score 4 to 6)).       Signed: Ventura Bruns 11/20/2016, 12:28 PM

## 2016-11-20 NOTE — Progress Notes (Signed)
   Subjective: 2 Days Post-Op Procedure(s) (LRB): IRRIGATION AND DEBRIDEMENT RIGHT LONG AND RING FINGER (Right)  Pt doing well Mild soreness/pain in the right hand today Denies any new symptoms or issues Patient reports pain as mild.  Objective:   VITALS:   Vitals:   11/19/16 2051 11/20/16 0518  BP: (!) 120/52 132/60  Pulse: 72 70  Resp: 16 16  Temp: 100.3 F (37.9 C) 99.5 F (37.5 C)  SpO2: 92% 94%    Right 3rd finger incision healing well Reduced edema and erythema in the right hand nv intact distally  LABS Recent Labs    11/18/16 1219  HGB 12.6*  HCT 37.7*  WBC 12.8*  PLT 154    Recent Labs    11/18/16 1219  NA 136  K 4.0  BUN 20  CREATININE 1.41*  GLUCOSE 111*     Assessment/Plan: 2 Days Post-Op Procedure(s) (LRB): IRRIGATION AND DEBRIDEMENT RIGHT LONG AND RING FINGER (Right) D/c home today on augmentin F/u in with Dr. Caralyn Guile later this week Pain management as needed    Merla Riches, MPAS, PA-C  11/20/2016, 12:25 PM

## 2016-11-23 DIAGNOSIS — S60414D Abrasion of right ring finger, subsequent encounter: Secondary | ICD-10-CM | POA: Diagnosis not present

## 2016-11-23 DIAGNOSIS — L03011 Cellulitis of right finger: Secondary | ICD-10-CM | POA: Diagnosis not present

## 2016-11-23 DIAGNOSIS — S60413D Abrasion of left middle finger, subsequent encounter: Secondary | ICD-10-CM | POA: Diagnosis not present

## 2016-11-23 LAB — AEROBIC/ANAEROBIC CULTURE W GRAM STAIN (SURGICAL/DEEP WOUND)

## 2016-11-26 DIAGNOSIS — L089 Local infection of the skin and subcutaneous tissue, unspecified: Secondary | ICD-10-CM | POA: Diagnosis not present

## 2016-11-26 DIAGNOSIS — S60414D Abrasion of right ring finger, subsequent encounter: Secondary | ICD-10-CM | POA: Diagnosis not present

## 2016-11-26 DIAGNOSIS — S60413D Abrasion of left middle finger, subsequent encounter: Secondary | ICD-10-CM | POA: Diagnosis not present

## 2016-12-03 DIAGNOSIS — S60413D Abrasion of left middle finger, subsequent encounter: Secondary | ICD-10-CM | POA: Diagnosis not present

## 2016-12-03 DIAGNOSIS — S60414D Abrasion of right ring finger, subsequent encounter: Secondary | ICD-10-CM | POA: Diagnosis not present

## 2016-12-03 DIAGNOSIS — L089 Local infection of the skin and subcutaneous tissue, unspecified: Secondary | ICD-10-CM | POA: Diagnosis not present

## 2016-12-10 DIAGNOSIS — L089 Local infection of the skin and subcutaneous tissue, unspecified: Secondary | ICD-10-CM | POA: Diagnosis not present

## 2016-12-10 DIAGNOSIS — S60414D Abrasion of right ring finger, subsequent encounter: Secondary | ICD-10-CM | POA: Diagnosis not present

## 2016-12-10 DIAGNOSIS — M25641 Stiffness of right hand, not elsewhere classified: Secondary | ICD-10-CM | POA: Diagnosis not present

## 2016-12-10 DIAGNOSIS — S60413D Abrasion of left middle finger, subsequent encounter: Secondary | ICD-10-CM | POA: Diagnosis not present

## 2016-12-13 DIAGNOSIS — H9193 Unspecified hearing loss, bilateral: Secondary | ICD-10-CM | POA: Diagnosis not present

## 2016-12-13 DIAGNOSIS — H6123 Impacted cerumen, bilateral: Secondary | ICD-10-CM | POA: Diagnosis not present

## 2016-12-14 DIAGNOSIS — M25641 Stiffness of right hand, not elsewhere classified: Secondary | ICD-10-CM | POA: Diagnosis not present

## 2016-12-18 DIAGNOSIS — M25641 Stiffness of right hand, not elsewhere classified: Secondary | ICD-10-CM | POA: Diagnosis not present

## 2016-12-21 DIAGNOSIS — M25641 Stiffness of right hand, not elsewhere classified: Secondary | ICD-10-CM | POA: Diagnosis not present

## 2016-12-25 DIAGNOSIS — M25641 Stiffness of right hand, not elsewhere classified: Secondary | ICD-10-CM | POA: Diagnosis not present

## 2016-12-27 ENCOUNTER — Encounter (HOSPITAL_COMMUNITY): Payer: Self-pay | Admitting: Orthopedic Surgery

## 2016-12-28 DIAGNOSIS — M25641 Stiffness of right hand, not elsewhere classified: Secondary | ICD-10-CM | POA: Diagnosis not present

## 2017-01-07 DIAGNOSIS — S60413D Abrasion of left middle finger, subsequent encounter: Secondary | ICD-10-CM | POA: Diagnosis not present

## 2017-01-07 DIAGNOSIS — M25641 Stiffness of right hand, not elsewhere classified: Secondary | ICD-10-CM | POA: Diagnosis not present

## 2017-01-07 DIAGNOSIS — S60414D Abrasion of right ring finger, subsequent encounter: Secondary | ICD-10-CM | POA: Diagnosis not present

## 2017-02-01 ENCOUNTER — Other Ambulatory Visit: Payer: Self-pay | Admitting: Cardiovascular Disease

## 2017-02-18 DIAGNOSIS — L089 Local infection of the skin and subcutaneous tissue, unspecified: Secondary | ICD-10-CM | POA: Insufficient documentation

## 2017-02-18 DIAGNOSIS — S60412D Abrasion of right middle finger, subsequent encounter: Secondary | ICD-10-CM | POA: Diagnosis not present

## 2017-02-27 DIAGNOSIS — M9903 Segmental and somatic dysfunction of lumbar region: Secondary | ICD-10-CM | POA: Diagnosis not present

## 2017-02-27 DIAGNOSIS — M5441 Lumbago with sciatica, right side: Secondary | ICD-10-CM | POA: Diagnosis not present

## 2017-02-27 DIAGNOSIS — M9904 Segmental and somatic dysfunction of sacral region: Secondary | ICD-10-CM | POA: Diagnosis not present

## 2017-03-06 DIAGNOSIS — M9904 Segmental and somatic dysfunction of sacral region: Secondary | ICD-10-CM | POA: Diagnosis not present

## 2017-03-06 DIAGNOSIS — M9903 Segmental and somatic dysfunction of lumbar region: Secondary | ICD-10-CM | POA: Diagnosis not present

## 2017-03-06 DIAGNOSIS — M5441 Lumbago with sciatica, right side: Secondary | ICD-10-CM | POA: Diagnosis not present

## 2017-03-12 DIAGNOSIS — H02132 Senile ectropion of right lower eyelid: Secondary | ICD-10-CM | POA: Diagnosis not present

## 2017-03-12 DIAGNOSIS — H04123 Dry eye syndrome of bilateral lacrimal glands: Secondary | ICD-10-CM | POA: Diagnosis not present

## 2017-03-12 DIAGNOSIS — H02135 Senile ectropion of left lower eyelid: Secondary | ICD-10-CM | POA: Diagnosis not present

## 2017-03-12 DIAGNOSIS — H26492 Other secondary cataract, left eye: Secondary | ICD-10-CM | POA: Diagnosis not present

## 2017-03-13 DIAGNOSIS — M9903 Segmental and somatic dysfunction of lumbar region: Secondary | ICD-10-CM | POA: Diagnosis not present

## 2017-03-13 DIAGNOSIS — M5441 Lumbago with sciatica, right side: Secondary | ICD-10-CM | POA: Diagnosis not present

## 2017-03-13 DIAGNOSIS — M9904 Segmental and somatic dysfunction of sacral region: Secondary | ICD-10-CM | POA: Diagnosis not present

## 2017-04-03 DIAGNOSIS — M5441 Lumbago with sciatica, right side: Secondary | ICD-10-CM | POA: Diagnosis not present

## 2017-04-03 DIAGNOSIS — M9904 Segmental and somatic dysfunction of sacral region: Secondary | ICD-10-CM | POA: Diagnosis not present

## 2017-04-03 DIAGNOSIS — M9903 Segmental and somatic dysfunction of lumbar region: Secondary | ICD-10-CM | POA: Diagnosis not present

## 2017-04-12 DIAGNOSIS — H26492 Other secondary cataract, left eye: Secondary | ICD-10-CM | POA: Diagnosis not present

## 2017-04-17 DIAGNOSIS — M9903 Segmental and somatic dysfunction of lumbar region: Secondary | ICD-10-CM | POA: Diagnosis not present

## 2017-04-17 DIAGNOSIS — M5441 Lumbago with sciatica, right side: Secondary | ICD-10-CM | POA: Diagnosis not present

## 2017-04-17 DIAGNOSIS — M9904 Segmental and somatic dysfunction of sacral region: Secondary | ICD-10-CM | POA: Diagnosis not present

## 2017-05-01 DIAGNOSIS — M9904 Segmental and somatic dysfunction of sacral region: Secondary | ICD-10-CM | POA: Diagnosis not present

## 2017-05-01 DIAGNOSIS — M5441 Lumbago with sciatica, right side: Secondary | ICD-10-CM | POA: Diagnosis not present

## 2017-05-01 DIAGNOSIS — M9903 Segmental and somatic dysfunction of lumbar region: Secondary | ICD-10-CM | POA: Diagnosis not present

## 2017-05-22 DIAGNOSIS — M5441 Lumbago with sciatica, right side: Secondary | ICD-10-CM | POA: Diagnosis not present

## 2017-05-22 DIAGNOSIS — M9903 Segmental and somatic dysfunction of lumbar region: Secondary | ICD-10-CM | POA: Diagnosis not present

## 2017-05-22 DIAGNOSIS — M9904 Segmental and somatic dysfunction of sacral region: Secondary | ICD-10-CM | POA: Diagnosis not present

## 2017-06-06 DIAGNOSIS — M9903 Segmental and somatic dysfunction of lumbar region: Secondary | ICD-10-CM | POA: Diagnosis not present

## 2017-06-06 DIAGNOSIS — M5441 Lumbago with sciatica, right side: Secondary | ICD-10-CM | POA: Diagnosis not present

## 2017-06-06 DIAGNOSIS — M9904 Segmental and somatic dysfunction of sacral region: Secondary | ICD-10-CM | POA: Diagnosis not present

## 2017-06-10 ENCOUNTER — Other Ambulatory Visit: Payer: Self-pay

## 2017-06-10 MED ORDER — EZETIMIBE 10 MG PO TABS: 10.0000 mg | ORAL_TABLET | Freq: Every day | ORAL | 1 refills | Status: DC

## 2017-06-10 MED ORDER — METOPROLOL TARTRATE 25 MG PO TABS: 25.0000 mg | ORAL_TABLET | Freq: Two times a day (BID) | ORAL | 1 refills | Status: DC

## 2017-06-10 MED ORDER — AMLODIPINE BESYLATE 10 MG PO TABS: 10.0000 mg | ORAL_TABLET | Freq: Every day | ORAL | 1 refills | Status: DC

## 2017-06-10 MED ORDER — BENAZEPRIL HCL 40 MG PO TABS: 40.0000 mg | ORAL_TABLET | Freq: Every day | ORAL | 1 refills | Status: DC

## 2017-06-19 DIAGNOSIS — M9903 Segmental and somatic dysfunction of lumbar region: Secondary | ICD-10-CM | POA: Diagnosis not present

## 2017-06-19 DIAGNOSIS — M9904 Segmental and somatic dysfunction of sacral region: Secondary | ICD-10-CM | POA: Diagnosis not present

## 2017-06-19 DIAGNOSIS — M5441 Lumbago with sciatica, right side: Secondary | ICD-10-CM | POA: Diagnosis not present

## 2017-07-04 DIAGNOSIS — M9903 Segmental and somatic dysfunction of lumbar region: Secondary | ICD-10-CM | POA: Diagnosis not present

## 2017-07-04 DIAGNOSIS — M5441 Lumbago with sciatica, right side: Secondary | ICD-10-CM | POA: Diagnosis not present

## 2017-07-04 DIAGNOSIS — M9904 Segmental and somatic dysfunction of sacral region: Secondary | ICD-10-CM | POA: Diagnosis not present

## 2017-07-09 ENCOUNTER — Other Ambulatory Visit: Payer: Self-pay | Admitting: Cardiovascular Disease

## 2017-07-09 MED ORDER — EZETIMIBE 10 MG PO TABS: 10.0000 mg | ORAL_TABLET | Freq: Every day | ORAL | 0 refills | Status: DC

## 2017-07-09 MED ORDER — BENAZEPRIL HCL 40 MG PO TABS: 40.0000 mg | ORAL_TABLET | Freq: Every day | ORAL | 0 refills | Status: DC

## 2017-07-09 MED ORDER — METOPROLOL TARTRATE 25 MG PO TABS: 25.0000 mg | ORAL_TABLET | Freq: Two times a day (BID) | ORAL | 0 refills | Status: DC

## 2017-07-09 MED ORDER — AMLODIPINE BESYLATE 10 MG PO TABS: 10.0000 mg | ORAL_TABLET | Freq: Every day | ORAL | 0 refills | Status: DC

## 2017-07-09 NOTE — Telephone Encounter (Signed)
New Message     *STAT* If patient is at the pharmacy, call can be transferred to refill team.   1. Which medications need to be refilled? (please list name of each medication and dose if known)  metoprolol tartrate (LOPRESSOR) 25 MG tablet  amLODipine (NORVASC) 10 MG tablet ezetimibe (ZETIA) 10 MG tablet benazepril (LOTENSIN) 40 MG tablet  2. Which pharmacy/location (including street and city if local pharmacy) is medication to be sent to? [place]  3. Do they need a 30 day or 90 day supply? 90 days if possible

## 2017-07-09 NOTE — Telephone Encounter (Signed)
Pt's medications was sent to pt's pharmacy as requested. Confirmation received.  

## 2017-08-06 DIAGNOSIS — M549 Dorsalgia, unspecified: Secondary | ICD-10-CM | POA: Diagnosis not present

## 2017-08-06 DIAGNOSIS — M4800 Spinal stenosis, site unspecified: Secondary | ICD-10-CM | POA: Diagnosis not present

## 2017-08-20 DIAGNOSIS — M9903 Segmental and somatic dysfunction of lumbar region: Secondary | ICD-10-CM | POA: Diagnosis not present

## 2017-08-20 DIAGNOSIS — M9904 Segmental and somatic dysfunction of sacral region: Secondary | ICD-10-CM | POA: Diagnosis not present

## 2017-08-20 DIAGNOSIS — M5441 Lumbago with sciatica, right side: Secondary | ICD-10-CM | POA: Diagnosis not present

## 2017-08-22 DIAGNOSIS — M47816 Spondylosis without myelopathy or radiculopathy, lumbar region: Secondary | ICD-10-CM | POA: Diagnosis not present

## 2017-08-22 DIAGNOSIS — M545 Low back pain: Secondary | ICD-10-CM | POA: Diagnosis not present

## 2017-08-22 DIAGNOSIS — M5416 Radiculopathy, lumbar region: Secondary | ICD-10-CM | POA: Diagnosis not present

## 2017-08-22 DIAGNOSIS — M48061 Spinal stenosis, lumbar region without neurogenic claudication: Secondary | ICD-10-CM | POA: Diagnosis not present

## 2017-09-04 DIAGNOSIS — M5441 Lumbago with sciatica, right side: Secondary | ICD-10-CM | POA: Diagnosis not present

## 2017-09-04 DIAGNOSIS — M9904 Segmental and somatic dysfunction of sacral region: Secondary | ICD-10-CM | POA: Diagnosis not present

## 2017-09-04 DIAGNOSIS — M9903 Segmental and somatic dysfunction of lumbar region: Secondary | ICD-10-CM | POA: Diagnosis not present

## 2017-09-11 DIAGNOSIS — M9903 Segmental and somatic dysfunction of lumbar region: Secondary | ICD-10-CM | POA: Diagnosis not present

## 2017-09-11 DIAGNOSIS — M5441 Lumbago with sciatica, right side: Secondary | ICD-10-CM | POA: Diagnosis not present

## 2017-09-11 DIAGNOSIS — M9904 Segmental and somatic dysfunction of sacral region: Secondary | ICD-10-CM | POA: Diagnosis not present

## 2017-09-20 ENCOUNTER — Other Ambulatory Visit: Payer: Self-pay

## 2017-09-20 MED ORDER — BENAZEPRIL HCL 40 MG PO TABS: 40.0000 mg | ORAL_TABLET | Freq: Every day | ORAL | 0 refills | Status: DC

## 2017-09-20 MED ORDER — ATORVASTATIN CALCIUM 10 MG PO TABS: ORAL_TABLET | ORAL | 0 refills | Status: DC

## 2017-09-20 MED ORDER — AMLODIPINE BESYLATE 10 MG PO TABS: 10.0000 mg | ORAL_TABLET | Freq: Every day | ORAL | 0 refills | Status: DC

## 2017-09-20 MED ORDER — METOPROLOL TARTRATE 25 MG PO TABS: 25.0000 mg | ORAL_TABLET | Freq: Two times a day (BID) | ORAL | 0 refills | Status: DC

## 2017-09-20 MED ORDER — EZETIMIBE 10 MG PO TABS: 10.0000 mg | ORAL_TABLET | Freq: Every day | ORAL | 0 refills | Status: DC

## 2017-09-23 DIAGNOSIS — M9903 Segmental and somatic dysfunction of lumbar region: Secondary | ICD-10-CM | POA: Diagnosis not present

## 2017-09-23 DIAGNOSIS — M9904 Segmental and somatic dysfunction of sacral region: Secondary | ICD-10-CM | POA: Diagnosis not present

## 2017-09-23 DIAGNOSIS — M5441 Lumbago with sciatica, right side: Secondary | ICD-10-CM | POA: Diagnosis not present

## 2017-09-25 ENCOUNTER — Encounter

## 2017-09-28 DIAGNOSIS — Z23 Encounter for immunization: Secondary | ICD-10-CM | POA: Diagnosis not present

## 2017-09-30 ENCOUNTER — Telehealth: Payer: Self-pay | Admitting: Cardiovascular Disease

## 2017-09-30 DIAGNOSIS — M25539 Pain in unspecified wrist: Secondary | ICD-10-CM | POA: Diagnosis not present

## 2017-09-30 NOTE — Telephone Encounter (Signed)
New Message   Pt c/o medication issue:  1. Name of Medication: meloxicam   2. How are you currently taking this medication (dosage and times per day)? 15 mg tablets  3. Are you having a reaction (difficulty breathing--STAT)? no  4. What is your medication issue? Pt states that he was prescribed this medication but before he takes it he wants to know if it will interfere with what he is all ready taking [phone]

## 2017-09-30 NOTE — Telephone Encounter (Signed)
Meloxicam will not interfere with any of his medications but would still recommend that pt limit use to lowest effective dose for shortest duration. NSAIDs carry [employer] for increased risk of cardiovascular thrombotic events and pt has a history of both CAD and DVT. Would also advise him to take with food as well to minimize risk for GI bleed.

## 2017-09-30 NOTE — Telephone Encounter (Signed)
Informed patient of PharmD's recommendations. He does not wish to start Meloxicam at this time. He will try Tylenol Arthritis before starting Meloxicam.  He was grateful for assistance.

## 2017-10-07 DIAGNOSIS — M9903 Segmental and somatic dysfunction of lumbar region: Secondary | ICD-10-CM | POA: Diagnosis not present

## 2017-10-07 DIAGNOSIS — M9904 Segmental and somatic dysfunction of sacral region: Secondary | ICD-10-CM | POA: Diagnosis not present

## 2017-10-07 DIAGNOSIS — M5441 Lumbago with sciatica, right side: Secondary | ICD-10-CM | POA: Diagnosis not present

## 2017-10-25 ENCOUNTER — Ambulatory Visit (INDEPENDENT_AMBULATORY_CARE_PROVIDER_SITE_OTHER): Payer: Medicare Other | Admitting: Cardiovascular Disease

## 2017-10-25 ENCOUNTER — Encounter: Payer: Self-pay | Admitting: Cardiovascular Disease

## 2017-10-25 VITALS — BP 138/62 | HR 58 | Ht 69.0 in | Wt 204.0 lb

## 2017-10-25 DIAGNOSIS — I2510 Atherosclerotic heart disease of native coronary artery without angina pectoris: Secondary | ICD-10-CM

## 2017-10-25 DIAGNOSIS — I10 Essential (primary) hypertension: Secondary | ICD-10-CM | POA: Diagnosis not present

## 2017-10-25 DIAGNOSIS — E782 Mixed hyperlipidemia: Secondary | ICD-10-CM

## 2017-10-25 MED ORDER — AMLODIPINE BESYLATE 10 MG PO TABS: 10.0000 mg | ORAL_TABLET | Freq: Every day | ORAL | 3 refills | Status: DC

## 2017-10-25 MED ORDER — METOPROLOL TARTRATE 25 MG PO TABS: 25.0000 mg | ORAL_TABLET | Freq: Two times a day (BID) | ORAL | 3 refills | Status: DC

## 2017-10-25 MED ORDER — ATORVASTATIN CALCIUM 10 MG PO TABS: 10.0000 mg | ORAL_TABLET | ORAL | 3 refills | Status: DC

## 2017-10-25 MED ORDER — EZETIMIBE 10 MG PO TABS: 10.0000 mg | ORAL_TABLET | Freq: Every day | ORAL | 3 refills | Status: DC

## 2017-10-25 NOTE — Patient Instructions (Addendum)
Medication Instructions:  Your refills have been sent!  Labwork: MONDAY: CMET, lipids  Testing/Procedures: None  Follow-Up: Your provider wants you to follow-up in: 1 year with Dr. Burt Knack or his assistant. You will receive a reminder letter in the mail two months in advance. If you don't receive a letter, please call our office to schedule the follow-up appointment.    Any Other Special Instructions Will Be Listed Below (If Applicable).     If you need a refill on your cardiac medications before your next appointment, please call your pharmacy.

## 2017-10-25 NOTE — Progress Notes (Signed)
Cardiology Office Note:    Date:  10/25/2017   ID:  Luke [name], DOB [birth year], MRN [id number]  PCP:  Sherren Mocha, MD  Cardiologist:  No primary care provider on file.  Electrophysiologist:  None   Referring MD: Sherren Mocha, MD   Chief Complaint  Patient presents with  . Coronary Artery Disease    History of Present Illness:    Luke [name] is a 84 y.o. male with a hx of coronary artery disease, presenting for follow-up evaluation.  Patient underwent remote CABG over 15 years ago.  His last stress test in 2017 demonstrated no ischemia and normal LV function.  The patient was hospitalized with chest pain in 2018 and underwent cardiac catheterization demonstrating patency of all of his bypass grafts.  He is here alone today. Feels like he's slowing down from age-related problems, but no specific cardiac complaints. He has some low back problems and mild gait unsteadiness. Still able to play golf a few days/week.  He denies chest pain, shortness of breath, or heart palpitations.  No orthopnea or PND.  Past Medical History:  Diagnosis Date  . Atrial fibrillation (Luke [name])   . CAD (coronary artery disease)     5 with the left internal mamary to the left anterior  descending coronary artery.  [name] Kitchen DVT femoral (deep venous thrombosis) with thrombophlebitis ([place])   . Dyslipidemia   . HTN (hypertension)     Past Surgical History:  Procedure Laterality Date  . CORONARY ARTERY BYPASS GRAFT  2001  . I&D EXTREMITY Right 11/18/2016   Procedure: IRRIGATION AND DEBRIDEMENT RIGHT LONG AND RING FINGER;  Surgeon: Netta Cedars, MD;  Location: [facility];  Service: Orthopedics;  Laterality: Right;  . LEFT HEART CATH AND CORS/GRAFTS ANGIOGRAPHY N/A 07/19/2016   Procedure: Left Heart Cath and Cors/Grafts Angiography;  Surgeon: Jettie Booze, MD;  Location: [facility];  Service: Cardiovascular;  Laterality: N/A;    Current Medications: Current Meds  Medication Sig  .  amLODipine (NORVASC) 10 MG tablet Take 1 tablet (10 mg total) by mouth daily.  [name] Kitchen aspirin 81 MG tablet Take 81 mg by mouth daily.    [name] Kitchen atorvastatin (LIPITOR) 10 MG tablet Take 1 tablet (10 mg total) by mouth every Monday, Wednesday, and Friday.  . benazepril (LOTENSIN) 40 MG tablet Take 1 tablet (40 mg total) by mouth daily. Please keep upcoming appt in October for future refills. Thank you  . cephALEXin (KEFLEX) 500 MG capsule Take 1 capsule (500 mg total) 4 (four) times daily by mouth.  . Coenzyme Q10 200 MG TABS Take 1 tablet by mouth daily.  [name] Kitchen ezetimibe (ZETIA) 10 MG tablet Take 1 tablet (10 mg total) by mouth daily.  [name] Kitchen glucosamine-chondroitin 500-400 MG tablet Take 1 tablet by mouth daily.   . metoprolol tartrate (LOPRESSOR) 25 MG tablet Take 1 tablet (25 mg total) by mouth 2 (two) times daily.  . Multiple Vitamins-Minerals (CENTRUM SILVER PO) Take 1 tablet by mouth daily.   . OMEGA-3 FATTY ACIDS PO Take 1,200 mg by mouth 2 (two) times daily.  [name] Kitchen oxyCODONE (OXY IR/ROXICODONE) 5 MG immediate release tablet Take 1 tablet (5 mg total) every 3 (three) hours as needed by mouth for moderate pain ((score 4 to 6)).  . [DISCONTINUED] amLODipine (NORVASC) 10 MG tablet Take 1 tablet (10 mg total) by mouth daily. Please keep upcoming appt in October for future refills. Thank you  . [DISCONTINUED] atorvastatin (LIPITOR) 10 MG tablet TAKE 1 TABLET BY MOUTH  ON  MONDAY,WEDNESDAY AND  FRIDAY.  . [DISCONTINUED] ezetimibe (ZETIA) 10 MG tablet Take 1 tablet (10 mg total) by mouth daily. Please keep upcoming appt in October for future refills. Thank you  . [DISCONTINUED] metoprolol tartrate (LOPRESSOR) 25 MG tablet Take 1 tablet (25 mg total) by mouth 2 (two) times daily. Please keep upcoming appt in October for future refills. Thank you     Allergies:   Statins   Social History   Socioeconomic History  . Marital status: Married    Spouse name: Not on file  . Number of children: Not on file  . Years of  education: Not on file  . Highest education level: Not on file  Occupational History  . Not on file  Social Needs  . Financial resource strain: Not on file  . Food insecurity:    Worry: Not on file    Inability: Not on file  . Transportation needs:    Medical: Not on file    Non-medical: Not on file  Tobacco Use  . Smoking status: Former Research scientist (life sciences)  . Smokeless tobacco: Never Used  . Tobacco comment: stopped in the 1950's  Substance and Sexual Activity  . Alcohol use: Yes    Alcohol/week: 0.0 standard drinks    Comment: occ  . Drug use: No  . Sexual activity: Not on file  Lifestyle  . Physical activity:    Days per week: Not on file    Minutes per session: Not on file  . Stress: Not on file  Relationships  . Social connections:    Talks on phone: Not on file    Gets together: Not on file    Attends religious service: Not on file    Active member of club or organization: Not on file    Attends meetings of clubs or organizations: Not on file    Relationship status: Not on file  Other Topics Concern  . Not on file  Social History Narrative   He lives in [place] with his wife.  He does not smoke.He drives about two hours a day which increases his risk for DVT.  He is retired.  He is a Psychologist, occupational at [employer].            Family History: The patient's family history includes Cancer in his mother; Coronary artery disease in his unknown relative; Heart attack in his father; Heart disease in his father and mother.  ROS:   Please see the history of present illness.    Positive for visual changes, back pain, muscle pain, easy bruising, leg pain, anxiety.  All other systems reviewed and are negative.  EKGs/Labs/Other Studies Reviewed:    The following studies were reviewed today: Cardiac Cath 07-19-2016: Conclusion     Ost Cx to Prox Cx lesion, 90 %stenosed.  Mid Cx lesion, 90 %stenosed.  Ost 1st Mrg to 1st Mrg lesion, 90 %stenosed.  Patent jump  graft SVG to OM1 and O2  Mid LAD lesion, 75 %stenosed. LIMA to LAD is widely patent.  Ost 1st Diag lesion, 100 %stenosed.  Ost 2nd Diag lesion, 75 %stenosed.  Patent jump graft SVG to first and second diagonals. Mild lesion in the proximal graft.  The left ventricular systolic function is normal.  LV end diastolic pressure is mildly elevated.  The left ventricular ejection fraction is 50-55% by visual estimate.  There is no aortic valve stenosis.  Would not use left radial approach in the future if cath was needed,  due to tortuosity.   Continue aggressive secondary prevention.   Diagnostic Diagram       EKG:  EKG is ordered today.  The ekg ordered today demonstrates sinus rhythm 58 bpm, first-degree AV block, PACs, nonspecific ST abnormality.  Recent Labs: 11/18/2016: BUN 20; Creatinine, Ser 1.41; Hemoglobin 12.6; Platelets 154; Potassium 4.0; Sodium 136  Recent Lipid Panel    Component Value Date/Time   CHOL 140 05/04/2016 1005   TRIG 103 05/04/2016 1005   TRIG 89 11/15/2005 0820   HDL 37 (L) 05/04/2016 1005   CHOLHDL 3.8 05/04/2016 1005   CHOLHDL 4.2 02/28/2015 1541   VLDL 23 02/28/2015 1541   LDLCALC 82 05/04/2016 1005   LDLDIRECT 139.9 06/18/2006 1008    Physical Exam:    VS:  BP 138/62   Pulse (!) 58   Ht 5\' 9"  (1.753 m)   Wt 204 lb (92.5 kg)   BMI 30.13 kg/m     Wt Readings from Last 3 Encounters:  10/25/17 204 lb (92.5 kg)  11/18/16 200 lb (90.7 kg)  11/16/16 200 lb (90.7 kg)     GEN: pleasant elderly male, Well nourished, well developed in no acute distress HEENT: Normal NECK: No JVD; No carotid bruits LYMPHATICS: No lymphadenopathy CARDIAC: RRR, no murmurs, rubs, gallops RESPIRATORY:  Clear to auscultation without rales, wheezing or rhonchi  ABDOMEN: Soft, non-tender, non-distended MUSCULOSKELETAL:  No edema; No deformity  SKIN: Warm and dry, bruising both arms NEUROLOGIC:  Alert and oriented x 3 PSYCHIATRIC:  Normal affect    ASSESSMENT:    1. Coronary artery disease involving native coronary artery of native heart without angina pectoris   2. Mixed hyperlipidemia   3. Essential hypertension    PLAN:    In order of problems listed above:  1. The patient is stable without symptoms of angina.  He will continue on aspirin, statin drug, and a beta-blocker.  His cardiac catheterization last year demonstrated continued patency of all bypass grafts. 2. Treated with atorvastatin and ezetimibe.  Atorvastatin dosing is limited by tolerance, currently taking 3 days/week.  Will update his lipids. 3. Blood pressure controlled on benazepril, amlodipine, and metoprolol.  Medication Adjustments/Labs and Tests Ordered: Current medicines are reviewed at length with the patient today.  Concerns regarding medicines are outlined above.  Orders Placed This Encounter  Procedures  . Comprehensive metabolic panel  . Lipid panel  . EKG 12-Lead   Meds ordered this encounter  Medications  . amLODipine (NORVASC) 10 MG tablet    Sig: Take 1 tablet (10 mg total) by mouth daily.    Dispense:  90 tablet    Refill:  3  . atorvastatin (LIPITOR) 10 MG tablet    Sig: Take 1 tablet (10 mg total) by mouth every Monday, Wednesday, and Friday.    Dispense:  36 tablet    Refill:  3  . ezetimibe (ZETIA) 10 MG tablet    Sig: Take 1 tablet (10 mg total) by mouth daily.    Dispense:  90 tablet    Refill:  3  . metoprolol tartrate (LOPRESSOR) 25 MG tablet    Sig: Take 1 tablet (25 mg total) by mouth 2 (two) times daily.    Dispense:  180 tablet    Refill:  3    Patient Instructions  Medication Instructions:  Your refills have been sent!  Labwork: MONDAY: CMET, lipids  Testing/Procedures: None  Follow-Up: Your provider wants you to follow-up in: 1 year with Dr. Burt Knack or his assistant.  You will receive a reminder letter in the mail two months in advance. If you don't receive a letter, please call our office to schedule the  follow-up appointment.    Any Other Special Instructions Will Be Listed Below (If Applicable).     If you need a refill on your cardiac medications before your next appointment, please call your pharmacy.      Signed, Sherren Mocha, MD  10/25/2017 1:16 PM    [facility]

## 2017-10-28 ENCOUNTER — Other Ambulatory Visit: Payer: Medicare Other | Admitting: *Deleted

## 2017-10-28 ENCOUNTER — Other Ambulatory Visit: Payer: Self-pay | Admitting: Cardiovascular Disease

## 2017-10-28 DIAGNOSIS — I2510 Atherosclerotic heart disease of native coronary artery without angina pectoris: Secondary | ICD-10-CM | POA: Diagnosis not present

## 2017-10-28 DIAGNOSIS — M9903 Segmental and somatic dysfunction of lumbar region: Secondary | ICD-10-CM | POA: Diagnosis not present

## 2017-10-28 DIAGNOSIS — M9904 Segmental and somatic dysfunction of sacral region: Secondary | ICD-10-CM | POA: Diagnosis not present

## 2017-10-28 DIAGNOSIS — M5441 Lumbago with sciatica, right side: Secondary | ICD-10-CM | POA: Diagnosis not present

## 2017-10-28 MED ORDER — AMLODIPINE BESYLATE 10 MG PO TABS: 10.0000 mg | ORAL_TABLET | Freq: Every day | ORAL | 3 refills | Status: DC

## 2017-10-28 MED ORDER — ATORVASTATIN CALCIUM 10 MG PO TABS: 10.0000 mg | ORAL_TABLET | ORAL | 3 refills | Status: DC

## 2017-10-28 MED ORDER — EZETIMIBE 10 MG PO TABS: 10.0000 mg | ORAL_TABLET | Freq: Every day | ORAL | 3 refills | Status: DC

## 2017-10-28 MED ORDER — METOPROLOL TARTRATE 25 MG PO TABS: 25.0000 mg | ORAL_TABLET | Freq: Two times a day (BID) | ORAL | 3 refills | Status: DC

## 2017-10-28 NOTE — Telephone Encounter (Signed)
Pt's medications were sent to pt's pharmacy as requested. Confirmation received.  

## 2017-10-29 LAB — COMPREHENSIVE METABOLIC PANEL
ALT: 18 IU/L (ref 0–44)
AST: 21 IU/L (ref 0–40)
Albumin/Globulin Ratio: 1.6 (ref 1.2–2.2)
Albumin: 4.1 g/dL (ref 3.5–4.7)
Alkaline Phosphatase: 38 IU/L — ABNORMAL LOW (ref 39–117)
BUN/Creatinine Ratio: 18 (ref 10–24)
BUN: 21 mg/dL (ref 8–27)
Bilirubin Total: 0.4 mg/dL (ref 0.0–1.2)
CO2: 23 mmol/L (ref 20–29)
Calcium: 9.4 mg/dL (ref 8.6–10.2)
Chloride: 106 mmol/L (ref 96–106)
Creatinine, Ser: 1.15 mg/dL (ref 0.76–1.27)
GFR calc Af Amer: 67 mL/min/{1.73_m2} (ref >59)
GFR calc non Af Amer: 58 mL/min/{1.73_m2} — ABNORMAL LOW (ref >59)
Globulin, Total: 2.5 g/dL (ref 1.5–4.5)
Glucose: 90 mg/dL (ref 65–99)
Potassium: 5.6 mmol/L — ABNORMAL HIGH (ref 3.5–5.2)
Sodium: 145 mmol/L — ABNORMAL HIGH (ref 134–144)
Total Protein: 6.6 g/dL (ref 6.0–8.5)

## 2017-10-29 LAB — LIPID PANEL
Chol/HDL Ratio: 3.5 ratio (ref 0.0–5.0)
Cholesterol, Total: 151 mg/dL (ref 100–199)
HDL: 43 mg/dL (ref >39)
LDL Calculated: 84 mg/dL (ref 0–99)
Triglycerides: 120 mg/dL (ref 0–149)
VLDL Cholesterol Cal: 24 mg/dL (ref 5–40)

## 2017-11-04 ENCOUNTER — Other Ambulatory Visit: Payer: Self-pay

## 2017-11-04 MED ORDER — BENAZEPRIL HCL 40 MG PO TABS: 40.0000 mg | ORAL_TABLET | Freq: Every day | ORAL | 3 refills | Status: DC

## 2017-11-07 ENCOUNTER — Telehealth: Payer: Self-pay | Admitting: Cardiovascular Disease

## 2017-11-07 DIAGNOSIS — I10 Essential (primary) hypertension: Secondary | ICD-10-CM

## 2017-11-07 NOTE — Telephone Encounter (Signed)
Repeat BMET scheduled 11/4. Patient was grateful for call and agrees with treatment plan.

## 2017-11-07 NOTE — Telephone Encounter (Signed)
-----   Message from Sherren Mocha, MD sent at 11/04/2017  5:51 PM EDT ----- Agree with recommendations. Would repeat BMET and will need to consider reducing his ACE-I dose if he continues to have hyperkalemia. thanks

## 2017-11-07 NOTE — Telephone Encounter (Signed)
New Message:   Patient returning call from yesterday.

## 2017-11-11 ENCOUNTER — Other Ambulatory Visit: Payer: Medicare Other | Admitting: *Deleted

## 2017-11-11 DIAGNOSIS — I10 Essential (primary) hypertension: Secondary | ICD-10-CM | POA: Diagnosis not present

## 2017-11-11 LAB — BASIC METABOLIC PANEL
BUN/Creatinine Ratio: 18 (ref 10–24)
BUN: 21 mg/dL (ref 8–27)
CO2: 23 mmol/L (ref 20–29)
Calcium: 9.3 mg/dL (ref 8.6–10.2)
Chloride: 103 mmol/L (ref 96–106)
Creatinine, Ser: 1.19 mg/dL (ref 0.76–1.27)
GFR calc Af Amer: 64 mL/min/{1.73_m2} (ref >59)
GFR calc non Af Amer: 55 mL/min/{1.73_m2} — ABNORMAL LOW (ref >59)
Glucose: 87 mg/dL (ref 65–99)
Potassium: 4.1 mmol/L (ref 3.5–5.2)
Sodium: 142 mmol/L (ref 134–144)

## 2017-11-14 ENCOUNTER — Telehealth: Payer: Self-pay | Admitting: Cardiovascular Disease

## 2017-11-14 NOTE — Telephone Encounter (Signed)
Patient is calling for results.  He said he can't get them from My chart as he can't sign in.

## 2017-11-14 NOTE — Telephone Encounter (Signed)
Informed patient of results. Deactivated MyChart account per patient request.

## 2017-11-20 DIAGNOSIS — H04123 Dry eye syndrome of bilateral lacrimal glands: Secondary | ICD-10-CM | POA: Diagnosis not present

## 2017-11-25 DIAGNOSIS — M9904 Segmental and somatic dysfunction of sacral region: Secondary | ICD-10-CM | POA: Diagnosis not present

## 2017-11-25 DIAGNOSIS — M9903 Segmental and somatic dysfunction of lumbar region: Secondary | ICD-10-CM | POA: Diagnosis not present

## 2017-11-25 DIAGNOSIS — M5441 Lumbago with sciatica, right side: Secondary | ICD-10-CM | POA: Diagnosis not present

## 2017-12-16 DIAGNOSIS — M9904 Segmental and somatic dysfunction of sacral region: Secondary | ICD-10-CM | POA: Diagnosis not present

## 2017-12-16 DIAGNOSIS — M9903 Segmental and somatic dysfunction of lumbar region: Secondary | ICD-10-CM | POA: Diagnosis not present

## 2017-12-16 DIAGNOSIS — M5441 Lumbago with sciatica, right side: Secondary | ICD-10-CM | POA: Diagnosis not present

## 2018-01-13 DIAGNOSIS — M5441 Lumbago with sciatica, right side: Secondary | ICD-10-CM | POA: Diagnosis not present

## 2018-01-13 DIAGNOSIS — M9903 Segmental and somatic dysfunction of lumbar region: Secondary | ICD-10-CM | POA: Diagnosis not present

## 2018-01-13 DIAGNOSIS — M9904 Segmental and somatic dysfunction of sacral region: Secondary | ICD-10-CM | POA: Diagnosis not present

## 2018-02-03 DIAGNOSIS — M9903 Segmental and somatic dysfunction of lumbar region: Secondary | ICD-10-CM | POA: Diagnosis not present

## 2018-02-03 DIAGNOSIS — M9904 Segmental and somatic dysfunction of sacral region: Secondary | ICD-10-CM | POA: Diagnosis not present

## 2018-02-03 DIAGNOSIS — M5441 Lumbago with sciatica, right side: Secondary | ICD-10-CM | POA: Diagnosis not present

## 2018-03-03 DIAGNOSIS — M9904 Segmental and somatic dysfunction of sacral region: Secondary | ICD-10-CM | POA: Diagnosis not present

## 2018-03-03 DIAGNOSIS — M5441 Lumbago with sciatica, right side: Secondary | ICD-10-CM | POA: Diagnosis not present

## 2018-03-03 DIAGNOSIS — M9903 Segmental and somatic dysfunction of lumbar region: Secondary | ICD-10-CM | POA: Diagnosis not present

## 2018-05-03 ENCOUNTER — Encounter (HOSPITAL_BASED_OUTPATIENT_CLINIC_OR_DEPARTMENT_OTHER): Payer: Self-pay | Admitting: Emergency Medicine

## 2018-05-03 ENCOUNTER — Emergency Department (HOSPITAL_BASED_OUTPATIENT_CLINIC_OR_DEPARTMENT_OTHER): Payer: Medicare Other

## 2018-05-03 ENCOUNTER — Other Ambulatory Visit: Payer: Self-pay

## 2018-05-03 ENCOUNTER — Emergency Department (HOSPITAL_BASED_OUTPATIENT_CLINIC_OR_DEPARTMENT_OTHER)
Admission: EM | Admit: 2018-05-03 | Discharge: 2018-05-03 | Disposition: A | Discharge status: Home / Self Care | Payer: Medicare Other | Attending: Emergency Medicine | Admitting: Emergency Medicine

## 2018-05-03 DIAGNOSIS — I10 Essential (primary) hypertension: Secondary | ICD-10-CM | POA: Insufficient documentation

## 2018-05-03 DIAGNOSIS — Z79899 Other long term (current) drug therapy: Secondary | ICD-10-CM | POA: Diagnosis not present

## 2018-05-03 DIAGNOSIS — S93601A Unspecified sprain of right foot, initial encounter: Secondary | ICD-10-CM

## 2018-05-03 DIAGNOSIS — M79671 Pain in right foot: Secondary | ICD-10-CM | POA: Insufficient documentation

## 2018-05-03 DIAGNOSIS — Z7982 Long term (current) use of aspirin: Secondary | ICD-10-CM | POA: Insufficient documentation

## 2018-05-03 DIAGNOSIS — Y939 Activity, unspecified: Secondary | ICD-10-CM | POA: Diagnosis not present

## 2018-05-03 DIAGNOSIS — S8391XA Sprain of unspecified site of right knee, initial encounter: Secondary | ICD-10-CM | POA: Diagnosis not present

## 2018-05-03 DIAGNOSIS — Z951 Presence of aortocoronary bypass graft: Secondary | ICD-10-CM | POA: Insufficient documentation

## 2018-05-03 DIAGNOSIS — M25461 Effusion, right knee: Secondary | ICD-10-CM | POA: Diagnosis not present

## 2018-05-03 DIAGNOSIS — Y92009 Unspecified place in unspecified non-institutional (private) residence as the place of occurrence of the external cause: Secondary | ICD-10-CM | POA: Diagnosis not present

## 2018-05-03 DIAGNOSIS — Z87891 Personal history of nicotine dependence: Secondary | ICD-10-CM | POA: Diagnosis not present

## 2018-05-03 DIAGNOSIS — X509XXA Other and unspecified overexertion or strenuous movements or postures, initial encounter: Secondary | ICD-10-CM | POA: Insufficient documentation

## 2018-05-03 DIAGNOSIS — I2510 Atherosclerotic heart disease of native coronary artery without angina pectoris: Secondary | ICD-10-CM | POA: Insufficient documentation

## 2018-05-03 DIAGNOSIS — S80911A Unspecified superficial injury of right knee, initial encounter: Secondary | ICD-10-CM | POA: Diagnosis present

## 2018-05-03 DIAGNOSIS — S79911A Unspecified injury of right hip, initial encounter: Secondary | ICD-10-CM | POA: Diagnosis not present

## 2018-05-03 DIAGNOSIS — Y999 Unspecified external cause status: Secondary | ICD-10-CM | POA: Insufficient documentation

## 2018-05-03 DIAGNOSIS — S99921A Unspecified injury of right foot, initial encounter: Secondary | ICD-10-CM | POA: Diagnosis not present

## 2018-05-03 DIAGNOSIS — M7989 Other specified soft tissue disorders: Secondary | ICD-10-CM | POA: Diagnosis not present

## 2018-05-03 DIAGNOSIS — M79604 Pain in right leg: Secondary | ICD-10-CM | POA: Diagnosis not present

## 2018-05-03 MED ORDER — DICLOFENAC SODIUM 1 % TD GEL: 2.0000 g | Freq: Four times a day (QID) | TRANSDERMAL | 2 refills | Status: DC

## 2018-05-03 NOTE — ED Notes (Signed)
Pt verbalized understanding of dc instructions.

## 2018-05-03 NOTE — ED Notes (Signed)
Patient transported to X-ray 

## 2018-05-03 NOTE — Discharge Instructions (Signed)
Elevate and rest as often as you can.  Use tylenol extra strength up to every 4-6 hours as needed for the pain if you have to.  Use the voltaren gel for pain as well.

## 2018-05-03 NOTE — ED Provider Notes (Signed)
[facility] EMERGENCY DEPARTMENT Provider Note   CSN: [id number] Arrival date & time: 05/03/18  1224    History   Chief Complaint Chief Complaint  Patient presents with  . Fall    HPI Luke [name] is a 85 y.o. male.     Patient is an 85 year old male with a history of hypertension, DVT, coronary artery disease and atrial fibrillation who is not anticoagulated who presents today with right foot pain and swelling that started yesterday.  Patient states that he had a fall 5 to 6 days ago where he stood up to start walking when he was out on his patio and his right leg gave out.  Patient states that he has a bad back and this can happen sometimes but it caused him to land on his knee, right elbow and shoulder.  He denies hitting his head or loss of consciousness.  He had some mild abrasions over his knee and elbow.  He had been staying at home and taking some Tylenol and wrapping and icing it.  However yesterday started noticing pain in the arch of his right foot and significant swelling that was worse today.  He has been using a walker to get around just because of the pain was uncomfortable.  He states his knee is significantly better and now he is able to bend and straighten it without difficulty.  He also states his elbow and shoulder are getting better as well.  Is had no further falls since this incident.  The history is provided by the patient.  Fall  This is a new problem. Episode onset: 5-6 days ago.    Past Medical History:  Diagnosis Date  . Atrial fibrillation ([place])   . CAD (coronary artery disease)     5 with the left internal mamary to the left anterior  descending coronary artery.  [name] Kitchen DVT femoral (deep venous thrombosis) with thrombophlebitis ([place])   . Dyslipidemia   . HTN (hypertension)     Patient Active Problem List   Diagnosis Date Noted  . Open wound of right hand with tendon involvement 11/18/2016  . Chest pain 07/18/2016  . Abnormal EKG   . Hx  of CABG   . Coronary artery disease involving native coronary artery of native heart with unstable angina pectoris ([place])   . Unstable angina ([place])   . Dyslipidemia, goal LDL below 70 04/16/2008  . Essential hypertension 04/16/2008  . CAD, ARTERY BYPASS GRAFT 04/16/2008  . DEEP VENOUS THROMBOPHLEBITIS, HX OF 04/16/2008  . ATRIAL FIBRILLATION, HX OF 04/16/2008    Past Surgical History:  Procedure Laterality Date  . CORONARY ARTERY BYPASS GRAFT  2001  . I&D EXTREMITY Right 11/18/2016   Procedure: IRRIGATION AND DEBRIDEMENT RIGHT LONG AND RING FINGER;  Surgeon: Netta Cedars, MD;  Location: [facility];  Service: Orthopedics;  Laterality: Right;  . LEFT HEART CATH AND CORS/GRAFTS ANGIOGRAPHY N/A 07/19/2016   Procedure: Left Heart Cath and Cors/Grafts Angiography;  Surgeon: Jettie Booze, MD;  Location: [facility];  Service: Cardiovascular;  Laterality: N/A;        Home Medications    Prior to Admission medications   Medication Sig Start Date End Date Taking? Authorizing Provider  amLODipine (NORVASC) 10 MG tablet Take 1 tablet (10 mg total) by mouth daily. 10/28/17   Sherren Mocha, MD  aspirin 81 MG tablet Take 81 mg by mouth daily.      [provider]  atorvastatin (LIPITOR) 10 MG tablet Take  1 tablet (10 mg total) by mouth every Monday, Wednesday, and Friday. 10/28/17   Sherren Mocha, MD  benazepril (LOTENSIN) 40 MG tablet Take 1 tablet (40 mg total) by mouth daily. Please keep upcoming appt in October for future refills. Thank you 11/04/17   Sherren Mocha, MD  cephALEXin (KEFLEX) 500 MG capsule Take 1 capsule (500 mg total) 4 (four) times daily by mouth. 11/16/16   Davonna Belling, MD  Coenzyme Q10 200 MG TABS Take 1 tablet by mouth daily.    [provider]  ezetimibe (ZETIA) 10 MG tablet Take 1 tablet (10 mg total) by mouth daily. 10/28/17   Sherren Mocha, MD  glucosamine-chondroitin 500-400 MG tablet Take 1 tablet by mouth daily.     [provider]  metoprolol tartrate (LOPRESSOR) 25 MG tablet Take 1 tablet (25 mg total) by mouth 2 (two) times daily. 10/28/17   Sherren Mocha, MD  Multiple Vitamins-Minerals (CENTRUM SILVER PO) Take 1 tablet by mouth daily.     [provider]  OMEGA-3 FATTY ACIDS PO Take 1,200 mg by mouth 2 (two) times daily.    [provider]  oxyCODONE (OXY IR/ROXICODONE) 5 MG immediate release tablet Take 1 tablet (5 mg total) every 3 (three) hours as needed by mouth for moderate pain ((score 4 to 6)). 11/20/16   Cline Crock, PA-C    Family History Family History  Problem Relation Age of Onset  . Heart disease Mother   . Cancer Mother   . Heart disease Father   . Heart attack Father   . Coronary artery disease Other     Social History Social History   Tobacco Use  . Smoking status: Former Research scientist (life sciences)  . Smokeless tobacco: Never Used  . Tobacco comment: stopped in the 1950's  Substance Use Topics  . Alcohol use: Yes    Alcohol/week: 0.0 standard drinks    Comment: occ  . Drug use: No     Allergies   Statins   Review of Systems Review of Systems  All other systems reviewed and are negative.    Physical Exam Updated Vital Signs BP (!) 150/66 (BP Location: Left Arm)   Pulse 82   Temp 98.6 F (37 C) (Oral)   Resp 18   SpO2 99%   Physical Exam Vitals signs and nursing note reviewed.  Constitutional:      General: He is not in acute distress.    Appearance: He is well-developed.  HENT:     Head: Normocephalic and atraumatic.  Eyes:     Conjunctiva/sclera: Conjunctivae normal.     Pupils: Pupils are equal, round, and reactive to light.  Neck:     Musculoskeletal: Normal range of motion and neck supple.  Cardiovascular:     Rate and Rhythm: Normal rate and regular rhythm.     Heart sounds: No murmur.  Pulmonary:     Effort: Pulmonary effort is normal. No respiratory distress.     Breath sounds: Normal breath sounds. No wheezing or rales.   Abdominal:     General: There is no distension.     Palpations: Abdomen is soft.     Tenderness: There is no abdominal tenderness. There is no guarding or rebound.  Musculoskeletal: Normal range of motion.        General: Tenderness present.     Right shoulder: Normal.     Right hip: He exhibits tenderness. He exhibits normal range of motion, normal strength and no deformity.  Right knee: He exhibits swelling. He exhibits normal range of motion. No tenderness found.       Arms:     Right lower leg: Edema present.       Legs:       Feet:  Skin:    General: Skin is warm and dry.     Capillary Refill: Capillary refill takes less than 2 seconds.     Findings: No erythema or rash.  Neurological:     Mental Status: He is alert and oriented to person, place, and time. Mental status is at baseline.  Psychiatric:        Mood and Affect: Mood normal.        Behavior: Behavior normal.        Thought Content: Thought content normal.        Judgment: Judgment normal.      ED Treatments / Results  Labs (all labs ordered are listed, but only abnormal results are displayed) Labs Reviewed - No data to display  EKG None  Radiology US Venous Img Lower Right (dvt Study)  Result Date: 05/03/2018 CLINICAL DATA:  85 year old who fell 5 days ago and has persistent RIGHT knee and RIGHT LOWER leg pain and RIGHT ankle swelling. Patient previously had the RIGHT greater saphenous vein harvested for CABG. Initial encounter. EXAM: RIGHT LOWER EXTREMITY VENOUS DOPPLER ULTRASOUND TECHNIQUE: Gray-scale sonography with graded compression, as well as color Doppler and duplex ultrasound were performed to evaluate the lower extremity deep venous systems from the level of the common femoral vein and including the common femoral, femoral, profunda femoral, popliteal and calf veins including the posterior tibial, peroneal and gastrocnemius veins when visible. The superficial great saphenous vein was also  interrogated. Spectral Doppler was utilized to evaluate flow at rest and with distal augmentation maneuvers in the common femoral, femoral and popliteal veins. COMPARISON:  None. FINDINGS: Contralateral LEFT Common Femoral Vein: Respiratory phasicity is normal and symmetric with the symptomatic side. No evidence of thrombus. Normal compressibility. RIGHT Common Femoral Vein: No evidence of thrombus. Normal compressibility, respiratory phasicity and response to augmentation. Saphenofemoral Junction: No evidence of thrombus. Normal compressibility and flow on color Doppler imaging. Profunda Femoral Vein: No evidence of thrombus. Normal compressibility and flow on color Doppler imaging. Femoral Vein: No evidence of thrombus. Normal compressibility, respiratory phasicity and response to augmentation. Popliteal Vein: No evidence of thrombus. Normal compressibility, respiratory phasicity and response to augmentation. Calf Veins: No evidence of thrombus. Normal compressibility and flow on color Doppler imaging. Superficial Great Saphenous Vein: No evidence of thrombus. Normal compressibility. Venous Reflux:  Not evaluated. Other Findings: Approximate 6 x 4 x 4 cm Baker's cyst in the LEFT popliteal fossa. IMPRESSION: 1. No evidence of RIGHT LOWER extremity DVT. 2. Baker's cyst in the LEFT popliteal fossa. Electronically Signed   By: Evangeline Dakin M.D.   On: 05/03/2018 14:00   Dg Knee Complete 4 Views Right  Result Date: 05/03/2018 CLINICAL DATA:  85 year old who fell 5 days ago and has persistent RIGHT knee and RIGHT LOWER leg pain. Initial encounter. EXAM: RIGHT KNEE - COMPLETE 4+ VIEW COMPARISON:  None. FINDINGS: No evidence of acute or subacute fracture. Extensive chondrocalcinosis involving the LATERAL and MEDIAL menisci. Moderate tricompartment joint space narrowing. Calcification involving the POSTERIOR capsule. Enthesopathic spur at the insertion of the quadriceps tendon on the SUPERIOR patella. Moderate-sized  joint effusion. IMPRESSION: 1. No acute or subacute osseous abnormality. 2. 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 3. Moderate-sized joint effusion. Electronically Signed  By: Evangeline Dakin M.D.   On: 05/03/2018 13:56   Dg Foot Complete Right  Result Date: 05/03/2018 CLINICAL DATA:  85 year old who fell 5 days ago and has persistent pain involving the RIGHT foot. Initial encounter. EXAM: RIGHT FOOT COMPLETE - 3+ VIEW COMPARISON:  None. FINDINGS: No evidence of acute or subacute fracture or dislocation. Cortical thickening involving the shaft of the fourth metatarsal may indicate a remote stress injury with healing. Severe narrowing of the first MTP joint. Remaining joint spaces well preserved for patient age. Bone mineral density well-preserved. Small plantar calcaneal spur. Calcification within the plantar fascia. IMPRESSION: 1. No acute or subacute osseous abnormality. 2. Severe osteoarthritis involving the first MTP joint. Remaining joint spaces well-preserved. 3. Small plantar calcaneal spur. Electronically Signed   By: Evangeline Dakin M.D.   On: 05/03/2018 13:55   Dg Hip Unilat W Or Wo Pelvis 2-3 Views Right  Result Date: 05/03/2018 CLINICAL DATA:  Fall EXAM: DG HIP (WITH OR WITHOUT PELVIS) 2-3V RIGHT COMPARISON:  None. FINDINGS: No acute fracture. No dislocation. Moderate degenerative changes of the hip joints and lower lumbar spine. Unremarkable soft tissues other than vascular calcifications. Calcific density projects adjacent to the right ischium of unknown significance. It has a chronic appearance. IMPRESSION: No acute bony pathology. Electronically Signed   By: Marybelle Killings M.D.   On: 05/03/2018 13:53    Procedures Procedures (including critical care time)  Medications Ordered in ED Medications - No data to display   Initial Impression / Assessment and Plan / ED Course  I have reviewed the triage vital signs and the nursing notes.  Pertinent labs & imaging  results that were available during my care of the patient were reviewed by me and considered in my medical decision making (see chart for details).       Patient with a fall 5 to 6 days ago who had been improving but yesterday started having significant pain in the arch of his right foot and foot swelling.  He has had no other injuries since the fall but did not notice these issues initially.  Patient states the first 2 days he was fairly immobile but he has been getting up more.  He does have a prior history of DVT but is not on any anticoagulation.  Currently he is hemodynamically stable and has normal capillary refill and pulse in the foot.  He does not have MTP joint tenderness or ankle tenderness with low suspicion that this is gout.  Concern for possible DVT versus underlying fracture that he did not notice initially.  Also could be sprain or overuse.  Plantar fasciitis is also possible but the amount of swelling seems unusual.  Achilles the tendon is intact. X-ray and ultrasound pending.  Patient states he is also been having pain in his hip from where he fell but he is able to ambulate.  Plain film pending  2:15 PM [place] neg for DVT but does show baker's cyst on the left (not affected leg). Right foot with severe osteoarthritis of 1st MTP and plantar calcaneal spur and on right knee has tricomp arthritis and effusion.  Right hip wnl.  Suspect that patient has sprained his foot as well but just did not notice it after his fall because of his knee pain.  Also could be plantar fasciitis.  Patient will take Tylenol as needed and given Voltaren gel.  Area was wrapped and he was instructed to elevate it as often as he could.  Given follow-up  with sports medicine in 1 week if symptoms do not improve.   Final Clinical Impressions(s) / ED Diagnoses   Final diagnoses:  Sprain of right knee, unspecified ligament, initial encounter  Sprain of right foot, initial encounter    ED Discharge Orders          Ordered    diclofenac sodium (VOLTAREN) 1 % GEL  4 times daily     05/03/18 1413           Blanchie Dessert, MD 05/03/18 1417

## 2018-05-03 NOTE — ED Triage Notes (Signed)
Pt tripped and fell 5 days ago. C/o R leg pain, back pain, foot pain.

## 2018-05-05 ENCOUNTER — Telehealth: Payer: Self-pay | Admitting: Cardiovascular Disease

## 2018-05-05 NOTE — Telephone Encounter (Signed)
Luke [name] states he fell and sprained his right knee and ankle around 4/20. He did not pass out - he stated his leg "gave out" and he fell on the cement. He proceed to [facility] ED 4/25 where sprains were confirmed. They instructed him to take Tylenol and gave him Voltaren gel to use.  He specifically denies CP, SOB, dizziness. He does state his right arm "goes to sleep" about once a daily for the past 3-4 months and he is a little worried for circulation compromise.   He understands he will be called with Dr. Antionette Char recommendations regarding Voltaren and circulation.

## 2018-05-05 NOTE — Telephone Encounter (Signed)
New message:    Pt had to [facility] ER on Saturday. He was given a prescription for [employer]. Please call, he have some questions and concerns abous taking it.

## 2018-05-05 NOTE — Telephone Encounter (Signed)
Luke [name] would you mind calling and seeing what the issues are? thanks

## 2018-05-07 NOTE — Telephone Encounter (Signed)
Ok to use Voltaren gel, it will be less absorbed systemically than oral NSAIDs.

## 2018-05-07 NOTE — Telephone Encounter (Signed)
Doesn't sound like a circulatory problem. This would be very unusual to occur in the arm. More likely to be related to a nerve issue/arthritis in the neck can do this. Would be happy to see him once Covid restrictions are lifted, but for now would just keep an eye on things.

## 2018-05-07 NOTE — Telephone Encounter (Signed)
Informed patient that Dr. Burt Knack does not think his arm problems are a circulation issue. The patient understands to monitor symptoms and that Dr. Burt Knack is happy to see him when COVID restrictions are lifted.  He understands it is OK to use Voltaren gel.  He was grateful for call and agrees with treatment plan.

## 2018-06-23 ENCOUNTER — Other Ambulatory Visit: Payer: Self-pay

## 2018-06-23 ENCOUNTER — Emergency Department (HOSPITAL_BASED_OUTPATIENT_CLINIC_OR_DEPARTMENT_OTHER)
Admission: EM | Admit: 2018-06-23 | Discharge: 2018-06-23 | Disposition: A | Discharge status: Home / Self Care | Payer: Medicare Other | Attending: Emergency Medicine | Admitting: Emergency Medicine

## 2018-06-23 ENCOUNTER — Encounter (HOSPITAL_BASED_OUTPATIENT_CLINIC_OR_DEPARTMENT_OTHER): Payer: Self-pay | Admitting: *Deleted

## 2018-06-23 DIAGNOSIS — I10 Essential (primary) hypertension: Secondary | ICD-10-CM | POA: Diagnosis not present

## 2018-06-23 DIAGNOSIS — M25474 Effusion, right foot: Secondary | ICD-10-CM | POA: Diagnosis not present

## 2018-06-23 DIAGNOSIS — Z7982 Long term (current) use of aspirin: Secondary | ICD-10-CM | POA: Diagnosis not present

## 2018-06-23 DIAGNOSIS — Z951 Presence of aortocoronary bypass graft: Secondary | ICD-10-CM | POA: Insufficient documentation

## 2018-06-23 DIAGNOSIS — Z79899 Other long term (current) drug therapy: Secondary | ICD-10-CM | POA: Insufficient documentation

## 2018-06-23 DIAGNOSIS — I2510 Atherosclerotic heart disease of native coronary artery without angina pectoris: Secondary | ICD-10-CM | POA: Insufficient documentation

## 2018-06-23 DIAGNOSIS — Z87891 Personal history of nicotine dependence: Secondary | ICD-10-CM | POA: Insufficient documentation

## 2018-06-23 DIAGNOSIS — M79674 Pain in right toe(s): Secondary | ICD-10-CM | POA: Diagnosis present

## 2018-06-23 DIAGNOSIS — M7989 Other specified soft tissue disorders: Secondary | ICD-10-CM | POA: Diagnosis not present

## 2018-06-23 MED ORDER — KETOROLAC TROMETHAMINE 60 MG/2ML IM SOLN
30.0000 mg | Freq: Once | INTRAMUSCULAR | Status: AC
  Administered 2018-06-23: 30 mg via INTRAMUSCULAR
  Filled 2018-06-23: qty 2

## 2018-06-23 MED ORDER — HYDROCODONE-ACETAMINOPHEN 5-325 MG PO TABS: 1.0000 | ORAL_TABLET | Freq: Three times a day (TID) | ORAL | 0 refills | Status: DC | PRN

## 2018-06-23 MED ORDER — NAPROXEN 500 MG PO TABS: 500.0000 mg | ORAL_TABLET | Freq: Two times a day (BID) | ORAL | 0 refills | Status: AC

## 2018-06-23 MED ORDER — HYDROCODONE-ACETAMINOPHEN 5-325 MG PO TABS: 1.0000 | ORAL_TABLET | Freq: Three times a day (TID) | ORAL | 0 refills | Status: AC | PRN

## 2018-06-23 MED ORDER — METHYLPREDNISOLONE 4 MG PO TBPK: ORAL_TABLET | ORAL | 0 refills | Status: DC

## 2018-06-23 NOTE — ED Triage Notes (Addendum)
Pt c/o right foot swelling and redness that started a few days ago. Denies any fevers. States pain to right foot. States hx of cellulitis to right foot a couple years ago. No other complaints. Has taken tylenol last pm. Denies any injury.

## 2018-06-23 NOTE — ED Provider Notes (Addendum)
[facility] Provider Note  CSN: [id number] Arrival date & time: 06/23/18 0041  Chief Complaint(s) right foot swelling/redness  HPI Luke [name] is a 85 y.o. male who presents with 2 days of gradually worsening right first MTP joint pain described as throbbing.  Initially mild but gradually worsened to severe.  Exacerbated with ambulation and range of motion of the right MTP.  He denies any trauma.  No fevers or chills.  No recent infections.  No known history of gout.  HPI  Past Medical History Past Medical History:  Diagnosis Date  . Atrial fibrillation ([place])   . CAD (coronary artery disease)     5 with the left internal mamary to the left anterior  descending coronary artery.  [name] Kitchen DVT femoral (deep venous thrombosis) with thrombophlebitis ([place])   . Dyslipidemia   . HTN (hypertension)    Patient Active Problem List   Diagnosis Date Noted  . Open wound of right hand with tendon involvement 11/18/2016  . Chest pain 07/18/2016  . Abnormal EKG   . Hx of CABG   . Coronary artery disease involving native coronary artery of native heart with unstable angina pectoris ([place])   . Unstable angina ([place])   . Dyslipidemia, goal LDL below 70 04/16/2008  . Essential hypertension 04/16/2008  . CAD, ARTERY BYPASS GRAFT 04/16/2008  . DEEP VENOUS THROMBOPHLEBITIS, HX OF 04/16/2008  . ATRIAL FIBRILLATION, HX OF 04/16/2008   Home Medication(s) Prior to Admission medications   Medication Sig Start Date End Date Taking? Authorizing Provider  amLODipine (NORVASC) 10 MG tablet Take 1 tablet (10 mg total) by mouth daily. 10/28/17   Sherren Mocha, MD  aspirin 81 MG tablet Take 81 mg by mouth daily.      [provider]  atorvastatin (LIPITOR) 10 MG tablet Take 1 tablet (10 mg total) by mouth every Monday, Wednesday, and Friday. 10/28/17   Sherren Mocha, MD  benazepril (LOTENSIN) 40 MG tablet Take 1 tablet (40 mg total) by mouth daily. Please keep upcoming  appt in October for future refills. Thank you 11/04/17   Sherren Mocha, MD  cephALEXin (KEFLEX) 500 MG capsule Take 1 capsule (500 mg total) 4 (four) times daily by mouth. 11/16/16   Davonna Belling, MD  Coenzyme Q10 200 MG TABS Take 1 tablet by mouth daily.    [provider]  diclofenac sodium (VOLTAREN) 1 % GEL Apply 2 g topically 4 (four) times daily. 05/03/18   Blanchie Dessert, MD  ezetimibe (ZETIA) 10 MG tablet Take 1 tablet (10 mg total) by mouth daily. 10/28/17   Sherren Mocha, MD  glucosamine-chondroitin 500-400 MG tablet Take 1 tablet by mouth daily.     [provider]  HYDROcodone-acetaminophen (NORCO/VICODIN) 5-325 MG tablet Take 1 tablet by mouth every 8 (eight) hours as needed for up to 5 days for severe pain (That is not improved by your scheduled Naprosyn regimen). Please do not exceed 4000 mg of acetaminophen (Tylenol) a 24-hour period. Please note that he may be prescribed additional medicine that contains acetaminophen. 06/23/18 06/28/18  Fatima Blank, MD  methylPREDNISolone (MEDROL DOSEPAK) 4 MG TBPK tablet Use as directed on the package 06/23/18   Evia Goldsmith, Grayce Sessions, MD  metoprolol tartrate (LOPRESSOR) 25 MG tablet Take 1 tablet (25 mg total) by mouth 2 (two) times daily. 10/28/17   Sherren Mocha, MD  Multiple Vitamins-Minerals (CENTRUM SILVER PO) Take 1 tablet by mouth daily.     [provider]  naproxen (NAPROSYN) 500  MG tablet Take 1 tablet (500 mg total) by mouth 2 (two) times daily for 7 days. 06/23/18 06/30/18  Fatima Blank, MD  OMEGA-3 FATTY ACIDS PO Take 1,200 mg by mouth 2 (two) times daily.    [provider]                                                                                                                                    Past Surgical History Past Surgical History:  Procedure Laterality Date  . CORONARY ARTERY BYPASS GRAFT  2001  . I&D EXTREMITY Right 11/18/2016   Procedure: IRRIGATION  AND DEBRIDEMENT RIGHT LONG AND RING FINGER;  Surgeon: Netta Cedars, MD;  Location: [facility];  Service: Orthopedics;  Laterality: Right;  . LEFT HEART CATH AND CORS/GRAFTS ANGIOGRAPHY N/A 07/19/2016   Procedure: Left Heart Cath and Cors/Grafts Angiography;  Surgeon: Jettie Booze, MD;  Location: [facility];  Service: Cardiovascular;  Laterality: N/A;   Family History Family History  Problem Relation Age of Onset  . Heart disease Mother   . Cancer Mother   . Heart disease Father   . Heart attack Father   . Coronary artery disease Other     Social History Social History   Tobacco Use  . Smoking status: Former Research scientist (life sciences)  . Smokeless tobacco: Never Used  . Tobacco comment: stopped in the 1950's  Substance Use Topics  . Alcohol use: Yes    Alcohol/week: 0.0 standard drinks    Comment: occ  . Drug use: No   Allergies Statins  Review of Systems Review of Systems All other systems are reviewed and are negative for acute change except as noted in the HPI  Physical Exam Vital Signs  I have reviewed the triage vital signs BP (!) 175/76 (BP Location: Right Arm)   Pulse 76   Temp 97.7 F (36.5 C) (Oral)   Resp 20   Ht 5\' 9"  (1.753 m)   Wt 90.7 kg   SpO2 98%   BMI 29.53 kg/m   Physical Exam Vitals signs reviewed.  Constitutional:      General: He is not in acute distress.    Appearance: He is well-developed. He is not diaphoretic.  HENT:     Head: Normocephalic and atraumatic.     Jaw: No trismus.     Right Ear: External ear normal.     Left Ear: External ear normal.     Nose: Nose normal.  Eyes:     General: No scleral icterus.    Conjunctiva/sclera: Conjunctivae normal.  Neck:     Musculoskeletal: Normal range of motion.     Trachea: Phonation normal.  Cardiovascular:     Rate and Rhythm: Normal rate and regular rhythm.  Pulmonary:     Effort: Pulmonary effort is normal. No respiratory distress.     Breath sounds: No stridor.  Abdominal:     General:  There  is no distension.  Musculoskeletal:     Right foot: Decreased range of motion. Tenderness and swelling present.       Feet:     Comments: Mild hyperemia to right 1st MTP with TTP and with ROM. Mild swelling. No pain with light tactile.  Neurological:     Mental Status: He is alert and oriented to person, place, and time.  Psychiatric:        Behavior: Behavior normal.     ED Results and Treatments Labs (all labs ordered are listed, but only abnormal results are displayed) Labs Reviewed - No data to display                                                                                                                       EKG  EKG Interpretation  Date/Time:    Ventricular Rate:    PR Interval:    QRS Duration:   QT Interval:    QTC Calculation:   R Axis:     Text Interpretation:        Radiology No results found. Pertinent labs & imaging results that were available during my care of the patient were reviewed by me and considered in my medical decision making (see chart for details).  Medications Ordered in ED Medications  ketorolac (TORADOL) injection 30 mg (30 mg Intramuscular Given 06/23/18 0122)                                                                                                                                    Procedures Procedures  (including critical care time)  Medical Decision Making / ED Course I have reviewed the nursing notes for this encounter and the patient's prior records (if available in EHR or on provided paperwork).    Most suspicious for gout arthritis. No prior history of it and he is not on diuretics. He did report having intermittent joint pain through various joints that eventually resolve. Unlikely septic arthritis. Doubt cellulitis or DVT.  Given IM toradol for pain. Will Rx NSAIDs and short course of Norco.   The patient appears reasonably screened and/or stabilized for discharge and I doubt any other medical condition or  other [facility] requiring further screening, evaluation, or treatment in the ED at this time prior to discharge.  The patient is safe for discharge with strict return precautions.    Final Clinical Impression(s) / ED Diagnoses Final  diagnoses:  Swelling of first metatarsophalangeal (MTP) joint of right foot   Disposition: Discharge  Condition: Good  I have discussed the results, Dx and Tx plan with the patient who expressed understanding and agree(s) with the plan. Discharge instructions discussed at great length. The patient was given strict return precautions who verbalized understanding of the instructions. No further questions at time of discharge.    ED Discharge Orders         Ordered    naproxen (NAPROSYN) 500 MG tablet  2 times daily     06/23/18 0142    HYDROcodone-acetaminophen (NORCO/VICODIN) 5-325 MG tablet  Every 8 hours PRN,   Status:  Discontinued     06/23/18 0142    HYDROcodone-acetaminophen (NORCO/VICODIN) 5-325 MG tablet  Every 8 hours PRN     06/23/18 0148    methylPREDNISolone (MEDROL DOSEPAK) 4 MG TBPK tablet     06/23/18 0149         [place] narcotic database reviewed and no active prescriptions noted.  Follow Up: Primary care provider  Schedule an appointment as soon as possible for a visit  in 5-7 days       This chart was dictated using voice recognition software.  Despite best efforts to proofread,  errors can occur which can change the documentation meaning.     Fatima Blank, MD 06/23/18 [phone]

## 2018-07-02 ENCOUNTER — Other Ambulatory Visit: Payer: Self-pay

## 2018-07-02 ENCOUNTER — Encounter: Payer: Self-pay | Admitting: Family Medicine

## 2018-07-02 ENCOUNTER — Ambulatory Visit (INDEPENDENT_AMBULATORY_CARE_PROVIDER_SITE_OTHER): Payer: Medicare Other | Admitting: Family Medicine

## 2018-07-02 VITALS — BP 142/62 | HR 65 | Temp 98.0°F | Ht 69.0 in | Wt 201.0 lb

## 2018-07-02 DIAGNOSIS — M545 Low back pain, unspecified: Secondary | ICD-10-CM

## 2018-07-02 DIAGNOSIS — M1A9XX0 Chronic gout, unspecified, without tophus (tophi): Secondary | ICD-10-CM

## 2018-07-02 DIAGNOSIS — G8929 Other chronic pain: Secondary | ICD-10-CM

## 2018-07-02 LAB — BASIC METABOLIC PANEL
BUN: 25 mg/dL — ABNORMAL HIGH (ref 6–23)
CO2: 28 mEq/L (ref 19–32)
Calcium: 9 mg/dL (ref 8.4–10.5)
Chloride: 106 mEq/L (ref 96–112)
Creatinine, Ser: 1.14 mg/dL (ref 0.40–1.50)
GFR: 60.91 mL/min (ref >60.00)
Glucose, Bld: 91 mg/dL (ref 70–99)
Potassium: 4.7 mEq/L (ref 3.5–5.1)
Sodium: 141 mEq/L (ref 135–145)

## 2018-07-02 LAB — URIC ACID: Uric Acid, Serum: 7.8 mg/dL (ref 4.0–7.8)

## 2018-07-02 NOTE — Patient Instructions (Addendum)
For your back: Try Krill oil.   Heat (pad or rice pillow in microwave) over affected area, 10-15 minutes twice daily.   Foods to AVOID: Red meat, organ meat (liver), lunch meat, seafood (mussels, scallops, anchovies, etc) Alcohol Sugary foods/beverages (diet soft drinks have no link to flares)  Foods to migrate to: Dairy Vegetables Cherries have limited data to suggest they help lower uric acid levels (and prevent flares) Vit C (500 mg daily) may have a modest effect with preventing flares Poultry If you are going to eat red meat, beef and pork may give you less problems than lamb.  Give [place] 2-3 business days to get the results of your labs back.   Let us know if you need anything.

## 2018-07-02 NOTE — Progress Notes (Signed)
Chief Complaint  Patient presents with  . New Patient (Initial Visit)  . Gout       New Patient Visit SUBJECTIVE: HPI: Luke [name] is an 85 y.o.male who is being seen for establishing care.  The patient has not had PCP in many years.  Hx of gout, recently had a flare. Improved with steroid. Not on daily med. No recent urate levels. No current pain, redness or swelling.  Hx of chronic low back pain. 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 Has tried Voltaren gel, PT, chiropractic, various PO meds without relief. No recent inj or change in activity. Wondering what he can do at home to help. Not interested in more medicine.   Allergies  Allergen Reactions  . Statins Other (See Comments)    REACTION: Reaction not known    Past Medical History:  Diagnosis Date  . Atrial fibrillation ([place])   . CAD (coronary artery disease)     5 with the left internal mamary to the left anterior  descending coronary artery.  [name] Kitchen DVT femoral (deep venous thrombosis) with thrombophlebitis ([place])   . Dyslipidemia   . HTN (hypertension)    Past Surgical History:  Procedure Laterality Date  . CORONARY ARTERY BYPASS GRAFT  2001  . I&D EXTREMITY Right 11/18/2016   Procedure: IRRIGATION AND DEBRIDEMENT RIGHT LONG AND RING FINGER;  Surgeon: Netta Cedars, MD;  Location: [facility];  Service: Orthopedics;  Laterality: Right;  . LEFT HEART CATH AND CORS/GRAFTS ANGIOGRAPHY N/A 07/19/2016   Procedure: Left Heart Cath and Cors/Grafts Angiography;  Surgeon: Jettie Booze, MD;  Location: [facility];  Service: Cardiovascular;  Laterality: N/A;   Family History  Problem Relation Age of Onset  . Heart disease Mother   . Cancer Mother   . Heart disease Father   . Heart attack Father   . Coronary artery disease Other    Allergies  Allergen Reactions  . Statins Other (See Comments)    REACTION: Reaction not known    Current Outpatient Medications:  .  amLODipine (NORVASC) 10 MG tablet, Take 1  tablet (10 mg total) by mouth daily., Disp: 90 tablet, Rfl: 3 .  aspirin 81 MG tablet, Take 81 mg by mouth daily.  , Disp: , Rfl:  .  atorvastatin (LIPITOR) 10 MG tablet, Take 1 tablet (10 mg total) by mouth every Monday, Wednesday, and Friday., Disp: 45 tablet, Rfl: 3 .  benazepril (LOTENSIN) 40 MG tablet, Take 1 tablet (40 mg total) by mouth daily. Please keep upcoming appt in October for future refills. Thank you, Disp: 90 tablet, Rfl: 3 .  Coenzyme Q10 200 MG TABS, Take 1 tablet by mouth daily., Disp: , Rfl:  .  diclofenac sodium (VOLTAREN) 1 % GEL, Apply 2 g topically 4 (four) times daily., Disp: 100 g, Rfl: 2 .  ezetimibe (ZETIA) 10 MG tablet, Take 1 tablet (10 mg total) by mouth daily., Disp: 90 tablet, Rfl: 3 .  glucosamine-chondroitin 500-400 MG tablet, Take 1 tablet by mouth daily. , Disp: , Rfl:  .  metoprolol tartrate (LOPRESSOR) 25 MG tablet, Take 1 tablet (25 mg total) by mouth 2 (two) times daily., Disp: 180 tablet, Rfl: 3 .  Multiple Vitamins-Minerals (CENTRUM SILVER PO), Take 1 tablet by mouth daily. , Disp: , Rfl:  .  OMEGA-3 FATTY ACIDS PO, Take 1,200 mg by mouth 2 (two) times daily., Disp: , Rfl:   ROS MSK: +back pain  GU: Denies incontinence   OBJECTIVE:  BP (!) 142/62 (BP Location: Left Arm, Patient Position: Sitting, Cuff Size: Normal)   Pulse 65   Temp 98 F (36.7 C) (Oral)   Ht 5\' 9"  (1.753 m)   Wt 201 lb (91.2 kg)   SpO2 96%   BMI 29.68 kg/m   Constitutional: -  VS reviewed -  Well developed, well nourished, appears stated age -  No apparent distress  Psychiatric: -  Oriented to person, place, and time -  Memory intact -  Affect and mood normal -  Fluent conversation, good eye contact -  Judgment and insight age appropriate  Eye: -  Conjunctivae clear, no discharge -  Pupils symmetric, round, reactive to light  ENMT: -  MMM    Pharynx moist, no exudate, no erythema  Neck: -  No gross swelling, no palpable masses -  Thyroid midline, not enlarged,  mobile, no palpable masses  Cardiovascular: -  RRR -  No LE edema  Respiratory: -  Normal respiratory effort, no accessory muscle use, no retraction -  Breath sounds equal, no wheezes, no ronchi, no crackles  Musculoskeletal: -  No clubbing, no cyanosis -  Mild ttp over SI jt -  Mildly tight hamstrings b/l -  Neg straight leg -  Gait normal  Skin: -  No significant lesion on inspection -  Warm and dry to palpation   ASSESSMENT/PLAN: Chronic gout involving toe of right foot without tophus, unspecified cause - Plan: Basic metabolic panel, Uric acid, gout diet info provided.  Chronic bilateral low back pain without sciatica - Plan: try Krill oil. Will run by cardiologist if selective COX-2 inhibitor would be acceptable.   Patient should return in 1 yr if labs are normal, sooner if it is apparent he needs to start a PO med for gout. The patient voiced understanding and agreement to the plan.   [place], DO 07/02/18  11:23 AM

## 2018-07-25 ENCOUNTER — Other Ambulatory Visit: Payer: Self-pay | Admitting: Cardiovascular Disease

## 2018-09-17 ENCOUNTER — Telehealth: Payer: Self-pay | Admitting: Cardiovascular Disease

## 2018-09-17 ENCOUNTER — Other Ambulatory Visit: Payer: Self-pay

## 2018-09-17 ENCOUNTER — Other Ambulatory Visit: Payer: Self-pay | Admitting: Cardiovascular Disease

## 2018-09-17 MED ORDER — BENAZEPRIL HCL 40 MG PO TABS: 40.0000 mg | ORAL_TABLET | Freq: Every day | ORAL | 0 refills | Status: DC

## 2018-09-17 MED ORDER — EZETIMIBE 10 MG PO TABS: 10.0000 mg | ORAL_TABLET | Freq: Every day | ORAL | 0 refills | Status: DC

## 2018-09-17 MED ORDER — AMLODIPINE BESYLATE 10 MG PO TABS: 10.0000 mg | ORAL_TABLET | Freq: Every day | ORAL | 0 refills | Status: DC

## 2018-09-17 MED ORDER — ATORVASTATIN CALCIUM 10 MG PO TABS: ORAL_TABLET | ORAL | 0 refills | Status: DC

## 2018-09-17 MED ORDER — METOPROLOL TARTRATE 25 MG PO TABS: 25.0000 mg | ORAL_TABLET | Freq: Two times a day (BID) | ORAL | 0 refills | Status: DC

## 2018-09-17 NOTE — Telephone Encounter (Signed)
New Message    *STAT* If patient is at the pharmacy, call can be transferred to refill team.   1. Which medications need to be refilled? (please list name of each medication and dose if known) atorvastatin (LIPITOR) 10 MG tablet    2. Which pharmacy/location (including street and city if local pharmacy) is medication to be sent to? [id number] - [place], [place] - [address] AT [address]  3. Do they need a 30 day or 90 day supply? 30   Patient is out and he needs this sent to the local pharmacy while he waits on the mail order refill

## 2018-09-17 NOTE — Telephone Encounter (Signed)
New Message    *STAT* If patient is at the pharmacy, call can be transferred to refill team.   1. Which medications need to be refilled? (please list name of each medication and dose if known) amLODipine (NORVASC) 10 MG tablet, atorvastatin (LIPITOR) 10 MG tablet, benazepril (LOTENSIN) 40 MG tablet , ezetimibe (ZETIA) 10 MG tablet and metoprolol tartrate (LOPRESSOR) 25 MG tablet         2. Which pharmacy/location (including street and city if local pharmacy) is medication to be sent to? [place], [place]  3. Do they need a 30 day or 90 day supply? [place]

## 2018-09-28 NOTE — Progress Notes (Signed)
Cardiology Office Note    Date:  09/29/2018   ID:  Samori, Pacha 12-22-1930, MRN [id number]  PCP:  Shelda Pal, DO  Cardiologist:  Dr. Burt Knack  Chief Complaint: 12 Months follow up  History of Present Illness:   Luke [name] is a 85 y.o. male with hx of CAD s/p CABG, HTN and HLD seen for follow up.  Patient underwent remote CABG over 15 years ago.  His last stress test in 2017 demonstrated no ischemia and normal LV function.  The patient was hospitalized with chest pain in 2018 and underwent cardiac catheterization demonstrating patency of all of his bypass grafts.  Last seen by Dr. Burt Knack 10/2017.    Here today for follow up. He plays golf once every week.  1 of his close friends dealing with cancer.  He is under stress due to this.  He has intermittent muscle ache on low-dose Lipitor which is significantly better than prior high-dose statin.  He admits to eating high Salt.  Denies lower extremity edema but noted on exam.  No chest pain, shortness of breath, orthopnea, PND, syncope or melena.  Has dry cough in the morning.    Past Medical History:  Diagnosis Date  . Atrial fibrillation ([place])   . CAD (coronary artery disease)     5 with the left internal mamary to the left anterior  descending coronary artery.  [name] Kitchen DVT femoral (deep venous thrombosis) with thrombophlebitis ([place])   . Dyslipidemia   . HTN (hypertension)     Past Surgical History:  Procedure Laterality Date  . CORONARY ARTERY BYPASS GRAFT  2001  . I&D EXTREMITY Right 11/18/2016   Procedure: IRRIGATION AND DEBRIDEMENT RIGHT LONG AND RING FINGER;  Surgeon: Netta Cedars, MD;  Location: [facility];  Service: Orthopedics;  Laterality: Right;  . LEFT HEART CATH AND CORS/GRAFTS ANGIOGRAPHY N/A 07/19/2016   Procedure: Left Heart Cath and Cors/Grafts Angiography;  Surgeon: Jettie Booze, MD;  Location: [facility];  Service: Cardiovascular;  Laterality: N/A;    Current Medications: Prior  to Admission medications   Medication Sig Start Date End Date Taking? Authorizing Provider  amLODipine (NORVASC) 10 MG tablet Take 1 tablet (10 mg total) by mouth daily. 09/17/18   Sherren Mocha, MD  aspirin 81 MG tablet Take 81 mg by mouth daily.      [provider]  atorvastatin (LIPITOR) 10 MG tablet Take 1 tablet by mouth every Monday, Wednesday and Friday. 09/17/18   Sherren Mocha, MD  benazepril (LOTENSIN) 40 MG tablet Take 1 tablet (40 mg total) by mouth daily. 09/17/18   Sherren Mocha, MD  Coenzyme Q10 200 MG TABS Take 1 tablet by mouth daily.    [provider]  diclofenac sodium (VOLTAREN) 1 % GEL Apply 2 g topically 4 (four) times daily. 05/03/18   Blanchie Dessert, MD  ezetimibe (ZETIA) 10 MG tablet Take 1 tablet (10 mg total) by mouth daily. 09/17/18   Sherren Mocha, MD  glucosamine-chondroitin 500-400 MG tablet Take 1 tablet by mouth daily.     [provider]  metoprolol tartrate (LOPRESSOR) 25 MG tablet Take 1 tablet (25 mg total) by mouth 2 (two) times daily. 09/17/18   Sherren Mocha, MD  Multiple Vitamins-Minerals (CENTRUM SILVER PO) Take 1 tablet by mouth daily.     [provider]  OMEGA-3 FATTY ACIDS PO Take 1,200 mg by mouth 2 (two) times daily.    [provider]    Allergies:  Statins   Social History   Socioeconomic History  . Marital status: Widowed    Spouse name: Not on file  . Number of children: Not on file  . Years of education: Not on file  . Highest education level: Not on file  Occupational History  . Not on file  Social Needs  . Financial resource strain: Not on file  . Food insecurity    Worry: Not on file    Inability: Not on file  . Transportation needs    Medical: Not on file    Non-medical: Not on file  Tobacco Use  . Smoking status: Former Research scientist (life sciences)  . Smokeless tobacco: Never Used  . Tobacco comment: stopped in the 1950's  Substance and Sexual Activity  . Alcohol use: Yes    Alcohol/week:  0.0 standard drinks    Comment: occ  . Drug use: No  . Sexual activity: Not on file  Lifestyle  . Physical activity    Days per week: Not on file    Minutes per session: Not on file  . Stress: Not on file  Relationships  . Social Herbalist on phone: Not on file    Gets together: Not on file    Attends religious service: Not on file    Active member of club or organization: Not on file    Attends meetings of clubs or organizations: Not on file    Relationship status: Not on file  Other Topics Concern  . Not on file  Social History Narrative   He lives in [place] with his wife.  He does not smoke.He drives about two hours a day which increases his risk for DVT.  He is retired.  He is a Psychologist, occupational at [employer].            Family History:  The patient's family history includes Cancer in his mother; Coronary artery disease in an other family member; Heart attack in his father; Heart disease in his father and mother.   ROS:   Please see the history of present illness.    ROS All other systems reviewed and are negative.   PHYSICAL EXAM:   VS:  BP (!) 158/72   Pulse (!) 59   Ht 5\' 9"  (1.753 m)   Wt 208 lb 12.8 oz (94.7 kg)   SpO2 97%   BMI 30.83 kg/m    GEN: Well nourished, well developed, in no acute distress  HEENT: normal  Neck: no JVD, carotid bruits, or masses Cardiac: RRR; no murmurs, rubs, or gallops, 1+ bilateral lower extremity edema  Respiratory:  clear to auscultation bilaterally, normal work of breathing GI: soft, nontender, nondistended, + BS MS: no deformity or atrophy  Skin: warm and dry, no rash Neuro:  Alert and Oriented x 3, Strength and sensation are intact Psych: euthymic mood, full affect  Wt Readings from Last 3 Encounters:  09/29/18 208 lb 12.8 oz (94.7 kg)  07/02/18 201 lb (91.2 kg)  06/23/18 200 lb (90.7 kg)      Studies/Labs Reviewed:   EKG:  EKG is ordered today.  The ekg ordered today demonstrates sinus  bradycardia at rate of 59 bpm, PAC, nonspecific ST/T wave abnormality  Recent Labs: 10/28/2017: ALT 18 07/02/2018: BUN 25; Creatinine, Ser 1.14; Potassium 4.7; Sodium 141   Lipid Panel    Component Value Date/Time   CHOL 151 10/28/2017 0738   TRIG 120 10/28/2017 0738   TRIG 89 11/15/2005 0820  HDL 43 10/28/2017 0738   CHOLHDL 3.5 10/28/2017 0738   CHOLHDL 4.2 02/28/2015 1541   VLDL 23 02/28/2015 1541   LDLCALC 84 10/28/2017 0738   LDLDIRECT 139.9 06/18/2006 1008    Additional studies/ records that were reviewed today include:   Left Heart Cath and Cors/Grafts Angiography  Conclusion    Ost Cx to Prox Cx lesion, 90 %stenosed.  Mid Cx lesion, 90 %stenosed.  Ost 1st Mrg to 1st Mrg lesion, 90 %stenosed.  Patent jump graft SVG to OM1 and O2  Mid LAD lesion, 75 %stenosed. LIMA to LAD is widely patent.  Ost 1st Diag lesion, 100 %stenosed.  Ost 2nd Diag lesion, 75 %stenosed.  Patent jump graft SVG to first and second diagonals. Mild lesion in the proximal graft.  The left ventricular systolic function is normal.  LV end diastolic pressure is mildly elevated.  The left ventricular ejection fraction is 50-55% by visual estimate.  There is no aortic valve stenosis.  Would not use left radial approach in the future if cath was needed, due to tortuosity.   Continue aggressive secondary prevention.   ASSESSMENT & PLAN:    1. CAD s/p CABG No angina.  Aspirin, statin and beta-blocker.  2. HTN -Elevated.  Repeat check 146/70.  His systolic blood pressure runs in 140s at home.  He understands want to change any medication.  He will cut back on salt intake.  His dry cough could be due to benazepril.  He will give [place] a call if worsening cough.  3. HLD -Continue low-dose Lipitor and zetia.  History of statin intolerance.  4.  Bilateral lower extremity edema -Likely due to high salt intake.  Cannot rule out side effect of amlodipine.  He will work on his diet.       Medication Adjustments/Labs and Tests Ordered: Current medicines are reviewed at length with the patient today.  Concerns regarding medicines are outlined above.  Medication changes, Labs and Tests ordered today are listed in the Patient Instructions below. Patient Instructions  Medication Instructions:   Your physician recommends that you continue on your current medications as directed. Please refer to the Current Medication list given to you today.  If you need a refill on your cardiac medications before your next appointment, please call your pharmacy.   Lab work:  None ordered today  Testing/Procedures:  None ordered today  Follow-Up: At [facility], you and your health needs are our priority.  As part of our continuing mission to provide you with exceptional heart care, we have created designated Provider Care Teams.  These Care Teams include your primary Cardiologist (physician) and Advanced Practice Providers (APPs -  Physician Assistants and Nurse Practitioners) who all work together to provide you with the care you need, when you need it. You will need a follow up appointment in:  12 months.  Please call our office 2 months in advance to schedule this appointment.  You may see Sherren Mocha, MD or one of the following Advanced Practice Providers on your designated Care Team: Richardson Dopp, PA-C [place], [place] . Daune Perch, NP        Signed, Leanor Kail, [place]  09/29/2018 2:06 PM    [facility] [place], [place], [place]  [address] Phone: [phone]; Fax: [phone]

## 2018-09-29 ENCOUNTER — Other Ambulatory Visit: Payer: Self-pay

## 2018-09-29 ENCOUNTER — Encounter: Payer: Self-pay | Admitting: Physician Assistant

## 2018-09-29 ENCOUNTER — Encounter (INDEPENDENT_AMBULATORY_CARE_PROVIDER_SITE_OTHER): Payer: Self-pay

## 2018-09-29 ENCOUNTER — Ambulatory Visit (INDEPENDENT_AMBULATORY_CARE_PROVIDER_SITE_OTHER): Payer: Medicare Other | Admitting: Physician Assistant

## 2018-09-29 VITALS — BP 158/72 | HR 59 | Ht 69.0 in | Wt 208.8 lb

## 2018-09-29 DIAGNOSIS — Z951 Presence of aortocoronary bypass graft: Secondary | ICD-10-CM

## 2018-09-29 DIAGNOSIS — I25110 Atherosclerotic heart disease of native coronary artery with unstable angina pectoris: Secondary | ICD-10-CM | POA: Diagnosis not present

## 2018-09-29 DIAGNOSIS — R600 Localized edema: Secondary | ICD-10-CM

## 2018-09-29 DIAGNOSIS — E782 Mixed hyperlipidemia: Secondary | ICD-10-CM | POA: Diagnosis not present

## 2018-09-29 DIAGNOSIS — I10 Essential (primary) hypertension: Secondary | ICD-10-CM

## 2018-09-29 MED ORDER — AMLODIPINE BESYLATE 10 MG PO TABS: 10.0000 mg | ORAL_TABLET | Freq: Every day | ORAL | 3 refills | Status: DC

## 2018-09-29 MED ORDER — ATORVASTATIN CALCIUM 10 MG PO TABS: ORAL_TABLET | ORAL | 3 refills | Status: DC

## 2018-09-29 MED ORDER — METOPROLOL TARTRATE 25 MG PO TABS: 25.0000 mg | ORAL_TABLET | Freq: Two times a day (BID) | ORAL | 3 refills | Status: DC

## 2018-09-29 MED ORDER — BENAZEPRIL HCL 40 MG PO TABS: 40.0000 mg | ORAL_TABLET | Freq: Every day | ORAL | 3 refills | Status: DC

## 2018-09-29 MED ORDER — EZETIMIBE 10 MG PO TABS: 10.0000 mg | ORAL_TABLET | Freq: Every day | ORAL | 3 refills | Status: DC

## 2018-09-29 NOTE — Patient Instructions (Signed)
Medication Instructions:   Your physician recommends that you continue on your current medications as directed. Please refer to the Current Medication list given to you today.  If you need a refill on your cardiac medications before your next appointment, please call your pharmacy.   Lab work:  None ordered today  Testing/Procedures:  None ordered today  Follow-Up: At [facility], you and your health needs are our priority.  As part of our continuing mission to provide you with exceptional heart care, we have created designated Provider Care Teams.  These Care Teams include your primary Cardiologist (physician) and Advanced Practice Providers (APPs -  Physician Assistants and Nurse Practitioners) who all work together to provide you with the care you need, when you need it. You will need a follow up appointment in:  12 months.  Please call our office 2 months in advance to schedule this appointment.  You may see Sherren Mocha, MD or one of the following Advanced Practice Providers on your designated Care Team: Richardson Dopp, PA-C [place], [place] . Daune Perch, NP

## 2018-10-04 DIAGNOSIS — Z23 Encounter for immunization: Secondary | ICD-10-CM | POA: Diagnosis not present

## 2018-12-13 ENCOUNTER — Other Ambulatory Visit: Payer: Self-pay | Admitting: Cardiovascular Disease

## 2019-01-30 ENCOUNTER — Other Ambulatory Visit: Payer: Self-pay

## 2019-01-30 ENCOUNTER — Encounter (HOSPITAL_BASED_OUTPATIENT_CLINIC_OR_DEPARTMENT_OTHER): Payer: Self-pay | Admitting: *Deleted

## 2019-01-30 ENCOUNTER — Emergency Department (HOSPITAL_BASED_OUTPATIENT_CLINIC_OR_DEPARTMENT_OTHER)
Admission: EM | Admit: 2019-01-30 | Discharge: 2019-01-30 | Disposition: A | Discharge status: Home / Self Care | Payer: Medicare Other | Attending: Emergency Medicine | Admitting: Emergency Medicine

## 2019-01-30 ENCOUNTER — Emergency Department (HOSPITAL_BASED_OUTPATIENT_CLINIC_OR_DEPARTMENT_OTHER): Payer: Medicare Other

## 2019-01-30 DIAGNOSIS — Z888 Allergy status to other drugs, medicaments and biological substances status: Secondary | ICD-10-CM | POA: Diagnosis not present

## 2019-01-30 DIAGNOSIS — I82432 Acute embolism and thrombosis of left popliteal vein: Secondary | ICD-10-CM | POA: Diagnosis not present

## 2019-01-30 DIAGNOSIS — I4891 Unspecified atrial fibrillation: Secondary | ICD-10-CM | POA: Diagnosis not present

## 2019-01-30 DIAGNOSIS — I2510 Atherosclerotic heart disease of native coronary artery without angina pectoris: Secondary | ICD-10-CM | POA: Diagnosis not present

## 2019-01-30 DIAGNOSIS — Z7901 Long term (current) use of anticoagulants: Secondary | ICD-10-CM | POA: Diagnosis not present

## 2019-01-30 DIAGNOSIS — Z87891 Personal history of nicotine dependence: Secondary | ICD-10-CM | POA: Insufficient documentation

## 2019-01-30 DIAGNOSIS — I10 Essential (primary) hypertension: Secondary | ICD-10-CM | POA: Insufficient documentation

## 2019-01-30 DIAGNOSIS — Z79899 Other long term (current) drug therapy: Secondary | ICD-10-CM | POA: Diagnosis not present

## 2019-01-30 DIAGNOSIS — I82452 Acute embolism and thrombosis of left peroneal vein: Secondary | ICD-10-CM

## 2019-01-30 DIAGNOSIS — R2242 Localized swelling, mass and lump, left lower limb: Secondary | ICD-10-CM | POA: Diagnosis present

## 2019-01-30 DIAGNOSIS — I82412 Acute embolism and thrombosis of left femoral vein: Secondary | ICD-10-CM | POA: Insufficient documentation

## 2019-01-30 DIAGNOSIS — E785 Hyperlipidemia, unspecified: Secondary | ICD-10-CM | POA: Diagnosis not present

## 2019-01-30 LAB — CBC
HCT: 38.2 % — ABNORMAL LOW (ref 39.0–52.0)
Hemoglobin: 12.9 g/dL — ABNORMAL LOW (ref 13.0–17.0)
MCH: 33 pg (ref 26.0–34.0)
MCHC: 33.8 g/dL (ref 30.0–36.0)
MCV: 97.7 fL (ref 80.0–100.0)
Platelets: 144 10*3/uL — ABNORMAL LOW (ref 150–400)
RBC: 3.91 MIL/uL — ABNORMAL LOW (ref 4.22–5.81)
RDW: 12 % (ref 11.5–15.5)
WBC: 7.8 10*3/uL (ref 4.0–10.5)
nRBC: 0 % (ref 0.0–0.2)

## 2019-01-30 LAB — COMPREHENSIVE METABOLIC PANEL
ALT: 19 U/L (ref 0–44)
AST: 19 U/L (ref 15–41)
Albumin: 4 g/dL (ref 3.5–5.0)
Alkaline Phosphatase: 51 U/L (ref 38–126)
Anion gap: 9 (ref 5–15)
BUN: 28 mg/dL — ABNORMAL HIGH (ref 8–23)
CO2: 20 mmol/L — ABNORMAL LOW (ref 22–32)
Calcium: 9.2 mg/dL (ref 8.9–10.3)
Chloride: 111 mmol/L (ref 98–111)
Creatinine, Ser: 1.23 mg/dL (ref 0.61–1.24)
GFR calc Af Amer: >60 mL/min (ref >60)
GFR calc non Af Amer: 53 mL/min — ABNORMAL LOW (ref >60)
Glucose, Bld: 111 mg/dL — ABNORMAL HIGH (ref 70–99)
Potassium: 4.3 mmol/L (ref 3.5–5.1)
Sodium: 140 mmol/L (ref 135–145)
Total Bilirubin: 0.5 mg/dL (ref 0.3–1.2)
Total Protein: 7.2 g/dL (ref 6.5–8.1)

## 2019-01-30 LAB — PROTIME-INR
INR: 1 (ref 0.8–1.2)
Prothrombin Time: 13 seconds (ref 11.4–15.2)

## 2019-01-30 MED ORDER — APIXABAN 2.5 MG PO TABS
10.0000 mg | ORAL_TABLET | Freq: Two times a day (BID) | ORAL | Status: DC
  Administered 2019-01-30: 10 mg via ORAL
  Filled 2019-01-30: qty 4

## 2019-01-30 MED ORDER — APIXABAN 5 MG PO TABS: ORAL_TABLET | ORAL | 0 refills | Status: DC

## 2019-01-30 NOTE — ED Triage Notes (Signed)
Left leg swelling x 4 days. Red. No pain.

## 2019-01-30 NOTE — ED Notes (Signed)
Pt c/o left leg swelling x 4 days  Wants to be checked for diabetic  States he takes a lot of fish oil and read that that could cause it  Denies pain

## 2019-01-30 NOTE — ED Provider Notes (Signed)
[facility] Provider Note   CSN: [id number] Arrival date & time: 01/30/19  1451     History Left leg swelling  Luke [name] is a 86 y.o. male with a history of DVT, not on anticoagulation, presented emergency department left leg swelling for a week.  He denies any falls or trauma.  He denies any pain in his leg.  He said he had noted to the left leg more swollen than normal.  He denies fevers or chills.  He denies any wounds to the legs.  Presented asking that he be checked for diabetes.  He said he was told that leg swelling could be related to diabetes.  He denies any new peripheral neuropathy.   HPI     Past Medical History:  Diagnosis Date  . Atrial fibrillation ([place])   . CAD (coronary artery disease)     5 with the left internal mamary to the left anterior  descending coronary artery.  [name] Kitchen DVT femoral (deep venous thrombosis) with thrombophlebitis ([place])   . Dyslipidemia   . HTN (hypertension)     Patient Active Problem List   Diagnosis Date Noted  . Open wound of right hand with tendon involvement 11/18/2016  . Chest pain 07/18/2016  . Abnormal EKG   . Hx of CABG   . Coronary artery disease involving native coronary artery of native heart with unstable angina pectoris ([place])   . Unstable angina ([place])   . Dyslipidemia, goal LDL below 70 04/16/2008  . Essential hypertension 04/16/2008  . CAD, ARTERY BYPASS GRAFT 04/16/2008  . DEEP VENOUS THROMBOPHLEBITIS, HX OF 04/16/2008  . ATRIAL FIBRILLATION, HX OF 04/16/2008    Past Surgical History:  Procedure Laterality Date  . CORONARY ARTERY BYPASS GRAFT  2001  . I & D EXTREMITY Right 11/18/2016   Procedure: IRRIGATION AND DEBRIDEMENT RIGHT LONG AND RING FINGER;  Surgeon: Netta Cedars, MD;  Location: [facility];  Service: Orthopedics;  Laterality: Right;  . LEFT HEART CATH AND CORS/GRAFTS ANGIOGRAPHY N/A 07/19/2016   Procedure: Left Heart Cath and Cors/Grafts Angiography;  Surgeon: Jettie Booze, MD;  Location: [facility];  Service: Cardiovascular;  Laterality: N/A;       Family History  Problem Relation Age of Onset  . Heart disease Mother   . Cancer Mother   . Heart disease Father   . Heart attack Father   . Coronary artery disease Other     Social History   Tobacco Use  . Smoking status: Former Research scientist (life sciences)  . Smokeless tobacco: Never Used  . Tobacco comment: stopped in the 1950's  Substance Use Topics  . Alcohol use: Yes    Alcohol/week: 0.0 standard drinks    Comment: occ  . Drug use: No    Home Medications Prior to Admission medications   Medication Sig Start Date End Date Taking? Authorizing Provider  amLODipine (NORVASC) 10 MG tablet Take 1 tablet (10 mg total) by mouth daily. 09/29/18   Leanor Kail, PA  apixaban (ELIQUIS) 5 MG TABS tablet Take 2 tablets (10mg ) twice daily for the first 7 days, then 1 tablet (5mg ) twice daily 01/30/19   Wyvonnia Dusky, MD  aspirin 81 MG tablet Take 81 mg by mouth daily.      [provider]  atorvastatin (LIPITOR) 10 MG tablet TAKE 1 TABLET BY MOUTH EVERY MONDAY, [facility] AND FRIDAY 12/15/18   Sherren Mocha, MD  benazepril (LOTENSIN) 40 MG tablet Take 1 tablet (40 mg total)  by mouth daily. 09/29/18   Bhagat, Crista Luria, PA  Coenzyme Q10 200 MG TABS Take 1 tablet by mouth daily.    [provider]  diclofenac sodium (VOLTAREN) 1 % GEL Apply 2 g topically 4 (four) times daily. 05/03/18   Blanchie Dessert, MD  ezetimibe (ZETIA) 10 MG tablet Take 1 tablet (10 mg total) by mouth daily. 09/29/18   Bhagat, Crista Luria, PA  glucosamine-chondroitin 500-400 MG tablet Take 1 tablet by mouth daily.     [provider]  Javier Docker Oil 500 MG CAPS Take 500 mg by mouth daily.    [provider]  metoprolol tartrate (LOPRESSOR) 25 MG tablet Take 1 tablet (25 mg total) by mouth 2 (two) times daily. 09/29/18   Bhagat, Crista Luria, PA  Multiple Vitamins-Minerals (CENTRUM SILVER PO) Take 1  tablet by mouth daily.     [provider]  OMEGA-3 FATTY ACIDS PO Take 1,200 mg by mouth 2 (two) times daily.    [provider]    Allergies    Statins  Review of Systems   Review of Systems  Constitutional: Negative for chills and fever.  Respiratory: Negative for cough and shortness of breath.   Cardiovascular: Positive for leg swelling. Negative for chest pain and palpitations.  Genitourinary: Negative for dysuria and hematuria.  Musculoskeletal: Negative for arthralgias and gait problem.  Skin: Negative for rash and wound.  Neurological: Negative for syncope and light-headedness.  Psychiatric/Behavioral: Negative for agitation and confusion.  All other systems reviewed and are negative.   Physical Exam Updated Vital Signs BP (!) 150/62   Pulse 76   Temp 99.8 F (37.7 C) (Oral)   Resp 20   Ht 5\' 9"  (1.753 m)   Wt 91.2 kg   SpO2 95%   BMI 29.68 kg/m   Physical Exam Vitals and nursing note reviewed.  Constitutional:      Appearance: He is well-developed.  HENT:     Head: Normocephalic and atraumatic.  Eyes:     Conjunctiva/sclera: Conjunctivae normal.  Cardiovascular:     Rate and Rhythm: Normal rate and regular rhythm.     Heart sounds: No murmur.  Pulmonary:     Effort: Pulmonary effort is normal. No respiratory distress.     Breath sounds: Normal breath sounds.  Musculoskeletal:     Cervical back: Neck supple.     Comments: Diffuse uniform swelling of the left lower extremity Extremity pink and well perfused No tenderness on lower extremity exam Full ROM of the joints  Skin:    General: Skin is warm and dry.  Neurological:     General: No focal deficit present.     Mental Status: He is alert and oriented to person, place, and time.     Sensory: No sensory deficit.     Gait: Gait normal.  Psychiatric:        Mood and Affect: Mood normal.        Behavior: Behavior normal.     ED Results / Procedures / Treatments   Labs (all  labs ordered are listed, but only abnormal results are displayed) Labs Reviewed  COMPREHENSIVE METABOLIC PANEL - Abnormal; Notable for the following components:      Result Value   CO2 20 (*)    Glucose, Bld 111 (*)    BUN 28 (*)    GFR calc non Af Amer 53 (*)    All other components within normal limits  CBC - Abnormal; Notable for the following components:  RBC 3.91 (*)    Hemoglobin 12.9 (*)    HCT 38.2 (*)    Platelets 144 (*)    All other components within normal limits  PROTIME-INR    EKG None  Radiology US Venous Img Lower Unilateral Left  Result Date: 01/30/2019 CLINICAL DATA:  Left leg pain and swelling EXAM: LEFT LOWER EXTREMITY VENOUS DOPPLER ULTRASOUND TECHNIQUE: Gray-scale sonography with graded compression, as well as color Doppler and duplex ultrasound were performed to evaluate the lower extremity deep venous systems from the level of the common femoral vein and including the common femoral, femoral, profunda femoral, popliteal and calf veins including the posterior tibial, peroneal and gastrocnemius veins when visible. The superficial great saphenous vein was also interrogated. Spectral Doppler was utilized to evaluate flow at rest and with distal augmentation maneuvers in the common femoral, femoral and popliteal veins. COMPARISON:  None. FINDINGS: Contralateral Common Femoral Vein: Respiratory phasicity is normal and symmetric with the symptomatic side. No evidence of thrombus. Normal compressibility. Common Femoral Vein: No evidence of thrombus. Normal compressibility, respiratory phasicity and response to augmentation. Saphenofemoral Junction: No evidence of thrombus. Normal compressibility and flow on color Doppler imaging. Profunda Femoral Vein: No evidence of thrombus. Normal compressibility and flow on color Doppler imaging. Femoral Vein: There is occlusive and nonocclusive thrombus within a duplicated left femoral vein from the midportion and distally. Popliteal  Vein: There is occlusive thrombus within the popliteal vein. Calf Veins: Evaluation of the calf veins was limited. There is occlusive thrombus within the peroneal vein. Superficial Great Saphenous Vein: No evidence of thrombus. Normal compressibility. Venous Reflux:  Not evaluated. Other Findings:  Lower extremity edema is noted. IMPRESSION: 1. Extensive occlusive and nonocclusive thrombus from the mid femoral vein extending into the popliteal vein and left peroneal vein. 2. Lower extremity edema is noted. Electronically Signed   By: Constance Holster M.D.   On: 01/30/2019 18:54    Procedures Procedures (including critical care time)  Medications Ordered in ED Medications - No data to display  ED Course  I have reviewed the triage vital signs and the nursing notes.  Pertinent labs & imaging results that were available during my care of the patient were reviewed by me and considered in my medical decision making (see chart for details).  86 yo male presenting to the ED with 4 days of left lower extremity swelling.  No pain.  No numbness.    Presentation most concerning for DVT. We'll get an ultrasound.  No signs or symptoms cellulitis, nec fasc, or compartment syndrome  No signs or symptoms of PE.  He is concerned about diabetes, but I explained that this pattern is not consistent with diabetes.  He has no peripheral neuropathy.  All the same, his PCP can obtain appropriate diabetes workup as an outpatient.  Clinical Course as of Jan 30 1217  Fri Jan 30, 2019  1857 IMPRESSION: 1. Extensive occlusive and nonocclusive thrombus from the mid femoral vein extending into the popliteal vein and left peroneal vein. 2. Lower extremity edema is noted   [MT]  1916 Will check liver function, kidney function, to determine best A/C option   [MT]  2111 Patient strongly wishes to go home.  With normal liver and kidney function, believe it is reasonable to start him on Eliquis for DVT.  [place]  follow-up with his primary care provider.  Is very low clinical suspicion for pulmonary embolism at this time.  He has no hypoxia, no chest pain, no tachycardia, no  shortness of breath.  We did discuss home precautions being on a blood thinner.  He has been on Coumadin in the past and is aware of these precautions.  I also gave him return precautions for signs or symptoms of PE, which he is aware of.   [MT]    Clinical Course User Index [MT] Tamesha Ellerbrock, Carola Rhine, MD    Final Clinical Impression(s) / ED Diagnoses Final diagnoses:  Acute deep vein thrombosis (DVT) of femoral vein of left lower extremity ([place])  Acute deep vein thrombosis (DVT) of left peroneal vein ([place])    Rx / DC Orders ED Discharge Orders         Ordered    apixaban (ELIQUIS) 5 MG TABS tablet     01/30/19 2111           Wyvonnia Dusky, MD 01/31/19 1220

## 2019-01-30 NOTE — ED Notes (Signed)
Pt did not want to get in gown and RN Sam was informed.

## 2019-01-30 NOTE — Discharge Instructions (Addendum)
You were diagnosed with a large blood clot in your left leg.  Please read over the instructions included about DVT.  Your blood clot puts you at higher risk for Pulmonary Embolism, or clots that travel to your lungs.  You should call 911 immediately and come to the ER if you experience sudden onset of shortness of breath, chest pain, lightheadedness, or feel like passing out.  I also gave you a dose of Eliquis, a blood thinner.  You will need to stay on this for 3-6 months at a minimum.  Use them as instructed.  Please call your primary care doctor's office tomorrow to discuss your diagnosis and blood thinners.

## 2019-02-02 ENCOUNTER — Encounter: Payer: Self-pay | Admitting: Medical

## 2019-02-02 ENCOUNTER — Other Ambulatory Visit: Payer: Self-pay

## 2019-02-02 ENCOUNTER — Ambulatory Visit (INDEPENDENT_AMBULATORY_CARE_PROVIDER_SITE_OTHER): Payer: Medicare Other | Admitting: Medical

## 2019-02-02 VITALS — BP 146/58 | HR 57 | Temp 96.4°F | Resp 16 | Ht 69.0 in | Wt 205.2 lb

## 2019-02-02 DIAGNOSIS — I82402 Acute embolism and thrombosis of unspecified deep veins of left lower extremity: Secondary | ICD-10-CM

## 2019-02-02 NOTE — Progress Notes (Signed)
Subjective:    Patient ID: Luke [name], male    DOB: 1930-03-02, 86 y.o.   MRN: [id number]  HPI  Pt in for follow up.   ED evaluation is below.  Left leg swelling  Luke [name] is a 86 y.o. male with a history of DVT, not on anticoagulation, presented emergency department left leg swelling for a week.  He denies any falls or trauma.  He denies any pain in his leg.  He said he had noted to the left leg more swollen than normal.  He denies fevers or chills.  He denies any wounds to the legs.  Presented asking that he be checked for diabetes.  He said he was told that leg swelling could be related to diabetes.  He denies any new peripheral neuropathy.   Clinical course.     IMPRESSION: 1. Extensive occlusive and nonocclusive thrombus from the mid femoral vein extending into the popliteal vein and left peroneal vein. 2. Lower extremity edema is noted   [MT]  1916 Will check liver function, kidney function, to determine best A/C option   [MT]  2111 Patient strongly wishes to go home.  With normal liver and kidney function, believe it is reasonable to start him on Eliquis for DVT.  [place] follow-up with his primary care provider.  Is very low clinical suspicion for pulmonary embolism at this time.  He has no hypoxia, no chest pain, no tachycardia, no shortness of breath.  We did discuss home precautions being on a blood thinner.  He has been on Coumadin in the past and is aware of these precautions.  I also gave him return precautions for signs or symptoms of PE, which he is aware of.      Pt states since using eliquis he thinks calf less swollen. Pt has no sob, chest pain or wheezing.   His o2 sat is 95%  Pt has started eliquis. Pt started eliquis and and cost $1000 dollars for 108 tabs.  Pt had dvt 5 years ago and in same leg.    Pt has no acute dyspnea. But he states after covid just sitting around house and gaining weight has become deconditioned.    Review of  Systems  Constitutional: Negative for chills, fatigue and fever.  HENT: Positive for congestion.        Some nasal congestion for one moneh  Respiratory: Negative for cough, chest tightness, shortness of breath and wheezing.   Cardiovascular: Negative for chest pain and palpitations.  Gastrointestinal: Negative for abdominal pain, nausea and vomiting.  Musculoskeletal: Negative for back pain and myalgias.  Skin: Negative for rash.  Neurological: Negative for dizziness, weakness and light-headedness.  Hematological: Negative for adenopathy.  Psychiatric/Behavioral: Negative for behavioral problems and confusion.    Past Medical History:  Diagnosis Date  . Atrial fibrillation ([place])   . CAD (coronary artery disease)     5 with the left internal mamary to the left anterior  descending coronary artery.  [name] Kitchen DVT femoral (deep venous thrombosis) with thrombophlebitis ([place])   . Dyslipidemia   . HTN (hypertension)      Social History   Socioeconomic History  . Marital status: Widowed    Spouse name: Not on file  . Number of children: Not on file  . Years of education: Not on file  . Highest education level: Not on file  Occupational History  . Not on file  Tobacco Use  . Smoking status: Former Research scientist (life sciences)  . Smokeless  tobacco: Never Used  . Tobacco comment: stopped in the 1950's  Substance and Sexual Activity  . Alcohol use: Yes    Alcohol/week: 0.0 standard drinks    Comment: occ  . Drug use: No  . Sexual activity: Not on file  Other Topics Concern  . Not on file  Social History Narrative   He lives in [place] with his wife.  He does not smoke.He drives about two hours a day which increases his risk for DVT.  He is retired.  He is a Psychologist, occupational at [employer].          Social Determinants of Health   Financial Resource Strain:   . Difficulty of Paying Living Expenses: Not on file  Food Insecurity:   . Worried About Charity fundraiser in the Last Year:  Not on file  . Ran Out of Food in the Last Year: Not on file  Transportation Needs:   . Lack of Transportation (Medical): Not on file  . Lack of Transportation (Non-Medical): Not on file  Physical Activity:   . Days of Exercise per Week: Not on file  . Minutes of Exercise per Session: Not on file  Stress:   . Feeling of Stress : Not on file  Social Connections:   . Frequency of Communication with Friends and Family: Not on file  . Frequency of Social Gatherings with Friends and Family: Not on file  . Attends Religious Services: Not on file  . Active Member of Clubs or Organizations: Not on file  . Attends Archivist Meetings: Not on file  . Marital Status: Not on file  Intimate Partner Violence:   . Fear of Current or Ex-Partner: Not on file  . Emotionally Abused: Not on file  . Physically Abused: Not on file  . Sexually Abused: Not on file    Past Surgical History:  Procedure Laterality Date  . CORONARY ARTERY BYPASS GRAFT  2001  . I & D EXTREMITY Right 11/18/2016   Procedure: IRRIGATION AND DEBRIDEMENT RIGHT LONG AND RING FINGER;  Surgeon: Netta Cedars, MD;  Location: [facility];  Service: Orthopedics;  Laterality: Right;  . LEFT HEART CATH AND CORS/GRAFTS ANGIOGRAPHY N/A 07/19/2016   Procedure: Left Heart Cath and Cors/Grafts Angiography;  Surgeon: Jettie Booze, MD;  Location: [facility];  Service: Cardiovascular;  Laterality: N/A;    Family History  Problem Relation Age of Onset  . Heart disease Mother   . Cancer Mother   . Heart disease Father   . Heart attack Father   . Coronary artery disease Other     Allergies  Allergen Reactions  . Statins Other (See Comments)    REACTION: Reaction not known    Current Outpatient Medications on File Prior to Visit  Medication Sig Dispense Refill  . amLODipine (NORVASC) 10 MG tablet Take 1 tablet (10 mg total) by mouth daily. 90 tablet 3  . apixaban (ELIQUIS) 5 MG TABS tablet Take 2 tablets (10mg ) twice  daily for the first 7 days, then 1 tablet (5mg ) twice daily 120 tablet 0  . aspirin 81 MG tablet Take 81 mg by mouth daily.      [name] Kitchen atorvastatin (LIPITOR) 10 MG tablet TAKE 1 TABLET BY MOUTH EVERY MONDAY, WEDNESDAY AND FRIDAY 45 tablet 3  . benazepril (LOTENSIN) 40 MG tablet Take 1 tablet (40 mg total) by mouth daily. 90 tablet 3  . Coenzyme Q10 200 MG TABS Take 1 tablet by mouth  daily.    . diclofenac sodium (VOLTAREN) 1 % GEL Apply 2 g topically 4 (four) times daily. 100 g 2  . ezetimibe (ZETIA) 10 MG tablet Take 1 tablet (10 mg total) by mouth daily. 90 tablet 3  . glucosamine-chondroitin 500-400 MG tablet Take 1 tablet by mouth daily.     Javier Docker Oil 500 MG CAPS Take 500 mg by mouth daily.    . metoprolol tartrate (LOPRESSOR) 25 MG tablet Take 1 tablet (25 mg total) by mouth 2 (two) times daily. 180 tablet 3  . Multiple Vitamins-Minerals (CENTRUM SILVER PO) Take 1 tablet by mouth daily.     . OMEGA-3 FATTY ACIDS PO Take 1,200 mg by mouth 2 (two) times daily.     No current facility-administered medications on file prior to visit.    BP (!) 146/58 (BP Location: Right Arm, Patient Position: Sitting, Cuff Size: Normal)   Pulse (!) 57   Temp (!) 96.4 F (35.8 C) (Temporal)   Resp 16   Ht 5\' 9"  (1.753 m)   Wt 205 lb 3.2 oz (93.1 kg)   SpO2 95%   BMI 30.30 kg/m       Objective:   Physical Exam  General Mental Status- Alert. General Appearance- Not in acute distress.   Skin General: Color- Normal Color. Moisture- Normal Moisture.  Neck Carotid Arteries- Normal color. Moisture- Normal Moisture. No carotid bruits. No JVD.  Chest and Lung Exam Auscultation: Breath Sounds:-Normal.  Cardiovascular Auscultation:Rythm- Regular. Murmurs & Other Heart Sounds:Auscultation of the heart reveals- No Murmurs.  Abdomen Inspection:-Inspeection Normal. Palpation/Percussion:Note:No mass. Palpation and Percussion of the abdomen reveal- Non Tender, Non Distended + BS, no rebound or  guarding.   Neurologic Cranial Nerve exam:- CN III-XII intact(No nystagmus), symmetric smile Strength:- 5/5 equal and symmetric strength both upper and lower extremities.  Left lower ext-  Mid calf to proximal calf is swollen. But lower 1/2 not swollen. Negative homans sign. No warmth or tenderness     Assessment & Plan:  Pt has dvt recently. He feels better since starting eliquis. Will refer to hematologist since 2nd DVT. Eliquis was very expensive. Maybe after runs out of current tabs can be switched to xarelto if covered better.  Advised get or use his 02 sat monitor. Educated on good readings. If low readings or abnormal dyspnea then ED for CT chest. In ED did not need ct of chest. And presently agree but will proceed with caution.  Follow up in 5 weeks pcp or as needed. Follow up sooner if hematologist appointment delayed.  Pt will check with [facility] to see if he has had pneumonia and influenza with them.   For mild nasal congestion can use flonase over the counter.  For frequent urination you mentioned offered psa. Pt deferred later date. He delcined to do that today. Will do on next visit.  40 minutes spent with patient today.  50% of time spent counseling patient on plan going forward and answering all of patient's questions.  Mackie Pai, PA-C

## 2019-02-02 NOTE — Patient Instructions (Addendum)
Pt has dvt recently. He feels better since starting eliquis. Will refer to hematologist since 2nd DVT. Eliquis was very expensive. Maybe after runs out of current tabs can be switched to xarelto if covered better.  Advised get or use his 02 sat monitor. Educated on good readings. If low readings or abnormal dyspnea then ED for CT chest. In ED did not need ct of chest. And presently agree but will proceed with caution.  Follow up in 5 weeks pcp or as needed. Follow up sooner if hematologist appointment delayed.   For mild nasal congestion can use flonase over the counter.  For frequent urination you mentioned offered psa. Pt deferred later date. He delcined to do that today. Will do on next visit.

## 2019-02-04 ENCOUNTER — Telehealth: Payer: Self-pay | Admitting: Family Medicine

## 2019-02-05 ENCOUNTER — Ambulatory Visit: Payer: Medicare Other

## 2019-02-06 ENCOUNTER — Other Ambulatory Visit: Payer: Self-pay | Admitting: Hematology

## 2019-02-06 ENCOUNTER — Other Ambulatory Visit: Payer: Self-pay

## 2019-02-06 ENCOUNTER — Other Ambulatory Visit: Payer: Self-pay | Admitting: *Deleted

## 2019-02-06 ENCOUNTER — Inpatient Hospital Stay: Payer: Medicare Other

## 2019-02-06 ENCOUNTER — Inpatient Hospital Stay: Payer: Medicare Other | Attending: Hematology | Admitting: Hematology

## 2019-02-06 ENCOUNTER — Telehealth: Payer: Self-pay | Admitting: Family Medicine

## 2019-02-06 ENCOUNTER — Encounter: Payer: Self-pay | Admitting: Hematology

## 2019-02-06 VITALS — BP 134/51 | HR 54 | Resp 18

## 2019-02-06 DIAGNOSIS — I82412 Acute embolism and thrombosis of left femoral vein: Secondary | ICD-10-CM | POA: Diagnosis not present

## 2019-02-06 DIAGNOSIS — I82452 Acute embolism and thrombosis of left peroneal vein: Secondary | ICD-10-CM | POA: Diagnosis not present

## 2019-02-06 DIAGNOSIS — I82402 Acute embolism and thrombosis of unspecified deep veins of left lower extremity: Secondary | ICD-10-CM

## 2019-02-06 DIAGNOSIS — I82432 Acute embolism and thrombosis of left popliteal vein: Secondary | ICD-10-CM | POA: Diagnosis not present

## 2019-02-06 DIAGNOSIS — Z7901 Long term (current) use of anticoagulants: Secondary | ICD-10-CM | POA: Diagnosis not present

## 2019-02-06 DIAGNOSIS — I82409 Acute embolism and thrombosis of unspecified deep veins of unspecified lower extremity: Secondary | ICD-10-CM

## 2019-02-06 DIAGNOSIS — Z809 Family history of malignant neoplasm, unspecified: Secondary | ICD-10-CM | POA: Insufficient documentation

## 2019-02-06 DIAGNOSIS — Z87891 Personal history of nicotine dependence: Secondary | ICD-10-CM | POA: Diagnosis not present

## 2019-02-06 HISTORY — DX: Acute embolism and thrombosis of unspecified deep veins of unspecified lower extremity: I82.409

## 2019-02-06 LAB — CMP (CANCER CENTER ONLY)
ALT: 12 U/L (ref 0–44)
AST: 12 U/L — ABNORMAL LOW (ref 15–41)
Albumin: 4.2 g/dL (ref 3.5–5.0)
Alkaline Phosphatase: 45 U/L (ref 38–126)
Anion gap: 7 (ref 5–15)
BUN: 28 mg/dL — ABNORMAL HIGH (ref 8–23)
CO2: 28 mmol/L (ref 22–32)
Calcium: 9.6 mg/dL (ref 8.9–10.3)
Chloride: 109 mmol/L (ref 98–111)
Creatinine: 1.37 mg/dL — ABNORMAL HIGH (ref 0.61–1.24)
GFR, Est AFR Am: 54 mL/min — ABNORMAL LOW (ref >60)
GFR, Estimated: 46 mL/min — ABNORMAL LOW (ref >60)
Glucose, Bld: 91 mg/dL (ref 70–99)
Potassium: 4.7 mmol/L (ref 3.5–5.1)
Sodium: 144 mmol/L (ref 135–145)
Total Bilirubin: 0.4 mg/dL (ref 0.3–1.2)
Total Protein: 6.8 g/dL (ref 6.5–8.1)

## 2019-02-06 LAB — D-DIMER, QUANTITATIVE: D-Dimer, Quant: 2.83 ug/mL-FEU — ABNORMAL HIGH (ref 0.00–0.50)

## 2019-02-06 LAB — CBC WITH DIFFERENTIAL (CANCER CENTER ONLY)
Abs Immature Granulocytes: 0.03 10*3/uL (ref 0.00–0.07)
Basophils Absolute: 0.1 10*3/uL (ref 0.0–0.1)
Basophils Relative: 1 %
Eosinophils Absolute: 0.3 10*3/uL (ref 0.0–0.5)
Eosinophils Relative: 5 %
HCT: 39.4 % (ref 39.0–52.0)
Hemoglobin: 13 g/dL (ref 13.0–17.0)
Immature Granulocytes: 0 %
Lymphocytes Relative: 26 %
Lymphs Abs: 1.8 10*3/uL (ref 0.7–4.0)
MCH: 32.3 pg (ref 26.0–34.0)
MCHC: 33 g/dL (ref 30.0–36.0)
MCV: 98 fL (ref 80.0–100.0)
Monocytes Absolute: 0.6 10*3/uL (ref 0.1–1.0)
Monocytes Relative: 9 %
Neutro Abs: 4 10*3/uL (ref 1.7–7.7)
Neutrophils Relative %: 59 %
Platelet Count: 192 10*3/uL (ref 150–400)
RBC: 4.02 MIL/uL — ABNORMAL LOW (ref 4.22–5.81)
RDW: 11.9 % (ref 11.5–15.5)
WBC Count: 6.8 10*3/uL (ref 4.0–10.5)
nRBC: 0 % (ref 0.0–0.2)

## 2019-02-06 MED ORDER — APIXABAN 5 MG PO TABS: 5.0000 mg | ORAL_TABLET | Freq: Two times a day (BID) | ORAL | 5 refills | Status: DC

## 2019-02-06 MED ORDER — APIXABAN 5 MG PO TABS: ORAL_TABLET | ORAL | 0 refills | Status: DC

## 2019-02-06 MED FILL — ELIQUIS 5 MG TABLET: 5 | 30 days supply | Qty: 74 | Fill #0

## 2019-02-06 NOTE — Progress Notes (Signed)
[facility] NOTE  Patient Care Team: Shelda Pal, DO as PCP - General (Family Medicine)  HEME/ONC OVERVIEW: 1. Acute LLE, unprovoked  -2016: hx of left lower extremity DVT, treated with warfarin x ~6 months  -01/2019: extensive occlusive and non-occlusive DVT from mid-femoral to popliteal and peroneal veins in the left lower extremity   On Eliquis 67m BID   ASSESSMENT & PLAN:   Acute LLE, unprovoked  -I reviewed the patient's records in detail, including ED and PCP clinic notes, lab studies, and imaging results -In [facility], for which Doppler was done and showed extensive occlusive and nonocclusive DVT from the mid femoral to popliteal and peroneal veins in the left lower extremity.  Patient was given Eliquis starter pack, and referred to hematology for further evaluation. -I reviewed the imaging results in detail with the patient -This last episode of blood clot appeared to be unprovoked.  -I reviewed with the patient about the plan for care for DVT.  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    -Patient is tolerating Eliquis 565mBID well without significant bleeding or excess bruising.  However, he has very high out-of-pocket cost for the medication, but it's unclear if that's due to his insurance coverage or "donut hole".  I have asked the financial counselor to assist with the medication.  -If his insurance requires changing to a different DOAC, such as Xarelto, we can transition him to the preferred medication.  However, if it is the "donut hole," then he will have to pay out-of-pocket until his annual deductible is met.  -The goal is to treat with full anticoagulation for 3-6 months, after which the dose can be lowered for  secondary prophylaxis  -I recommend the patient to use elastic compression stockings at 20-30 mmHg to reduce risks of chronic thrombophlebitis. -Finally, I reinforced the importance of preventive strategies such as avoiding hormonal supplement, avoiding cigarette smoking, keeping up-to-date with screening programs for cancer detection with PCP, frequent ambulation for long distance travel and aggressive DVT prophylaxis in all surgical settings. -Should he need any interruption of the anticoagulation for elective procedures in the future, feel free to contact me regarding peri-operative management.  Elevated creatinine -Likely due to mild CKD -Cr 1.37 today, stable; electrolytes normal -I recommend the patient to follow up with PCP for further management   Orders Placed This Encounter  Procedures  . CBC with Differential (Cancer Center Only)    Standing Status:   Future    Standing Expiration Date:   03/12/2020  . CMP ([facility])    Standing Status:   Future    Standing Expiration Date:   03/12/2020  . [facility]   Standing Status:   Future    Standing Expiration Date:   03/12/2020   The total time spent in the appointment was 54 minutes encounter with patients including review of chart and various tests results, discussions about plan of care and coordination of care plan  All questions were answered. The patient knows to call the clinic with any problems, questions or concerns. No barriers to learning was detected.  Return in 3 months for labs and clinic follow-up.  YaTish MenMD 1/29/202112:51 PM  CHIEF COMPLAINTS/PURPOSE  OF CONSULTATION:  "I am doing fine"  HISTORY OF PRESENTING ILLNESS:  Luke [name] 86 y.o. male is here because of recurrent, acute left lower extremity DVT.  Patient reports that he was diagnosed with a left lower extremity DVT about 5 years ago, for which he was treated with warfarin for about 6 months.  He had to have a dental  procedure, and warfarin was stopped at that point.  However, warfarin was not resumed after the procedure.  He did not recall any provoking/triggering factor at that time that led to the DVT.  He was doing well until about a week prior to the ED visit, when he developed new onset left lower extremity swelling.  He initially thought that it was due to gout, but in the ED, Doppler showed extensive left lower extremity DVT, for which he was discharged with Eliquis.  He has been tolerating Eliquis well without any abnormal bleeding or bruising.  However, his out-of-pocket cost was over $1,000 and his PCP referred him to hematology for medication assistance.  Patient denies any other complaint today.  REVIEW OF SYSTEMS:   Constitutional: ( - ) fevers, ( - )  chills , ( - ) night sweats Eyes: ( - ) blurriness of vision, ( - ) double vision, ( - ) watery eyes Ears, nose, mouth, throat, and face: ( - ) mucositis, ( - ) sore throat Respiratory: ( - ) cough, ( - ) dyspnea, ( - ) wheezes Cardiovascular: ( - ) palpitation, ( - ) chest discomfort, ( + ) lower extremity swelling Gastrointestinal:  ( - ) nausea, ( - ) heartburn, ( - ) change in bowel habits Skin: ( - ) abnormal skin rashes Lymphatics: ( - ) new lymphadenopathy, ( - ) easy bruising Neurological: ( - ) numbness, ( - ) tingling, ( - ) new weaknesses Behavioral/Psych: ( - ) mood change, ( - ) new changes  All other systems were reviewed with the patient and are negative.  I have reviewed his chart and materials related to his cancer extensively and collaborated history with the patient. Summary of oncologic history is as follows: Oncology History   No history exists.    MEDICAL HISTORY:  Past Medical History:  Diagnosis Date  . Atrial fibrillation ([place])   . CAD (coronary artery disease)     5 with the left internal mamary to the left anterior  descending coronary artery.  [name] Kitchen DVT femoral (deep venous thrombosis) with thrombophlebitis ([place])   .  Dyslipidemia   . HTN (hypertension)     SURGICAL HISTORY: Past Surgical History:  Procedure Laterality Date  . CORONARY ARTERY BYPASS GRAFT  2001  . I & D EXTREMITY Right 11/18/2016   Procedure: IRRIGATION AND DEBRIDEMENT RIGHT LONG AND RING FINGER;  Surgeon: Netta Cedars, MD;  Location: [facility];  Service: Orthopedics;  Laterality: Right;  . LEFT HEART CATH AND CORS/GRAFTS ANGIOGRAPHY N/A 07/19/2016   Procedure: Left Heart Cath and Cors/Grafts Angiography;  Surgeon: Jettie Booze, MD;  Location: [facility];  Service: Cardiovascular;  Laterality: N/A;    SOCIAL HISTORY: Social History   Socioeconomic History  . Marital status: Widowed    Spouse name: Not on file  . Number of children: Not on file  . Years of education: Not on file  . Highest education level: Not on file  Occupational History  . Not on file  Tobacco Use  . Smoking status: Former Research scientist (life sciences)  . Smokeless tobacco: Never Used  .  Tobacco comment: stopped in the 1950's  Substance and Sexual Activity  . Alcohol use: Yes    Alcohol/week: 0.0 standard drinks    Comment: occ  . Drug use: No  . Sexual activity: Not on file  Other Topics Concern  . Not on file  Social History Narrative   He lives in [place] with his wife.  He does not smoke.He drives about two hours a day which increases his risk for DVT.  He is retired.  He is a Psychologist, occupational at [employer].          Social Determinants of Health   Financial Resource Strain:   . Difficulty of Paying Living Expenses: Not on file  Food Insecurity:   . Worried About Charity fundraiser in the Last Year: Not on file  . Ran Out of Food in the Last Year: Not on file  Transportation Needs:   . Lack of Transportation (Medical): Not on file  . Lack of Transportation (Non-Medical): Not on file  Physical Activity:   . Days of Exercise per Week: Not on file  . Minutes of Exercise per Session: Not on file  Stress:   . Feeling of Stress : Not  on file  Social Connections:   . Frequency of Communication with Friends and Family: Not on file  . Frequency of Social Gatherings with Friends and Family: Not on file  . Attends Religious Services: Not on file  . Active Member of Clubs or Organizations: Not on file  . Attends Archivist Meetings: Not on file  . Marital Status: Not on file  Intimate Partner Violence:   . Fear of Current or Ex-Partner: Not on file  . Emotionally Abused: Not on file  . Physically Abused: Not on file  . Sexually Abused: Not on file    FAMILY HISTORY: Family History  Problem Relation Age of Onset  . Heart disease Mother   . Cancer Mother   . Heart disease Father   . Heart attack Father   . Coronary artery disease Other     ALLERGIES:  is allergic to statins.  MEDICATIONS:  Current Outpatient Medications  Medication Sig Dispense Refill  . amLODipine (NORVASC) 10 MG tablet Take 1 tablet (10 mg total) by mouth daily. 90 tablet 3  . apixaban (ELIQUIS) 5 MG TABS tablet Take 1 tablet (5 mg total) by mouth 2 (two) times daily. Take 2 tablets (76m) twice daily for the first 7 days, then 1 tablet (536m twice daily 60 tablet 5  . aspirin 81 MG tablet Take 81 mg by mouth daily.      . [name] Kitchentorvastatin (LIPITOR) 10 MG tablet TAKE 1 TABLET BY MOUTH EVERY MONDAY, WEDNESDAY AND FRIDAY 45 tablet 3  . benazepril (LOTENSIN) 40 MG tablet Take 1 tablet (40 mg total) by mouth daily. 90 tablet 3  . Coenzyme Q10 200 MG TABS Take 1 tablet by mouth daily.    . diclofenac sodium (VOLTAREN) 1 % GEL Apply 2 g topically 4 (four) times daily. 100 g 2  . ezetimibe (ZETIA) 10 MG tablet Take 1 tablet (10 mg total) by mouth daily. 90 tablet 3  . glucosamine-chondroitin 500-400 MG tablet Take 1 tablet by mouth daily.     . Javier Dockeril 500 MG CAPS Take 500 mg by mouth daily.    . metoprolol tartrate (LOPRESSOR) 25 MG tablet Take 1 tablet (25 mg total) by mouth 2 (two) times daily. 180 tablet 3  . Multiple  Vitamins-Minerals  (CENTRUM SILVER PO) Take 1 tablet by mouth daily.     . OMEGA-3 FATTY ACIDS PO Take 1,200 mg by mouth 2 (two) times daily.     No current facility-administered medications for this visit.    PHYSICAL EXAMINATION: ECOG PERFORMANCE STATUS: 0 - Asymptomatic  Vitals:   02/06/19 1218  BP: (!) 134/51  Pulse: (!) 54  Resp: 18  SpO2: 99%   There were no vitals filed for this visit.  GENERAL: alert, no distress and comfortable SKIN: skin color, texture, turgor are normal, no rashes or significant lesions EYES: conjunctiva are pink and non-injected, sclera clear OROPHARYNX: no exudate, no erythema; lips, buccal mucosa, and tongue normal  NECK: supple, non-tender LUNGS: clear to auscultation with normal breathing effort HEART: regular rate & rhythm, no murmurs, 1-2+ left lower extremity edema, no right lower extremity edema  ABDOMEN: soft, non-tender, non-distended, normal bowel sounds Musculoskeletal: no cyanosis of digits and no clubbing  PSYCH: alert & oriented x 3, fluent speech NEURO: no focal motor/sensory deficits  LABORATORY DATA:  I have reviewed the data as listed Lab Results  Component Value Date   WBC 6.8 02/06/2019   HGB 13.0 02/06/2019   HCT 39.4 02/06/2019   MCV 98.0 02/06/2019   PLT 192 02/06/2019   Lab Results  Component Value Date   NA 144 02/06/2019   K 4.7 02/06/2019   CL 109 02/06/2019   CO2 28 02/06/2019    RADIOGRAPHIC STUDIES: I have personally reviewed the radiological images as listed and agreed with the findings in the report. US Venous Img Lower Unilateral Left  Result Date: 01/30/2019 CLINICAL DATA:  Left leg pain and swelling EXAM: LEFT LOWER EXTREMITY VENOUS DOPPLER ULTRASOUND TECHNIQUE: Gray-scale sonography with graded compression, as well as color Doppler and duplex ultrasound were performed to evaluate the lower extremity deep venous systems from the level of the common femoral vein and including the common femoral, femoral, profunda  femoral, popliteal and calf veins including the posterior tibial, peroneal and gastrocnemius veins when visible. The superficial great saphenous vein was also interrogated. Spectral Doppler was utilized to evaluate flow at rest and with distal augmentation maneuvers in the common femoral, femoral and popliteal veins. COMPARISON:  None. FINDINGS: Contralateral Common Femoral Vein: Respiratory phasicity is normal and symmetric with the symptomatic side. No evidence of thrombus. Normal compressibility. Common Femoral Vein: No evidence of thrombus. Normal compressibility, respiratory phasicity and response to augmentation. Saphenofemoral Junction: No evidence of thrombus. Normal compressibility and flow on color Doppler imaging. Profunda Femoral Vein: No evidence of thrombus. Normal compressibility and flow on color Doppler imaging. Femoral Vein: There is occlusive and nonocclusive thrombus within a duplicated left femoral vein from the midportion and distally. Popliteal Vein: There is occlusive thrombus within the popliteal vein. Calf Veins: Evaluation of the calf veins was limited. There is occlusive thrombus within the peroneal vein. Superficial Great Saphenous Vein: No evidence of thrombus. Normal compressibility. Venous Reflux:  Not evaluated. Other Findings:  Lower extremity edema is noted. IMPRESSION: 1. Extensive occlusive and nonocclusive thrombus from the mid femoral vein extending into the popliteal vein and left peroneal vein. 2. Lower extremity edema is noted. Electronically Signed   By: Constance Holster M.D.   On: 01/30/2019 18:54    PATHOLOGY: I have reviewed the pathology reports as documented in the oncologist history.

## 2019-02-06 NOTE — Telephone Encounter (Signed)
Pt came in office stating on mychart he received a message stating that he is due for an pneumonia shot, pt states already got this pneumonia shot on 2017 at [facility] and in 2018 had mimomax for pneumonia done. Pt wanted to inform provider. Pt tel [phone].

## 2019-02-06 NOTE — Telephone Encounter (Signed)
Called the patient and he will take care of getting records from [facility] to our office

## 2019-02-06 NOTE — Telephone Encounter (Signed)
Plz request records from [facility] and update chart. Ty.

## 2019-02-13 ENCOUNTER — Ambulatory Visit: Payer: Medicare Other | Attending: Internal Medicine

## 2019-02-13 DIAGNOSIS — Z23 Encounter for immunization: Secondary | ICD-10-CM

## 2019-02-13 NOTE — Progress Notes (Signed)
   Covid-19 Vaccination Clinic  Name:  Luke [name]    MRN: [id number] DOB: Sep 09, 1930  02/13/2019  [name] was observed post Covid-19 immunization for 15 minutes without incidence. He was provided with Vaccine Information Sheet and instruction to access the V-Safe system.   [name] was instructed to call 911 with any severe reactions post vaccine: [name] Kitchen Difficulty breathing  . Swelling of your face and throat  . A fast heartbeat  . A bad rash all over your body  . Dizziness and weakness    Immunizations Administered    Name Date Dose VIS Date Route   Pfizer COVID-19 Vaccine 02/13/2019  2:56 PM 0.3 mL 12/19/2018 Intramuscular   Manufacturer: [place]   Lot: [id number]   [place]: [id number]

## 2019-02-16 ENCOUNTER — Ambulatory Visit: Payer: Medicare Other

## 2019-02-23 ENCOUNTER — Other Ambulatory Visit: Payer: Self-pay

## 2019-02-24 ENCOUNTER — Ambulatory Visit (INDEPENDENT_AMBULATORY_CARE_PROVIDER_SITE_OTHER): Payer: Medicare Other | Admitting: Family Medicine

## 2019-02-24 ENCOUNTER — Other Ambulatory Visit: Payer: Self-pay

## 2019-02-24 ENCOUNTER — Encounter: Payer: Self-pay | Admitting: Family Medicine

## 2019-02-24 VITALS — BP 134/82 | HR 65 | Temp 96.8°F | Ht 69.0 in | Wt 208.0 lb

## 2019-02-24 DIAGNOSIS — T464X5A Adverse effect of angiotensin-converting-enzyme inhibitors, initial encounter: Secondary | ICD-10-CM | POA: Diagnosis not present

## 2019-02-24 DIAGNOSIS — I82403 Acute embolism and thrombosis of unspecified deep veins of lower extremity, bilateral: Secondary | ICD-10-CM

## 2019-02-24 DIAGNOSIS — R058 Other specified cough: Secondary | ICD-10-CM

## 2019-02-24 DIAGNOSIS — R05 Cough: Secondary | ICD-10-CM

## 2019-02-24 MED ORDER — LOSARTAN POTASSIUM 100 MG PO TABS: 100.0000 mg | ORAL_TABLET | Freq: Every day | ORAL | 2 refills | Status: DC

## 2019-02-24 NOTE — Progress Notes (Signed)
Chief Complaint  Patient presents with  . Follow-up    Subjective: Patient is a 86 y.o. male here for f/u.  Patient was diagnosed with a left-sided DVT on 1/22 in the emergency department.  He started on Eliquis.  He is currently following with the hematology team.  His biggest concern is Eliquis and the cost.  He is tolerating the medicine well overall.  The hematology team is working on a payment assistance program.  He has been on Coumadin in the past and had side effects including depression on it.  Once he came off of it, the depression was gone.  He preferrs not to go back on that medication.  Patient has a history of coronary artery disease.  He does follow with the cardiology team.  He has a cough associated with his benazepril, at least he wonders.  He has never tried anything else.  He gets myopathy with various statins and is currently taking a low-dose of Lipitor 10 mg daily 3 times a week.  ROS: Heart: Denies chest pain  Lungs: Denies SOB   Past Medical History:  Diagnosis Date  . Atrial fibrillation ([place])   . CAD (coronary artery disease)     5 with the left internal mamary to the left anterior  descending coronary artery.  [name] Kitchen DVT femoral (deep venous thrombosis) with thrombophlebitis ([place])   . Dyslipidemia   . HTN (hypertension)     Objective: BP 134/82 (BP Location: Right Arm, Patient Position: Sitting, Cuff Size: Normal)   Pulse 65   Temp (!) 96.8 F (36 C) (Temporal)   Ht 5\' 9"  (1.753 m)   Wt 94.3 kg   SpO2 96%   BMI 30.72 kg/m  General: Awake, appears stated age HEENT: MMM, EOMi Heart: RRR, no bruits Lungs: CTAB, no rales, wheezes or rhonchi. 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  Let me know if there are  cost issues. 2-I do not think the patient can take Coumadin due to prior side effects.  I believe it is medically necessary for him to be on a NOAC. Ideally Xarelto or Eliquis, cost permitting. F/u in 1 yr for AWV or prn.  The patient voiced understanding and agreement to the plan.  [place], DO 02/24/19  2:32 PM

## 2019-02-24 NOTE — Patient Instructions (Signed)
We are exchanging medications. I think following up with your heart doctor is a good idea also.  Keep the diet clean and stay active.  Let us know if you need anything.

## 2019-02-27 ENCOUNTER — Telehealth: Payer: Self-pay | Admitting: Family Medicine

## 2019-02-27 ENCOUNTER — Telehealth: Payer: Self-pay

## 2019-02-27 DIAGNOSIS — I82402 Acute embolism and thrombosis of unspecified deep veins of left lower extremity: Secondary | ICD-10-CM

## 2019-02-27 MED ORDER — APIXABAN 5 MG PO TABS: 5.0000 mg | ORAL_TABLET | Freq: Two times a day (BID) | ORAL | 1 refills | Status: DC

## 2019-02-27 NOTE — Telephone Encounter (Signed)
See other telephone note regarding this message.

## 2019-02-27 NOTE — Telephone Encounter (Signed)
[facility] is the location he would like his mail in pharmacy sent to

## 2019-02-27 NOTE — Telephone Encounter (Signed)
Patient called in to see if Dr. Nani Ravens can send in a prescription for apixaban (ELIQUIS) 5 MG TABS tablet [id number]   Patients pharmacy does not have the prescription.   Please follow up with the patient as soon as possible at [phone] Thanks,

## 2019-02-27 NOTE — Telephone Encounter (Signed)
Called to confirm which pharmacy to send to and had to leave message to call us back.

## 2019-02-27 NOTE — Telephone Encounter (Signed)
Sent into [facility].patient called back with pharmacy name

## 2019-03-04 ENCOUNTER — Telehealth: Payer: Self-pay

## 2019-03-04 NOTE — Telephone Encounter (Signed)
PA form completed and faxed back to [facility] at [phone]. Awaiting determination.

## 2019-03-06 MED ORDER — RIVAROXABAN 20 MG PO TABS: 20.0000 mg | ORAL_TABLET | Freq: Every day | ORAL | 1 refills | Status: DC

## 2019-03-06 NOTE — Telephone Encounter (Signed)
Xarelto sent.

## 2019-03-06 NOTE — Telephone Encounter (Signed)
PA denied. Preferred alternative: Xarelto.

## 2019-03-09 NOTE — Telephone Encounter (Signed)
Left detailed message that medication has been changed to Xarelto.

## 2019-03-10 ENCOUNTER — Ambulatory Visit: Payer: Medicare Other | Attending: Internal Medicine

## 2019-03-10 DIAGNOSIS — Z23 Encounter for immunization: Secondary | ICD-10-CM | POA: Insufficient documentation

## 2019-03-10 NOTE — Progress Notes (Signed)
   Covid-19 Vaccination Clinic  Name:  Luke [name]    MRN: [id number] DOB: 21-May-1930  03/10/2019  [name] was observed post Covid-19 immunization for1 15 minutes without incident. He was provided with Vaccine Information Sheet and instruction to access the V-Safe system.   [name] was instructed to call 911 with any severe reactions post vaccine: [name] Kitchen Difficulty breathing  . Swelling of face and throat  . A fast heartbeat  . A bad rash all over body  . Dizziness and weakness   Immunizations Administered    Name Date Dose VIS Date Route   Pfizer COVID-19 Vaccine 03/10/2019  2:26 PM 0.3 mL 12/19/2018 Intramuscular   Manufacturer: [place]   Lot: [id number]   [place]: [id number]

## 2019-05-07 ENCOUNTER — Encounter: Payer: Self-pay | Admitting: Hematology

## 2019-05-07 ENCOUNTER — Inpatient Hospital Stay (HOSPITAL_BASED_OUTPATIENT_CLINIC_OR_DEPARTMENT_OTHER): Payer: Medicare Other | Admitting: Hematology

## 2019-05-07 ENCOUNTER — Inpatient Hospital Stay: Payer: Medicare Other | Attending: Hematology

## 2019-05-07 ENCOUNTER — Other Ambulatory Visit: Payer: Self-pay

## 2019-05-07 VITALS — BP 140/52 | HR 95 | Temp 97.6°F | Resp 18 | Ht 69.0 in | Wt 209.4 lb

## 2019-05-07 DIAGNOSIS — R202 Paresthesia of skin: Secondary | ICD-10-CM

## 2019-05-07 DIAGNOSIS — I82412 Acute embolism and thrombosis of left femoral vein: Secondary | ICD-10-CM | POA: Insufficient documentation

## 2019-05-07 DIAGNOSIS — Z7982 Long term (current) use of aspirin: Secondary | ICD-10-CM | POA: Insufficient documentation

## 2019-05-07 DIAGNOSIS — I82452 Acute embolism and thrombosis of left peroneal vein: Secondary | ICD-10-CM | POA: Diagnosis not present

## 2019-05-07 DIAGNOSIS — I82402 Acute embolism and thrombosis of unspecified deep veins of left lower extremity: Secondary | ICD-10-CM

## 2019-05-07 DIAGNOSIS — Z7901 Long term (current) use of anticoagulants: Secondary | ICD-10-CM | POA: Insufficient documentation

## 2019-05-07 DIAGNOSIS — I82432 Acute embolism and thrombosis of left popliteal vein: Secondary | ICD-10-CM | POA: Insufficient documentation

## 2019-05-07 DIAGNOSIS — R200 Anesthesia of skin: Secondary | ICD-10-CM | POA: Diagnosis not present

## 2019-05-07 LAB — CBC WITH DIFFERENTIAL (CANCER CENTER ONLY)
Abs Immature Granulocytes: 0.03 10*3/uL (ref 0.00–0.07)
Basophils Absolute: 0 10*3/uL (ref 0.0–0.1)
Basophils Relative: 1 %
Eosinophils Absolute: 0.3 10*3/uL (ref 0.0–0.5)
Eosinophils Relative: 5 %
HCT: 39.8 % (ref 39.0–52.0)
Hemoglobin: 13.3 g/dL (ref 13.0–17.0)
Immature Granulocytes: 1 %
Lymphocytes Relative: 29 %
Lymphs Abs: 1.7 10*3/uL (ref 0.7–4.0)
MCH: 32 pg (ref 26.0–34.0)
MCHC: 33.4 g/dL (ref 30.0–36.0)
MCV: 95.9 fL (ref 80.0–100.0)
Monocytes Absolute: 0.5 10*3/uL (ref 0.1–1.0)
Monocytes Relative: 9 %
Neutro Abs: 3.3 10*3/uL (ref 1.7–7.7)
Neutrophils Relative %: 55 %
Platelet Count: 182 10*3/uL (ref 150–400)
RBC: 4.15 MIL/uL — ABNORMAL LOW (ref 4.22–5.81)
RDW: 12.8 % (ref 11.5–15.5)
WBC Count: 5.9 10*3/uL (ref 4.0–10.5)
nRBC: 0 % (ref 0.0–0.2)

## 2019-05-07 LAB — D-DIMER, QUANTITATIVE: D-Dimer, Quant: 0.78 ug/mL-FEU — ABNORMAL HIGH (ref 0.00–0.50)

## 2019-05-07 LAB — CMP (CANCER CENTER ONLY)
ALT: 16 U/L (ref 0–44)
AST: 16 U/L (ref 15–41)
Albumin: 4.1 g/dL (ref 3.5–5.0)
Alkaline Phosphatase: 42 U/L (ref 38–126)
Anion gap: 6 (ref 5–15)
BUN: 19 mg/dL (ref 8–23)
CO2: 28 mmol/L (ref 22–32)
Calcium: 9.5 mg/dL (ref 8.9–10.3)
Chloride: 110 mmol/L (ref 98–111)
Creatinine: 1.17 mg/dL (ref 0.61–1.24)
GFR, Est AFR Am: >60 mL/min (ref >60)
GFR, Estimated: 56 mL/min — ABNORMAL LOW (ref >60)
Glucose, Bld: 97 mg/dL (ref 70–99)
Potassium: 4.2 mmol/L (ref 3.5–5.1)
Sodium: 144 mmol/L (ref 135–145)
Total Bilirubin: 0.4 mg/dL (ref 0.3–1.2)
Total Protein: 6.6 g/dL (ref 6.5–8.1)

## 2019-05-07 NOTE — Progress Notes (Signed)
[place] OFFICE PROGRESS NOTE  Patient Care Team: Shelda Pal, DO as PCP - General (Family Medicine)  HEME/ONC OVERVIEW: 1. Acute LLE, unprovoked  -2016: hx of left lower extremity DVT, treated with warfarin x ~6 months  -01/2019: extensive occlusive and non-occlusive DVT from mid-femoral to popliteal and peroneal veins in the left lower extremity   On Eliquis 5mg  BID   ASSESSMENT & PLAN:   Acute LLE, unprovoked  -Currently on Eliquis 5mg  BID without any abnormal bleeding or excess bruising -No evidence of recurrent or progressive DVT on exam today  -The goal of anticoagulation is lifelong, given the unprovoked, recurrent LLE DVT  -Due to the recent extensive DVT and mildly elevated d-dimer (albeit improving), we will plan to continue Eliquis 5mg  BID for a total of 6 months (ie until 07/2019), after which the dose of the anticoagulation can be lowered to 2.5mg  BID for secondary prophylaxis, so long as he does not have any evidence of recurrent DVT or other VTE's  -I reinforced the importance of compression stockings to reduce the risk of chronic thrombophlebitis, preventive strategies such as avoiding hormonal supplement, avoiding cigarette smoking, keeping up-to-date with screening programs for cancer detection with PCP, frequent ambulation for long distance travel and aggressive DVT prophylaxis in all surgical settings. -Should he need any interruption of the anticoagulation for elective procedures in the future, feel free to contact me regarding peri-operative management.  Left arm and hand tingling -Patient reports intermittent tingling sensation in the left hand and arm -Given his history of extensive osteoarthritis, including the spine, this may be due to nerve impingement -I recommended the patient to follow up with his PCP for further evaluation and management   Orders Placed This Encounter  Procedures  . CBC with Differential (Cancer Center Only)     Standing Status:   Future    Standing Expiration Date:   06/10/2020  . CMP ([place] only)    Standing Status:   Future    Standing Expiration Date:   06/10/2020  . D-dimer, quantitative (not at [facility])    Standing Status:   Future    Standing Expiration Date:   06/10/2020    The total time spent in the encounter was 30 minutes, including face-to-face time with the patient, review of various tests results, order additional studies/medications, documentation, and coordination of care plan.   All questions were answered. The patient knows to call the clinic with any problems, questions or concerns. No barriers to learning was detected.  Return in 3 months for labs and clinic follow-up.   Tish Men, MD 4/29/202111:11 AM  CHIEF COMPLAINT: "I am doing okay"  INTERVAL HISTORY: [name] returns clinic for follow-up of history of left lower extremity DVT on Eliquis.  Patient reports that he has been tolerating Eliquis well since last visit, and denies any significant bleeding.  He has chronic intermittent bruising, mostly over the bilateral forearms, due to being on aspirin.  Since starting Eliquis, bruising has not become significantly worse.  He was approved for free Eliquis from the manufacturer until the end of 2021.  He also reports that for the past several weeks, he has had intermittent tingling sensation over the left hand and arm, but he has not yet seen his PCP for further evaluation.  He denies any other complaint today.  REVIEW OF SYSTEMS:   Constitutional: ( - ) fevers, ( - )  chills , ( - ) night sweats Eyes: ( - ) blurriness  of vision, ( - ) double vision, ( - ) watery eyes Ears, nose, mouth, throat, and face: ( - ) mucositis, ( - ) sore throat Respiratory: ( - ) cough, ( - ) dyspnea, ( - ) wheezes Cardiovascular: ( - ) palpitation, ( - ) chest discomfort, ( - ) lower extremity swelling Gastrointestinal:  ( - ) nausea, ( - ) heartburn, ( - ) change in bowel habits Skin: ( - )  abnormal skin rashes Lymphatics: ( - ) new lymphadenopathy, ( + ) bruising Neurological: ( - ) numbness, ( + ) tingling, ( - ) new weaknesses Behavioral/Psych: ( - ) mood change, ( - ) new changes  All other systems were reviewed with the patient and are negative.  I have reviewed the past medical history, past surgical history, social history and family history with the patient and they are unchanged from previous note.  ALLERGIES:  is allergic to statins.  MEDICATIONS:  Current Outpatient Medications  Medication Sig Dispense Refill  . amLODipine (NORVASC) 10 MG tablet Take 1 tablet (10 mg total) by mouth daily. 90 tablet 3  . apixaban (ELIQUIS) 5 MG TABS tablet Take 5 mg by mouth 2 (two) times daily.    [name] Kitchen aspirin 81 MG tablet Take 81 mg by mouth daily.      [name] Kitchen atorvastatin (LIPITOR) 10 MG tablet TAKE 1 TABLET BY MOUTH EVERY MONDAY, WEDNESDAY AND FRIDAY 45 tablet 3  . benazepril (LOTENSIN) 40 MG tablet Take 40 mg by mouth daily.    . Coenzyme Q10 200 MG TABS Take 1 tablet by mouth daily.    . diclofenac sodium (VOLTAREN) 1 % GEL Apply 2 g topically 4 (four) times daily. 100 g 2  . ezetimibe (ZETIA) 10 MG tablet Take 1 tablet (10 mg total) by mouth daily. 90 tablet 3  . glucosamine-chondroitin 500-400 MG tablet Take 1 tablet by mouth daily.     Javier Docker Oil 500 MG CAPS Take 500 mg by mouth daily.    . metoprolol tartrate (LOPRESSOR) 25 MG tablet Take 1 tablet (25 mg total) by mouth 2 (two) times daily. 180 tablet 3  . Multiple Vitamins-Minerals (CENTRUM SILVER PO) Take 1 tablet by mouth daily.     . OMEGA-3 FATTY ACIDS PO Take 1,200 mg by mouth 2 (two) times daily.     No current facility-administered medications for this visit.    PHYSICAL EXAMINATION: ECOG PERFORMANCE STATUS: 1 - Symptomatic but completely ambulatory  Today's Vitals   05/07/19 1055  BP: (!) 140/52  Pulse: 95  Resp: 18  Temp: 97.6 F (36.4 C)  TempSrc: Temporal  SpO2: 96%  Weight: 209 lb 6.4 oz (95 kg)   Height: 5\' 9"  (1.753 m)  PainSc: 0-No pain   Body mass index is 30.92 kg/m.  Filed Weights   05/07/19 1055  Weight: 209 lb 6.4 oz (95 kg)    GENERAL: alert, no distress and comfortable SKIN: a few old bruises over bilateral forearms  EYES: conjunctiva are pink and non-injected, sclera clear OROPHARYNX: no exudate, no erythema; lips, buccal mucosa, and tongue normal  NECK: supple, non-tender LUNGS: clear to auscultation with normal breathing effort HEART: regular rate & rhythm and no murmurs and no lower extremity edema ABDOMEN: soft, non-tender, non-distended, normal bowel sounds Musculoskeletal: no cyanosis of digits and no clubbing  PSYCH: alert & oriented x 3, fluent speech  LABORATORY DATA:  I have reviewed the data as listed    Component Value Date/Time   NA  144 05/07/2019 1036   NA 142 11/11/2017 0754   K 4.2 05/07/2019 1036   CL 110 05/07/2019 1036   CO2 28 05/07/2019 1036   GLUCOSE 97 05/07/2019 1036   GLUCOSE 97 11/15/2005 0820   BUN 19 05/07/2019 1036   BUN 21 11/11/2017 0754   CREATININE 1.17 05/07/2019 1036   CREATININE 1.14 (H) 02/28/2015 1541   CALCIUM 9.5 05/07/2019 1036   PROT 6.6 05/07/2019 1036   PROT 6.6 10/28/2017 0738   ALBUMIN 4.1 05/07/2019 1036   ALBUMIN 4.1 10/28/2017 0738   AST 16 05/07/2019 1036   ALT 16 05/07/2019 1036   ALKPHOS 42 05/07/2019 1036   BILITOT 0.4 05/07/2019 1036   GFRNONAA 56 (L) 05/07/2019 1036   GFRAA >60 05/07/2019 1036    No results found for: SPEP, UPEP  Lab Results  Component Value Date   WBC 5.9 05/07/2019   NEUTROABS 3.3 05/07/2019   HGB 13.3 05/07/2019   HCT 39.8 05/07/2019   MCV 95.9 05/07/2019   PLT 182 05/07/2019      Chemistry      Component Value Date/Time   NA 144 05/07/2019 1036   NA 142 11/11/2017 0754   K 4.2 05/07/2019 1036   CL 110 05/07/2019 1036   CO2 28 05/07/2019 1036   BUN 19 05/07/2019 1036   BUN 21 11/11/2017 0754   CREATININE 1.17 05/07/2019 1036   CREATININE 1.14 (H)  02/28/2015 1541      Component Value Date/Time   CALCIUM 9.5 05/07/2019 1036   ALKPHOS 42 05/07/2019 1036   AST 16 05/07/2019 1036   ALT 16 05/07/2019 1036   BILITOT 0.4 05/07/2019 1036       RADIOGRAPHIC STUDIES: I have personally reviewed the radiological images as listed below and agreed with the findings in the report. No results found.

## 2019-07-08 IMAGING — DX DG FINGER RING 2+V*R*
3 series · 3 of 3 positions shown · non-contrast
Comparison: None.

CLINICAL DATA: Fall.  Laceration

EXAM:
RIGHT RING FINGER 2+V

[finger ap]
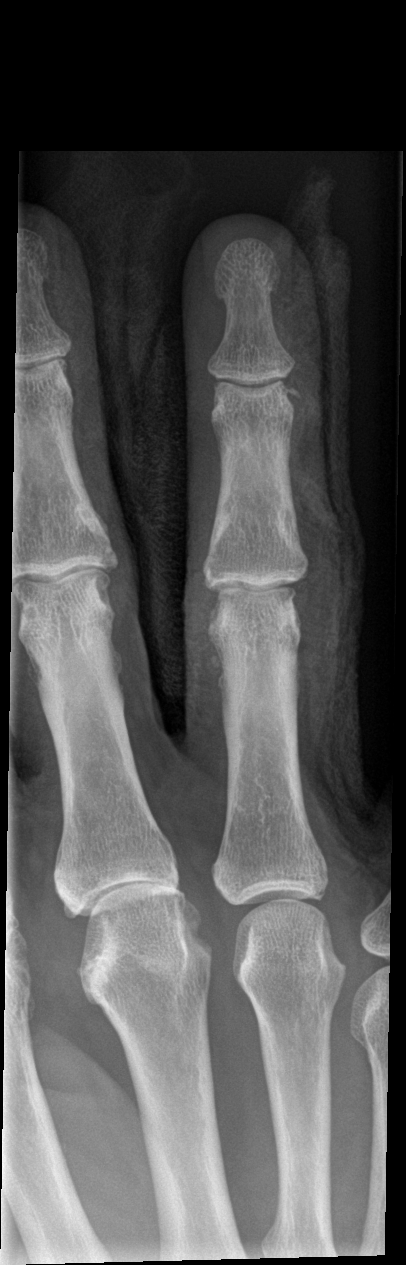

[finger obl]
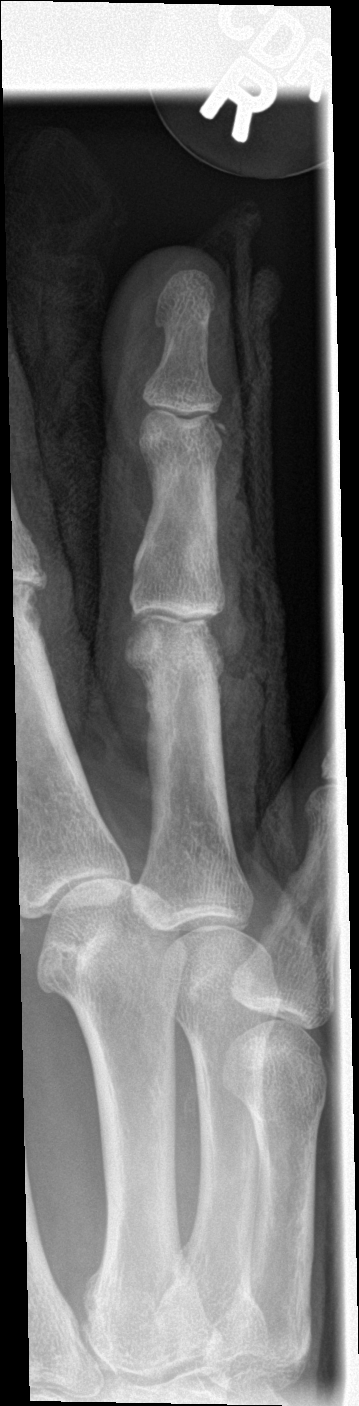

[finger lat]
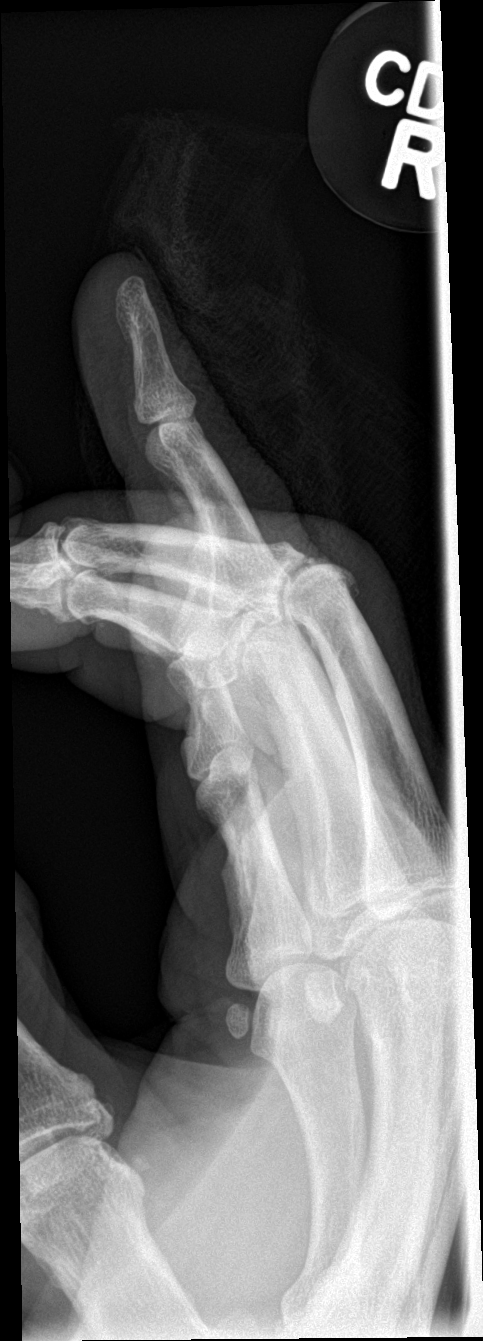

[3 of 3 positions shown; findings below may reference images not displayed]

FINDINGS: Soft tissue laceration. Small bone fragment adjacent to the DIP
joint medially. This could be acute or chronic avulsion fracture.
Joint space normal.
IMPRESSION: Soft tissue laceration

Small avulsion fracture the DIP joint which could be acute or
chronic.

## 2019-08-06 ENCOUNTER — Other Ambulatory Visit: Payer: Self-pay

## 2019-08-06 ENCOUNTER — Encounter: Payer: Self-pay | Admitting: Family

## 2019-08-06 ENCOUNTER — Inpatient Hospital Stay (HOSPITAL_BASED_OUTPATIENT_CLINIC_OR_DEPARTMENT_OTHER): Payer: Medicare Other | Admitting: Family

## 2019-08-06 ENCOUNTER — Telehealth: Payer: Self-pay | Admitting: Family

## 2019-08-06 ENCOUNTER — Inpatient Hospital Stay: Payer: Medicare Other | Attending: Hematology & Oncology

## 2019-08-06 VITALS — BP 134/58 | HR 62 | Temp 97.9°F | Resp 18 | Ht 69.0 in | Wt 211.0 lb

## 2019-08-06 DIAGNOSIS — I82402 Acute embolism and thrombosis of unspecified deep veins of left lower extremity: Secondary | ICD-10-CM

## 2019-08-06 DIAGNOSIS — Z7901 Long term (current) use of anticoagulants: Secondary | ICD-10-CM | POA: Diagnosis not present

## 2019-08-06 LAB — CMP (CANCER CENTER ONLY)
ALT: 18 U/L (ref 0–44)
AST: 19 U/L (ref 15–41)
Albumin: 4.3 g/dL (ref 3.5–5.0)
Alkaline Phosphatase: 47 U/L (ref 38–126)
Anion gap: 7 (ref 5–15)
BUN: 25 mg/dL — ABNORMAL HIGH (ref 8–23)
CO2: 27 mmol/L (ref 22–32)
Calcium: 9.8 mg/dL (ref 8.9–10.3)
Chloride: 108 mmol/L (ref 98–111)
Creatinine: 1.32 mg/dL — ABNORMAL HIGH (ref 0.61–1.24)
GFR, Est AFR Am: 56 mL/min — ABNORMAL LOW (ref >60)
GFR, Estimated: 48 mL/min — ABNORMAL LOW (ref >60)
Glucose, Bld: 96 mg/dL (ref 70–99)
Potassium: 4.3 mmol/L (ref 3.5–5.1)
Sodium: 142 mmol/L (ref 135–145)
Total Bilirubin: 0.4 mg/dL (ref 0.3–1.2)
Total Protein: 6.8 g/dL (ref 6.5–8.1)

## 2019-08-06 LAB — CBC WITH DIFFERENTIAL (CANCER CENTER ONLY)
Abs Immature Granulocytes: 0.05 10*3/uL (ref 0.00–0.07)
Basophils Absolute: 0 10*3/uL (ref 0.0–0.1)
Basophils Relative: 1 %
Eosinophils Absolute: 0.3 10*3/uL (ref 0.0–0.5)
Eosinophils Relative: 5 %
HCT: 40.4 % (ref 39.0–52.0)
Hemoglobin: 13.6 g/dL (ref 13.0–17.0)
Immature Granulocytes: 1 %
Lymphocytes Relative: 30 %
Lymphs Abs: 1.8 10*3/uL (ref 0.7–4.0)
MCH: 33.1 pg (ref 26.0–34.0)
MCHC: 33.7 g/dL (ref 30.0–36.0)
MCV: 98.3 fL (ref 80.0–100.0)
Monocytes Absolute: 0.6 10*3/uL (ref 0.1–1.0)
Monocytes Relative: 10 %
Neutro Abs: 3.3 10*3/uL (ref 1.7–7.7)
Neutrophils Relative %: 53 %
Platelet Count: 172 10*3/uL (ref 150–400)
RBC: 4.11 MIL/uL — ABNORMAL LOW (ref 4.22–5.81)
RDW: 12.6 % (ref 11.5–15.5)
WBC Count: 6.1 10*3/uL (ref 4.0–10.5)
nRBC: 0 % (ref 0.0–0.2)

## 2019-08-06 LAB — D-DIMER, QUANTITATIVE: D-Dimer, Quant: 0.82 ug/mL-FEU — ABNORMAL HIGH (ref 0.00–0.50)

## 2019-08-06 MED ORDER — APIXABAN 2.5 MG PO TABS
2.5000 mg | ORAL_TABLET | Freq: Two times a day (BID) | ORAL | 11 refills | Status: DC
Start: 2019-08-06 — End: 2019-08-18

## 2019-08-06 NOTE — Telephone Encounter (Signed)
Appointments scheduled calendar printed & mailed per 7/29 los

## 2019-08-06 NOTE — Progress Notes (Signed)
Hematology and Oncology Follow Up Visit  Luke [name] [id number] 1930-08-01 86 y.o. 08/06/2019   Principle Diagnosis:  Recurrent unprovoked LLE DVT  01/2019: extensive occlusive and non-occlusive DVT from mid-femoral to  popliteal and peroneal veins in the left lower extremity    Current Therapy: Eliquis 5 mg PO BID - decreased to maintenance 2.5 mg PO BID on 08/06/2019, lifelong  Interim History:  Luke [name] is here today for follow-up. Luke [name] is doing well on Eliquis and has had no issues with abnormal bleeding. Luke [name] does bruise easily.  Luke [name] is still quite active golfing several days a week with friends.  Luke [name] plans to get measured for a compression stocking that zips up for the left leg.  D-dimer is down to 0.78 from 2.83.  No fever, chills, n/v, cough, rash, dizziness, chest pain, palpitations, abdominal pain or changes in bowel or bladder habits.  Luke [name] has numbness and tingling in the left arm which Luke [name] states has been there for 6 month or so. Luke [name] states that Luke [name] has spoken with his PCP.  No swelling noted in his extremities at this time. Pedal pulses are 2+.  No falls or syncope.  Luke [name] has maintained a good appetite and is staying well hydrated. His weight is stable.   ECOG Performance Status: 1 - Symptomatic but completely ambulatory  Medications:  Allergies as of 08/06/2019      Reactions   Statins Other (See Comments)   Muscles Cramping      Medication List       Accurate as of August 06, 2019 11:51 AM. If you have any questions, ask your nurse or doctor.        amLODipine 10 MG tablet Commonly known as: NORVASC Take 1 tablet (10 mg total) by mouth daily.   aspirin 81 MG tablet Take 81 mg by mouth daily.   atorvastatin 10 MG tablet Commonly known as: LIPITOR TAKE 1 TABLET BY MOUTH EVERY MONDAY, WEDNESDAY AND FRIDAY   benazepril 40 MG tablet Commonly known as: LOTENSIN Take 40 mg by mouth daily.   CENTRUM SILVER PO Take 1 tablet by mouth daily.   Coenzyme Q10 200 MG  Tabs Take 1 tablet by mouth daily.   diclofenac sodium 1 % Gel Commonly known as: VOLTAREN Apply 2 g topically 4 (four) times daily.   Eliquis 5 MG Tabs tablet Generic drug: apixaban Take 5 mg by mouth 2 (two) times daily.   ezetimibe 10 MG tablet Commonly known as: ZETIA Take 1 tablet (10 mg total) by mouth daily.   glucosamine-chondroitin 500-400 MG tablet Take 1 tablet by mouth daily.   Krill Oil 500 MG Caps Take 500 mg by mouth daily.   metoprolol tartrate 25 MG tablet Commonly known as: LOPRESSOR Take 1 tablet (25 mg total) by mouth 2 (two) times daily.   OMEGA-3 FATTY ACIDS PO Take 1,200 mg by mouth 2 (two) times daily.       Allergies:  Allergies  Allergen Reactions  . Statins Other (See Comments)    Muscles Cramping    Past Medical History, Surgical history, Social history, and Family History were reviewed and updated.  Review of Systems: All other 10 point review of systems is negative.   Physical Exam:  vitals were not taken for this visit.   Wt Readings from Last 3 Encounters:  05/07/19 209 lb 6.4 oz (95 kg)  02/24/19 208 lb (94.3 kg)  02/02/19 205 lb 3.2 oz (93.1 kg)    Ocular: Sclerae unicteric,  pupils equal, round and reactive to light Ear-nose-throat: Oropharynx clear, dentition fair Lymphatic: No cervical or supraclavicular adenopathy Lungs no rales or rhonchi, good excursion bilaterally Heart regular rate and rhythm, no murmur appreciated Abd soft, nontender, positive bowel sounds, no liver or spleen tip palpated on exam, no fluid wave  MSK no focal spinal tenderness, no joint edema Neuro: non-focal, well-oriented, appropriate affect Breasts: Deferred   Lab Results  Component Value Date   WBC 6.1 08/06/2019   HGB 13.6 08/06/2019   HCT 40.4 08/06/2019   MCV 98.3 08/06/2019   PLT 172 08/06/2019   No results found for: FERRITIN, IRON, TIBC, UIBC, IRONPCTSAT Lab Results  Component Value Date   RBC 4.11 (L) 08/06/2019   No results  found for: KPAFRELGTCHN, LAMBDASER, KAPLAMBRATIO No results found for: IGGSERUM, IGA, IGMSERUM No results found for: Odetta Pink, SPEI   Chemistry      Component Value Date/Time   NA 142 08/06/2019 1057   NA 142 11/11/2017 0754   K 4.3 08/06/2019 1057   CL 108 08/06/2019 1057   CO2 27 08/06/2019 1057   BUN 25 (H) 08/06/2019 1057   BUN 21 11/11/2017 0754   CREATININE 1.32 (H) 08/06/2019 1057   CREATININE 1.14 (H) 02/28/2015 1541      Component Value Date/Time   CALCIUM 9.8 08/06/2019 1057   ALKPHOS 47 08/06/2019 1057   AST 19 08/06/2019 1057   ALT 18 08/06/2019 1057   BILITOT 0.4 08/06/2019 1057       Impression and Plan: Luke [name] is a very pleasant 86 yo caucasian gentleman with recurrent idiopathic left lower extremity DVT.  Luke [name] has completed 6 months of full dose Eliquis. We will now transition to a maintenance dose of 2.5 mg PO daily. lifelong.  Luke [name] verbalized understanding and will contact our office with any questions or concerns.  We will plan to see him again in another 3 months.   Laverna Peace, NP 7/29/202111:51 AM

## 2019-08-07 ENCOUNTER — Telehealth: Payer: Self-pay | Admitting: Cardiovascular Disease

## 2019-08-07 NOTE — Telephone Encounter (Signed)
New Message:    Pt would like to switch from Dr Burt Knack and go to our [facility] office to see Dr Harriet Masson. Is that alright with both of you?

## 2019-08-10 NOTE — Telephone Encounter (Signed)
That's fine with me. Thank you

## 2019-08-10 NOTE — Telephone Encounter (Signed)
That will be fine with me. 

## 2019-08-18 ENCOUNTER — Other Ambulatory Visit: Payer: Self-pay | Admitting: *Deleted

## 2019-08-18 DIAGNOSIS — I82402 Acute embolism and thrombosis of unspecified deep veins of left lower extremity: Secondary | ICD-10-CM

## 2019-08-18 MED ORDER — APIXABAN 2.5 MG PO TABS: 2.5000 mg | ORAL_TABLET | Freq: Two times a day (BID) | ORAL | 11 refills | Status: DC

## 2019-08-25 DIAGNOSIS — M545 Low back pain: Secondary | ICD-10-CM | POA: Diagnosis not present

## 2019-08-25 DIAGNOSIS — M546 Pain in thoracic spine: Secondary | ICD-10-CM | POA: Diagnosis not present

## 2019-08-25 DIAGNOSIS — M9902 Segmental and somatic dysfunction of thoracic region: Secondary | ICD-10-CM | POA: Diagnosis not present

## 2019-08-25 DIAGNOSIS — M9903 Segmental and somatic dysfunction of lumbar region: Secondary | ICD-10-CM | POA: Diagnosis not present

## 2019-08-27 DIAGNOSIS — M546 Pain in thoracic spine: Secondary | ICD-10-CM | POA: Diagnosis not present

## 2019-08-27 DIAGNOSIS — M545 Low back pain: Secondary | ICD-10-CM | POA: Diagnosis not present

## 2019-08-27 DIAGNOSIS — M9902 Segmental and somatic dysfunction of thoracic region: Secondary | ICD-10-CM | POA: Diagnosis not present

## 2019-08-27 DIAGNOSIS — M9903 Segmental and somatic dysfunction of lumbar region: Secondary | ICD-10-CM | POA: Diagnosis not present

## 2019-08-31 DIAGNOSIS — M9903 Segmental and somatic dysfunction of lumbar region: Secondary | ICD-10-CM | POA: Diagnosis not present

## 2019-08-31 DIAGNOSIS — M545 Low back pain: Secondary | ICD-10-CM | POA: Diagnosis not present

## 2019-08-31 DIAGNOSIS — M9902 Segmental and somatic dysfunction of thoracic region: Secondary | ICD-10-CM | POA: Diagnosis not present

## 2019-08-31 DIAGNOSIS — M546 Pain in thoracic spine: Secondary | ICD-10-CM | POA: Diagnosis not present

## 2019-09-06 ENCOUNTER — Other Ambulatory Visit: Payer: Self-pay | Admitting: Physician Assistant

## 2019-09-07 DIAGNOSIS — M9902 Segmental and somatic dysfunction of thoracic region: Secondary | ICD-10-CM | POA: Diagnosis not present

## 2019-09-07 DIAGNOSIS — M546 Pain in thoracic spine: Secondary | ICD-10-CM | POA: Diagnosis not present

## 2019-09-07 DIAGNOSIS — M9903 Segmental and somatic dysfunction of lumbar region: Secondary | ICD-10-CM | POA: Diagnosis not present

## 2019-09-07 DIAGNOSIS — M545 Low back pain: Secondary | ICD-10-CM | POA: Diagnosis not present

## 2019-09-08 NOTE — Telephone Encounter (Signed)
Pt switched doctors. Pt has an upcoming appt with Dr. Harriet Masson in [place]. Please address

## 2019-09-10 DIAGNOSIS — M9902 Segmental and somatic dysfunction of thoracic region: Secondary | ICD-10-CM | POA: Diagnosis not present

## 2019-09-10 DIAGNOSIS — M9903 Segmental and somatic dysfunction of lumbar region: Secondary | ICD-10-CM | POA: Diagnosis not present

## 2019-09-10 DIAGNOSIS — M546 Pain in thoracic spine: Secondary | ICD-10-CM | POA: Diagnosis not present

## 2019-09-10 DIAGNOSIS — M545 Low back pain: Secondary | ICD-10-CM | POA: Diagnosis not present

## 2019-09-15 DIAGNOSIS — M9903 Segmental and somatic dysfunction of lumbar region: Secondary | ICD-10-CM | POA: Diagnosis not present

## 2019-09-15 DIAGNOSIS — M9902 Segmental and somatic dysfunction of thoracic region: Secondary | ICD-10-CM | POA: Diagnosis not present

## 2019-09-15 DIAGNOSIS — M546 Pain in thoracic spine: Secondary | ICD-10-CM | POA: Diagnosis not present

## 2019-09-15 DIAGNOSIS — M545 Low back pain: Secondary | ICD-10-CM | POA: Diagnosis not present

## 2019-09-16 ENCOUNTER — Ambulatory Visit (INDEPENDENT_AMBULATORY_CARE_PROVIDER_SITE_OTHER): Payer: Medicare Other | Admitting: Cardiology

## 2019-09-16 ENCOUNTER — Encounter: Payer: Self-pay | Admitting: Cardiology

## 2019-09-16 ENCOUNTER — Other Ambulatory Visit: Payer: Self-pay

## 2019-09-16 VITALS — BP 140/62 | HR 64 | Ht 69.0 in | Wt 208.1 lb

## 2019-09-16 DIAGNOSIS — R739 Hyperglycemia, unspecified: Secondary | ICD-10-CM

## 2019-09-16 DIAGNOSIS — I25110 Atherosclerotic heart disease of native coronary artery with unstable angina pectoris: Secondary | ICD-10-CM

## 2019-09-16 DIAGNOSIS — E8881 Metabolic syndrome: Secondary | ICD-10-CM | POA: Diagnosis not present

## 2019-09-16 DIAGNOSIS — E785 Hyperlipidemia, unspecified: Secondary | ICD-10-CM | POA: Diagnosis not present

## 2019-09-16 DIAGNOSIS — I491 Atrial premature depolarization: Secondary | ICD-10-CM

## 2019-09-16 DIAGNOSIS — I10 Essential (primary) hypertension: Secondary | ICD-10-CM | POA: Diagnosis not present

## 2019-09-16 NOTE — Progress Notes (Signed)
Cardiology Office Note:    Date:  09/16/2019   ID:  Luke [name], DOB March 03, 1930, MRN [id number]  PCP:  Shelda Pal, DO  Cardiologist:  Berniece Salines, DO  Electrophysiologist:  None   Referring MD: Shelda Pal*   " I am switching over my care to you"  History of Present Illness:    Luke [name] is a 86 y.o. male with a hx of coronary artery disease, status post CABG over 15 years ago, underwent cardiac catheterization 2018 with all patent grafts, obesity, DVT on Eliquis and hyperlipidemia.    There is atrial fibrillation noted in his past medical history however the patient denies any history of A. fib.  I was also able to go back and review previous cardiology notes with does not list atrial fibrillation as a diagnosis.  The patient is here today for his first time visit with me although he is not new to our practice.  He is transferring care given the fact that he is in his primary cardiologist is doing a lot more procedures and does not have any office time.  Here for no complaints at this time.  He tells me that he is doing well from a cardiovascular standpoint.  He does have significant back pain which is his biggest problem.  Past Medical History:  Diagnosis Date  . Atrial fibrillation ([place])   . CAD (coronary artery disease)     5 with the left internal mamary to the left anterior  descending coronary artery.  [name] Kitchen DVT femoral (deep venous thrombosis) with thrombophlebitis ([place])   . Dyslipidemia   . HTN (hypertension)     Past Surgical History:  Procedure Laterality Date  . CORONARY ARTERY BYPASS GRAFT  2001  . I & D EXTREMITY Right 11/18/2016   Procedure: IRRIGATION AND DEBRIDEMENT RIGHT LONG AND RING FINGER;  Surgeon: Netta Cedars, MD;  Location: [facility];  Service: Orthopedics;  Laterality: Right;  . LEFT HEART CATH AND CORS/GRAFTS ANGIOGRAPHY N/A 07/19/2016   Procedure: Left Heart Cath and Cors/Grafts Angiography;  Surgeon: Jettie Booze,  MD;  Location: [facility];  Service: Cardiovascular;  Laterality: N/A;    Current Medications: Current Meds  Medication Sig  . amLODipine (NORVASC) 10 MG tablet TAKE 1 TABLET BY MOUTH  DAILY  . apixaban (ELIQUIS) 2.5 MG TABS tablet Take 1 tablet (2.5 mg total) by mouth 2 (two) times daily.  [name] Kitchen aspirin 81 MG tablet Take 81 mg by mouth daily.    [name] Kitchen atorvastatin (LIPITOR) 10 MG tablet TAKE 1 TABLET BY MOUTH EVERY MONDAY, WEDNESDAY AND FRIDAY  . benazepril (LOTENSIN) 40 MG tablet TAKE 1 TABLET BY MOUTH  DAILY  . Coenzyme Q10 200 MG TABS Take 1 tablet by mouth daily.  [name] Kitchen ezetimibe (ZETIA) 10 MG tablet TAKE 1 TABLET BY MOUTH  DAILY  . glucosamine-chondroitin 500-400 MG tablet Take 1 tablet by mouth daily.   Javier Docker Oil 500 MG CAPS Take 500 mg by mouth daily.  . metoprolol tartrate (LOPRESSOR) 25 MG tablet TAKE 1 TABLET BY MOUTH  TWICE DAILY  . Multiple Vitamins-Minerals (CENTRUM SILVER PO) Take 1 tablet by mouth daily.   . OMEGA-3 FATTY ACIDS PO Take 1,200 mg by mouth 2 (two) times daily.  . [DISCONTINUED] diclofenac sodium (VOLTAREN) 1 % GEL Apply 2 g topically 4 (four) times daily.     Allergies:   Statins   Social History   Socioeconomic History  . Marital status: Widowed  Spouse name: Not on file  . Number of children: Not on file  . Years of education: Not on file  . Highest education level: Not on file  Occupational History  . Not on file  Tobacco Use  . Smoking status: Former Research scientist (life sciences)  . Smokeless tobacco: Never Used  . Tobacco comment: stopped in the 1950's  Vaping Use  . Vaping Use: Never used  Substance and Sexual Activity  . Alcohol use: Yes    Alcohol/week: 0.0 standard drinks    Comment: occ  . Drug use: No  . Sexual activity: Not on file  Other Topics Concern  . Not on file  Social History Narrative   He lives in [place] with his wife.  He does not smoke.He drives about two hours a day which increases his risk for DVT.  He is retired.  He is a Psychologist, occupational  at [employer].          Social Determinants of Health   Financial Resource Strain:   . Difficulty of Paying Living Expenses: Not on file  Food Insecurity:   . Worried About Charity fundraiser in the Last Year: Not on file  . Ran Out of Food in the Last Year: Not on file  Transportation Needs:   . Lack of Transportation (Medical): Not on file  . Lack of Transportation (Non-Medical): Not on file  Physical Activity:   . Days of Exercise per Week: Not on file  . Minutes of Exercise per Session: Not on file  Stress:   . Feeling of Stress : Not on file  Social Connections:   . Frequency of Communication with Friends and Family: Not on file  . Frequency of Social Gatherings with Friends and Family: Not on file  . Attends Religious Services: Not on file  . Active Member of Clubs or Organizations: Not on file  . Attends Archivist Meetings: Not on file  . Marital Status: Not on file     Family History: The patient's family history includes Cancer in his mother; Coronary artery disease in an other family member; Heart attack in his father; Heart disease in his father and mother.  ROS:   Review of Systems  Constitution: Negative for decreased appetite, fever and weight gain.  HENT: Negative for congestion, ear discharge, hoarse voice and sore throat.   Eyes: Negative for discharge, redness, vision loss in right eye and visual halos.  Cardiovascular: Negative for chest pain, dyspnea on exertion, leg swelling, orthopnea and palpitations.  Respiratory: Negative for cough, hemoptysis, shortness of breath and snoring.   Endocrine: Negative for heat intolerance and polyphagia.  Hematologic/Lymphatic: Negative for bleeding problem. Does not bruise/bleed easily.  Skin: Negative for flushing, nail changes, rash and suspicious lesions.  Musculoskeletal: Negative for arthritis, joint pain, muscle cramps, myalgias, neck pain and stiffness.  Gastrointestinal:  Negative for abdominal pain, bowel incontinence, diarrhea and excessive appetite.  Genitourinary: Negative for decreased libido, genital sores and incomplete emptying.  Neurological: Negative for brief paralysis, focal weakness, headaches and loss of balance.  Psychiatric/Behavioral: Negative for altered mental status, depression and suicidal ideas.  Allergic/Immunologic: Negative for HIV exposure and persistent infections.    EKGs/Labs/Other Studies Reviewed:    The following studies were reviewed today:   EKG:  The ekg ordered today demonstrates sinus rhythm, heart rate 64 bpm, first-degree AV block and occasional PACs.   LHC   Ost Cx to Prox Cx lesion, 90 %stenosed.  Mid Cx lesion, 90 %  stenosed.  Ost 1st Mrg to 1st Mrg lesion, 90 %stenosed.  Patent jump graft SVG to OM1 and O2  Mid LAD lesion, 75 %stenosed. LIMA to LAD is widely patent.  Ost 1st Diag lesion, 100 %stenosed.  Ost 2nd Diag lesion, 75 %stenosed.  Patent jump graft SVG to first and second diagonals. Mild lesion in the proximal graft.  The left ventricular systolic function is normal.  LV end diastolic pressure is mildly elevated.  The left ventricular ejection fraction is 50-55% by visual estimate.  There is no aortic valve stenosis.  Would not use left radial approach in the future if cath was needed, due to tortuosity.  Continue aggressive secondary prevention   Recent Labs: 08/06/2019: ALT 18; BUN 25; Creatinine 1.32; Hemoglobin 13.6; Platelet Count 172; Potassium 4.3; Sodium 142  Recent Lipid Panel    Component Value Date/Time   CHOL 151 10/28/2017 0738   TRIG 120 10/28/2017 0738   TRIG 89 11/15/2005 0820   HDL 43 10/28/2017 0738   CHOLHDL 3.5 10/28/2017 0738   CHOLHDL 4.2 02/28/2015 1541   VLDL 23 02/28/2015 1541   LDLCALC 84 10/28/2017 0738   LDLDIRECT 139.9 06/18/2006 1008    Physical Exam:    VS:  BP 140/62   Pulse 64   Ht 5\' 9"  (1.753 m)   Wt 208 lb 1.9 oz (94.4 kg)   SpO2 96%    BMI 30.73 kg/m     Wt Readings from Last 3 Encounters:  09/16/19 208 lb 1.9 oz (94.4 kg)  08/06/19 (!) 211 lb (95.7 kg)  05/07/19 209 lb 6.4 oz (95 kg)     GEN: Well nourished, well developed in no acute distress HEENT: Normal NECK: No JVD; No carotid bruits LYMPHATICS: No lymphadenopathy CARDIAC: S1S2 noted,RRR, no murmurs, rubs, gallops RESPIRATORY:  Clear to auscultation without rales, wheezing or rhonchi  ABDOMEN: Soft, non-tender, non-distended, +bowel sounds, no guarding. EXTREMITIES: No edema, No cyanosis, no clubbing MUSCULOSKELETAL:  No deformity  SKIN: Warm and dry NEUROLOGIC:  Alert and oriented x 3, non-focal PSYCHIATRIC:  Normal affect, good insight  ASSESSMENT:    1. Hypertension, unspecified type   2. Hyperlipidemia, unspecified hyperlipidemia type   3. Coronary artery disease involving native coronary artery of native heart with unstable angina pectoris ([place])   4. Metabolic syndrome   5. Hyperglycemia, unspecified     PLAN:      He appears to be doing well from a cardiovascular standpoint.  Other than his back pain he tells me that he has no problem.   Coronary artery disease-no anginal symptoms continue patient his current medication regimen. Hypertension, continue patient on benazepril and Lopressor. Hyperlipidemia continue patient on atorvastatin as well as Zetia.  We will get lipid profile and if needed will readjust medication. Metabolic syndrome we will get hemoglobin A1c and lipid profile. The patient understands the need to lose weight with diet and exercise. We have discussed specific strategies for this. PACs are asymptomatic. The patient is in agreement with the above plan. The patient left the office in stable condition.  The patient will follow up in 6 months or sooner if needed.   Medication Adjustments/Labs and Tests Ordered: Current medicines are reviewed at length with the patient today.  Concerns regarding medicines are outlined above.    Orders Placed This Encounter  Procedures  . Lipid panel  . HgB A1c  . EKG 12-Lead   No orders of the defined types were placed in this encounter.   Patient Instructions  Medication Instructions:  Your physician recommends that you continue on your current medications as directed. Please refer to the Current Medication list given to you today.  *If you need a refill on your cardiac medications before your next appointment, please call your pharmacy*   Lab Work: Your physician recommends that you return for lab work in: Within the next week Lipids, HGB A1C. Please come in fasting to get these labs done.  If you have labs (blood work) drawn today and your tests are completely normal, you will receive your results only by: [name] Kitchen MyChart Message (if you have MyChart) OR . A paper copy in the mail If you have any lab test that is abnormal or we need to change your treatment, we will call you to review the results.   Testing/Procedures: None   Follow-Up: At [facility], you and your health needs are our priority.  As part of our continuing mission to provide you with exceptional heart care, we have created designated Provider Care Teams.  These Care Teams include your primary Cardiologist (physician) and Advanced Practice Providers (APPs -  Physician Assistants and Nurse Practitioners) who all work together to provide you with the care you need, when you need it.  We recommend signing up for the patient portal called "MyChart".  Sign up information is provided on this After Visit Summary.  MyChart is used to connect with patients for Virtual Visits (Telemedicine).  Patients are able to view lab/test results, encounter notes, upcoming appointments, etc.  Non-urgent messages can be sent to your provider as well.   To learn more about what you can do with MyChart, go to [email or web address].    Your next appointment:   6 month(s)  The format for your next appointment:   In  Person  Provider:   Berniece Salines, DO   Other Instructions      Adopting a Healthy Lifestyle.  Know what a healthy weight is for you (roughly BMI <25) and aim to maintain this   Aim for 7+ servings of fruits and vegetables daily   65-80+ fluid ounces of water or unsweet tea for healthy kidneys   Limit to max 1 drink of alcohol per day; avoid smoking/tobacco   Limit animal fats in diet for cholesterol and heart health - choose grass fed whenever available   Avoid highly processed foods, and foods high in saturated/trans fats   Aim for low stress - take time to unwind and care for your mental health   Aim for 150 min of moderate intensity exercise weekly for heart health, and weights twice weekly for bone health   Aim for 7-9 hours of sleep daily   When it comes to diets, agreement about the perfect plan isnt easy to find, even among the experts. Experts at the [employer] developed an idea known as the Healthy Eating Plate. Just imagine a plate divided into logical, healthy portions.   The emphasis is on diet quality:   Load up on vegetables and fruits - one-half of your plate: Aim for color and variety, and remember that potatoes dont count.   Go for whole grains - one-quarter of your plate: Whole wheat, barley, wheat berries, quinoa, oats, brown rice, and foods made with them. If you want pasta, go with whole wheat pasta.   Protein power - one-quarter of your plate: Fish, chicken, beans, and nuts are all healthy, versatile protein sources. Limit red meat.   The diet, however, does go beyond  the plate, offering a few other suggestions.   Use healthy plant oils, such as olive, canola, soy, corn, sunflower and peanut. Check the labels, and avoid partially hydrogenated oil, which have unhealthy trans fats.   If youre thirsty, drink water. Coffee and tea are good in moderation, but skip sugary drinks and limit milk and dairy products to one or two daily  servings.   The type of carbohydrate in the diet is more important than the amount. Some sources of carbohydrates, such as vegetables, fruits, whole grains, and beans-are healthier than others.   Finally, stay active  Signed, Berniece Salines, DO  09/16/2019 2:23 PM    [place]

## 2019-09-16 NOTE — Patient Instructions (Addendum)
Medication Instructions:  Your physician recommends that you continue on your current medications as directed. Please refer to the Current Medication list given to you today.  *If you need a refill on your cardiac medications before your next appointment, please call your pharmacy*   Lab Work: Your physician recommends that you return for lab work in: Within the next week Lipids, HGB A1C. Please come in fasting to get these labs done.  If you have labs (blood work) drawn today and your tests are completely normal, you will receive your results only by: [name] Kitchen MyChart Message (if you have MyChart) OR . A paper copy in the mail If you have any lab test that is abnormal or we need to change your treatment, we will call you to review the results.   Testing/Procedures: None   Follow-Up: At [facility], you and your health needs are our priority.  As part of our continuing mission to provide you with exceptional heart care, we have created designated Provider Care Teams.  These Care Teams include your primary Cardiologist (physician) and Advanced Practice Providers (APPs -  Physician Assistants and Nurse Practitioners) who all work together to provide you with the care you need, when you need it.  We recommend signing up for the patient portal called "MyChart".  Sign up information is provided on this After Visit Summary.  MyChart is used to connect with patients for Virtual Visits (Telemedicine).  Patients are able to view lab/test results, encounter notes, upcoming appointments, etc.  Non-urgent messages can be sent to your provider as well.   To learn more about what you can do with MyChart, go to [email or web address].    Your next appointment:   6 month(s)  The format for your next appointment:   In Person  Provider:   Berniece Salines, DO   Other Instructions

## 2019-09-23 DIAGNOSIS — I25110 Atherosclerotic heart disease of native coronary artery with unstable angina pectoris: Secondary | ICD-10-CM | POA: Diagnosis not present

## 2019-09-23 DIAGNOSIS — R739 Hyperglycemia, unspecified: Secondary | ICD-10-CM | POA: Diagnosis not present

## 2019-09-23 DIAGNOSIS — E785 Hyperlipidemia, unspecified: Secondary | ICD-10-CM | POA: Diagnosis not present

## 2019-09-23 DIAGNOSIS — I10 Essential (primary) hypertension: Secondary | ICD-10-CM | POA: Diagnosis not present

## 2019-09-23 DIAGNOSIS — E8881 Metabolic syndrome: Secondary | ICD-10-CM | POA: Diagnosis not present

## 2019-09-24 LAB — LIPID PANEL
Chol/HDL Ratio: 4.7 ratio (ref 0.0–5.0)
Cholesterol, Total: 161 mg/dL (ref 100–199)
HDL: 34 mg/dL — ABNORMAL LOW (ref >39)
LDL Chol Calc (NIH): 96 mg/dL (ref 0–99)
Triglycerides: 178 mg/dL — ABNORMAL HIGH (ref 0–149)
VLDL Cholesterol Cal: 31 mg/dL (ref 5–40)

## 2019-09-24 LAB — HEMOGLOBIN A1C
Est. average glucose Bld gHb Est-mCnc: 114 mg/dL
Hgb A1c MFr Bld: 5.6 % (ref 4.8–5.6)

## 2019-09-25 ENCOUNTER — Telehealth: Payer: Self-pay

## 2019-09-25 MED ORDER — ATORVASTATIN CALCIUM 20 MG PO TABS
20.0000 mg | ORAL_TABLET | ORAL | 3 refills | Status: DC
Start: 2019-09-25 — End: 2019-12-21

## 2019-09-25 NOTE — Telephone Encounter (Signed)
Spoke with patient regarding results and recommendation.  Patient verbalizes understanding and is agreeable to plan of care. Advised patient to call back with any issues or concerns.  

## 2019-09-25 NOTE — Telephone Encounter (Signed)
-----   Message from Berniece Salines, DO sent at 09/24/2019  5:46 PM EDT -----  Your LDL is above target, it should be less than 70 but is 96, your HDL is below 39 and also have elevated triglycerides.  I would like to increase your Lipitor to 20 mg for now.

## 2019-10-06 ENCOUNTER — Other Ambulatory Visit (HOSPITAL_BASED_OUTPATIENT_CLINIC_OR_DEPARTMENT_OTHER): Payer: Self-pay | Admitting: Internal Medicine

## 2019-10-06 ENCOUNTER — Ambulatory Visit: Payer: Medicare Other | Attending: Internal Medicine

## 2019-10-06 DIAGNOSIS — Z23 Encounter for immunization: Secondary | ICD-10-CM

## 2019-10-06 NOTE — Progress Notes (Signed)
   Covid-19 Vaccination Clinic  Name:  KALI AMBLER    MRN: [id number] DOB: May 07, 1930  10/06/2019  Mr. Degante was observed post Covid-19 immunization for 15 minutes without incident. He was provided with Vaccine Information Sheet and instruction to access the V-Safe system. Vaccinated By: Saunders Revel  Mr. Kiang was instructed to call 911 with any severe reactions post vaccine: [name] Kitchen Difficulty breathing  . Swelling of face and throat  . A fast heartbeat  . A bad rash all over body  . Dizziness and weakness

## 2019-10-09 MED FILL — PFIZER-BIONTECH COVID-19 VA: 30 | 1 days supply | Qty: 0 | Fill #0

## 2019-10-11 ENCOUNTER — Other Ambulatory Visit: Payer: Self-pay | Admitting: Physician Assistant

## 2019-10-12 NOTE — Telephone Encounter (Signed)
This is Dr. Harriet Masson pt

## 2019-10-13 ENCOUNTER — Other Ambulatory Visit: Payer: Self-pay | Admitting: Family

## 2019-10-13 ENCOUNTER — Telehealth: Payer: Self-pay

## 2019-10-13 NOTE — Telephone Encounter (Signed)
Wrong prescription refill sent in for patient's Atorvastatin, called and canceled the 10 mg prescription that was previously sent.  The pharmacy will be sending out the 20 mg as that is what on the patient's current medication regiment.

## 2019-10-26 ENCOUNTER — Other Ambulatory Visit: Payer: Self-pay | Admitting: Physician Assistant

## 2019-11-06 ENCOUNTER — Inpatient Hospital Stay (HOSPITAL_BASED_OUTPATIENT_CLINIC_OR_DEPARTMENT_OTHER): Payer: Medicare Other | Admitting: Family

## 2019-11-06 ENCOUNTER — Encounter: Payer: Self-pay | Admitting: Family

## 2019-11-06 ENCOUNTER — Other Ambulatory Visit: Payer: Self-pay

## 2019-11-06 ENCOUNTER — Inpatient Hospital Stay: Payer: Medicare Other | Attending: Hematology & Oncology

## 2019-11-06 VITALS — BP 154/66 | HR 77 | Temp 98.4°F | Resp 17 | Wt 213.0 lb

## 2019-11-06 DIAGNOSIS — Z7901 Long term (current) use of anticoagulants: Secondary | ICD-10-CM | POA: Diagnosis not present

## 2019-11-06 DIAGNOSIS — G8929 Other chronic pain: Secondary | ICD-10-CM | POA: Diagnosis not present

## 2019-11-06 DIAGNOSIS — I25110 Atherosclerotic heart disease of native coronary artery with unstable angina pectoris: Secondary | ICD-10-CM | POA: Diagnosis not present

## 2019-11-06 DIAGNOSIS — I82492 Acute embolism and thrombosis of other specified deep vein of left lower extremity: Secondary | ICD-10-CM | POA: Diagnosis not present

## 2019-11-06 DIAGNOSIS — I82402 Acute embolism and thrombosis of unspecified deep veins of left lower extremity: Secondary | ICD-10-CM | POA: Diagnosis not present

## 2019-11-06 DIAGNOSIS — M549 Dorsalgia, unspecified: Secondary | ICD-10-CM | POA: Diagnosis not present

## 2019-11-06 DIAGNOSIS — Z9181 History of falling: Secondary | ICD-10-CM | POA: Insufficient documentation

## 2019-11-06 LAB — CMP (CANCER CENTER ONLY)
ALT: 17 U/L (ref 0–44)
AST: 19 U/L (ref 15–41)
Albumin: 4.2 g/dL (ref 3.5–5.0)
Alkaline Phosphatase: 45 U/L (ref 38–126)
Anion gap: 7 (ref 5–15)
BUN: 22 mg/dL (ref 8–23)
CO2: 28 mmol/L (ref 22–32)
Calcium: 9.7 mg/dL (ref 8.9–10.3)
Chloride: 105 mmol/L (ref 98–111)
Creatinine: 1.24 mg/dL (ref 0.61–1.24)
GFR, Estimated: 56 mL/min — ABNORMAL LOW (ref >60)
Glucose, Bld: 93 mg/dL (ref 70–99)
Potassium: 4.2 mmol/L (ref 3.5–5.1)
Sodium: 140 mmol/L (ref 135–145)
Total Bilirubin: 0.3 mg/dL (ref 0.3–1.2)
Total Protein: 6.9 g/dL (ref 6.5–8.1)

## 2019-11-06 LAB — CBC WITH DIFFERENTIAL (CANCER CENTER ONLY)
Abs Immature Granulocytes: 0.05 10*3/uL (ref 0.00–0.07)
Basophils Absolute: 0.1 10*3/uL (ref 0.0–0.1)
Basophils Relative: 1 %
Eosinophils Absolute: 0.3 10*3/uL (ref 0.0–0.5)
Eosinophils Relative: 4 %
HCT: 40.4 % (ref 39.0–52.0)
Hemoglobin: 13.6 g/dL (ref 13.0–17.0)
Immature Granulocytes: 1 %
Lymphocytes Relative: 26 %
Lymphs Abs: 1.7 10*3/uL (ref 0.7–4.0)
MCH: 32.9 pg (ref 26.0–34.0)
MCHC: 33.7 g/dL (ref 30.0–36.0)
MCV: 97.8 fL (ref 80.0–100.0)
Monocytes Absolute: 0.6 10*3/uL (ref 0.1–1.0)
Monocytes Relative: 9 %
Neutro Abs: 3.9 10*3/uL (ref 1.7–7.7)
Neutrophils Relative %: 59 %
Platelet Count: 159 10*3/uL (ref 150–400)
RBC: 4.13 MIL/uL — ABNORMAL LOW (ref 4.22–5.81)
RDW: 12.1 % (ref 11.5–15.5)
WBC Count: 6.6 10*3/uL (ref 4.0–10.5)
nRBC: 0 % (ref 0.0–0.2)

## 2019-11-06 LAB — D-DIMER, QUANTITATIVE: D-Dimer, Quant: 0.83 ug/mL-FEU — ABNORMAL HIGH (ref 0.00–0.50)

## 2019-11-06 NOTE — Progress Notes (Signed)
Hematology and Oncology Follow Up Visit  Luke [name] [id number] 06-08-1930 86 y.o. 11/06/2019   Principle Diagnosis:  Recurrent unprovoked LLE DVT             01/2019: extensive occlusive and non-occlusive DVT from mid-femoral to  popliteal and peroneal veins in the left lower extremity               Current Therapy: Eliquis 5 mg PO BID - decreased to maintenance 2.5 mg PO BID on 08/06/2019, lifelong   Interim History:  Luke [name] is here today for follow-up. He is doing fairly well but did have a fall 3 weeks ago. Thankfully he was not seriously injured but it did exacerbate his chronic back pain.  He has rested as needed and plans to start trying to golf again next week.  No syncope.  No fever, chills, n/v, cough, rash, dizziness, SOB, chest pain, palpitations, abdominal pain or changes in bowel or bladder habits. D-dimer is 0.83.  He states that he has had a hard time with his insurance for coverage of the Eliquis. He is applying for supplemental coverage so hopefully this will go through soon.  No episodes of bleeding. He bruises easily on his arms. No petechiae.  No swelling in his extremities at this time. He wears his compression stockings regularly for added support. Pedal pulses are 2+. He states that his left arms goes to sleep every now and again and that moving it around resolves his symptoms.  He has maintained a good appetite and is staying well hydrated. His weight is stable.   ECOG Performance Status: 1 - Symptomatic but completely ambulatory  Medications:  Allergies as of 11/06/2019      Reactions   Statins Other (See Comments)   Muscles Cramping      Medication List       Accurate as of November 06, 2019  1:21 PM. If you have any questions, ask your nurse or doctor.        amLODipine 10 MG tablet Commonly known as: NORVASC TAKE 1 TABLET BY MOUTH  DAILY   apixaban 2.5 MG Tabs tablet Commonly known as: Eliquis Take 1 tablet (2.5 mg total) by mouth 2  (two) times daily.   aspirin 81 MG tablet Take 81 mg by mouth daily.   atorvastatin 20 MG tablet Commonly known as: LIPITOR Take 1 tablet (20 mg total) by mouth 3 (three) times a week.   atorvastatin 10 MG tablet Commonly known as: LIPITOR TAKE 1 TABLET BY MOUTH ON  MONDAY, WEDNESDAY, AND  FRIDAY   benazepril 40 MG tablet Commonly known as: LOTENSIN TAKE 1 TABLET BY MOUTH  DAILY   CENTRUM SILVER PO Take 1 tablet by mouth daily.   Coenzyme Q10 200 MG Tabs Take 1 tablet by mouth daily.   ezetimibe 10 MG tablet Commonly known as: ZETIA TAKE 1 TABLET BY MOUTH  DAILY   glucosamine-chondroitin 500-400 MG tablet Take 1 tablet by mouth daily.   Krill Oil 500 MG Caps Take 500 mg by mouth daily.   metoprolol tartrate 25 MG tablet Commonly known as: LOPRESSOR TAKE 1 TABLET BY MOUTH  TWICE DAILY   OMEGA-3 FATTY ACIDS PO Take 1,200 mg by mouth 2 (two) times daily.       Allergies:  Allergies  Allergen Reactions  . Statins Other (See Comments)    Muscles Cramping    Past Medical History, Surgical history, Social history, and Family History were reviewed and updated.  Review  of Systems: All other 10 point review of systems is negative.   Physical Exam:  vitals were not taken for this visit.   Wt Readings from Last 3 Encounters:  09/16/19 208 lb 1.9 oz (94.4 kg)  08/06/19 (!) 211 lb (95.7 kg)  05/07/19 209 lb 6.4 oz (95 kg)    Ocular: Sclerae unicteric, pupils equal, round and reactive to light Ear-nose-throat: Oropharynx clear, dentition fair Lymphatic: No cervical or supraclavicular adenopathy Lungs no rales or rhonchi, good excursion bilaterally Heart regular rate and rhythm, no murmur appreciated Abd soft, nontender, positive bowel sounds MSK no focal spinal tenderness, no joint edema Neuro: non-focal, well-oriented, appropriate affect Breasts: Deferred   Lab Results  Component Value Date   WBC 6.6 11/06/2019   HGB 13.6 11/06/2019   HCT 40.4  11/06/2019   MCV 97.8 11/06/2019   PLT 159 11/06/2019   No results found for: FERRITIN, IRON, TIBC, UIBC, IRONPCTSAT Lab Results  Component Value Date   RBC 4.13 (L) 11/06/2019   No results found for: KPAFRELGTCHN, LAMBDASER, KAPLAMBRATIO No results found for: IGGSERUM, IGA, IGMSERUM No results found for: Odetta Pink, SPEI   Chemistry      Component Value Date/Time   NA 142 08/06/2019 1057   NA 142 11/11/2017 0754   K 4.3 08/06/2019 1057   CL 108 08/06/2019 1057   CO2 27 08/06/2019 1057   BUN 25 (H) 08/06/2019 1057   BUN 21 11/11/2017 0754   CREATININE 1.32 (H) 08/06/2019 1057   CREATININE 1.14 (H) 02/28/2015 1541      Component Value Date/Time   CALCIUM 9.8 08/06/2019 1057   ALKPHOS 47 08/06/2019 1057   AST 19 08/06/2019 1057   ALT 18 08/06/2019 1057   BILITOT 0.4 08/06/2019 1057       Impression and Plan: Luke [name] is a very pleasant 86 yo caucasian gentleman with 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  He is doing well maintenance Eliquis and will continue his same regimen.  Follow-up in 6 months.  He was encouraged to contact our office with any questions or concerns.   Laverna Peace, NP 10/29/20211:21 PM

## 2019-11-07 DIAGNOSIS — Z23 Encounter for immunization: Secondary | ICD-10-CM | POA: Diagnosis not present

## 2019-11-09 ENCOUNTER — Telehealth: Payer: Self-pay | Admitting: Hematology & Oncology

## 2019-11-09 NOTE — Telephone Encounter (Signed)
Appointments scheduled calendar printed & mailed per 10/29 los 

## 2019-11-13 ENCOUNTER — Telehealth: Payer: Self-pay | Admitting: Family Medicine

## 2019-11-13 NOTE — Telephone Encounter (Signed)
Called the patient informed to let [facility] know to send [place] a PA form///we then will complete form and fax back to them to be authorized.

## 2019-11-13 NOTE — Telephone Encounter (Signed)
Patient states that [facility] is requesting an approval from pcp to be prescribe  apixaban (ELIQUIS) 2.5 MG  Please fax preauth over to [phone].[name] KitchenPLAN: Patient would like to be called to confirm if this can be done.

## 2019-11-13 NOTE — Telephone Encounter (Signed)
The patient did verbalized understanding.

## 2019-12-21 ENCOUNTER — Telehealth: Payer: Self-pay | Admitting: Cardiology

## 2019-12-21 MED ORDER — METOPROLOL TARTRATE 25 MG PO TABS: 25.0000 mg | ORAL_TABLET | Freq: Two times a day (BID) | ORAL | 3 refills | Status: DC

## 2019-12-21 MED ORDER — BENAZEPRIL HCL 40 MG PO TABS: 40.0000 mg | ORAL_TABLET | Freq: Every day | ORAL | 3 refills | Status: DC

## 2019-12-21 MED ORDER — ATORVASTATIN CALCIUM 20 MG PO TABS: 20.0000 mg | ORAL_TABLET | ORAL | 3 refills | Status: DC

## 2019-12-21 MED ORDER — EZETIMIBE 10 MG PO TABS: 10.0000 mg | ORAL_TABLET | Freq: Every day | ORAL | 3 refills | Status: DC

## 2019-12-21 MED ORDER — AMLODIPINE BESYLATE 10 MG PO TABS
10.0000 mg | ORAL_TABLET | Freq: Every day | ORAL | 3 refills | Status: DC
Start: 2019-12-21 — End: 2021-01-03

## 2019-12-21 NOTE — Telephone Encounter (Signed)
Refills sent in per request 

## 2019-12-21 NOTE — Telephone Encounter (Signed)
*  STAT* If patient is at the pharmacy, call can be transferred to refill team.   1. Which medications need to be refilled? (please list name of each medication and dose if known)  ezetimibe (ZETIA) 10 MG tablet  benazepril (LOTENSIN) 40 MG tablet metoprolol tartrate (LOPRESSOR) 25 MG tablet atorvastatin (LIPITOR) 20 MG tablet amLODipine (NORVASC) 10 MG tablet   2. Which pharmacy/location (including street and city if local pharmacy) is medication to be sent to?   [facility]ia [facility] to[id number] [place] [place]  [facility]  3. Do they need a 30 day or 90 day supply? 90 day

## 2019-12-29 DIAGNOSIS — H04123 Dry eye syndrome of bilateral lacrimal glands: Secondary | ICD-10-CM | POA: Diagnosis not present

## 2019-12-29 DIAGNOSIS — Z961 Presence of intraocular lens: Secondary | ICD-10-CM | POA: Diagnosis not present

## 2020-01-18 ENCOUNTER — Other Ambulatory Visit: Payer: Self-pay

## 2020-01-18 DIAGNOSIS — I82402 Acute embolism and thrombosis of unspecified deep veins of left lower extremity: Secondary | ICD-10-CM

## 2020-01-18 NOTE — Telephone Encounter (Signed)
New Rx [facility] received [facility] [place] for Eliquis forward to [facility] [employer] to review

## 2020-01-18 NOTE — Addendum Note (Signed)
Addended by: Allean Found on: 01/18/2020 02:46 PM   Modules accepted: Orders

## 2020-01-18 NOTE — Telephone Encounter (Signed)
67m, 96.6kg, scr 1.32 (11/06/19). PT REQUEST ELIQUIS 2.5 REFILL QUALIFIES FOR 5MG  BASED ON WEIGHT AND SCR WILL ROUTE TO PHARMD POOL FOR CLARIFICATION

## 2020-01-20 MED ORDER — APIXABAN 2.5 MG PO TABS: 2.5000 mg | ORAL_TABLET | Freq: Two times a day (BID) | ORAL | 11 refills | Status: DC

## 2020-01-20 NOTE — Telephone Encounter (Signed)
Indication is DVT, not afib, ok to fill 2.5mg  BID dosing.

## 2020-02-29 ENCOUNTER — Ambulatory Visit: Payer: Medicare Other | Admitting: *Deleted

## 2020-03-02 NOTE — Progress Notes (Signed)
Subjective:   EUFEMIO STRAHM is a 87 y.o. male who presents for an Initial Medicare Annual Wellness Visit.  Review of Systems     Cardiac Risk Factors include: advanced age (>3men, >53 women);hypertension;dyslipidemia;obesity (BMI >30kg/m2)     Objective:    Today's Vitals   03/03/20 1358  BP: 118/70  Pulse: 61  Resp: 16  Temp: 97.9 F (36.6 C)  TempSrc: Oral  SpO2: 96%  Weight: 205 lb 6.4 oz (93.2 kg)  Height: 5\' 9"  (1.753 m)   Body mass index is 30.33 kg/m.  Advanced Directives 03/03/2020 11/06/2019 08/06/2019 05/07/2019 02/06/2019 01/30/2019 06/23/2018  Does Patient Have a Medical Advance Directive? Yes Yes Yes Yes No No Yes  Type of Paramedic of [place];Living will Living will;Healthcare Power of [place];Living will [place];Living will Living will;Healthcare Power of [place]  Does patient want to make changes to medical advance directive? - No - Patient declined No - Patient declined No - Patient declined - - -  Copy of [place] in Chart? No - copy requested - No - copy requested No - copy requested No - copy requested - -  Would patient like information on creating a medical advance directive? - - No - Patient declined No - Patient declined No - Patient declined - -    Current Medications (verified) Outpatient Encounter Medications as of 03/03/2020  Medication Sig  . amLODipine (NORVASC) 10 MG tablet Take 1 tablet (10 mg total) by mouth daily.  [name] Kitchen apixaban (ELIQUIS) 2.5 MG TABS tablet Take 1 tablet (2.5 mg total) by mouth 2 (two) times daily.  [name] Kitchen aspirin 81 MG tablet Take 81 mg by mouth daily.  [name] Kitchen atorvastatin (LIPITOR) 20 MG tablet Take 1 tablet (20 mg total) by mouth 3 (three) times a week.  . benazepril (LOTENSIN) 40 MG tablet Take 1 tablet (40 mg total) by mouth daily.  . Coenzyme Q10 200 MG TABS Take 1 tablet by mouth daily.  [name] Kitchen ezetimibe  (ZETIA) 10 MG tablet Take 1 tablet (10 mg total) by mouth daily.  [name] Kitchen glucosamine-chondroitin 500-400 MG tablet Take 1 tablet by mouth daily.   Javier Docker Oil 500 MG CAPS Take 500 mg by mouth daily.  . metoprolol tartrate (LOPRESSOR) 25 MG tablet Take 1 tablet (25 mg total) by mouth 2 (two) times daily.  . Multiple Vitamins-Minerals (CENTRUM SILVER PO) Take 1 tablet by mouth daily.   . OMEGA-3 FATTY ACIDS PO Take 1,200 mg by mouth 2 (two) times daily.   No facility-administered encounter medications on file as of 03/03/2020.    Allergies (verified) Statins   History: Past Medical History:  Diagnosis Date  . Atrial fibrillation ([place])   . CAD (coronary artery disease)     5 with the left internal mamary to the left anterior  descending coronary artery.  [name] Kitchen DVT femoral (deep venous thrombosis) with thrombophlebitis ([place])   . Dyslipidemia   . HTN (hypertension)    Past Surgical History:  Procedure Laterality Date  . CORONARY ARTERY BYPASS GRAFT  2001  . I & D EXTREMITY Right 11/18/2016   Procedure: IRRIGATION AND DEBRIDEMENT RIGHT LONG AND RING FINGER;  Surgeon: Netta Cedars, MD;  Location: [place] [facility];  Service: Orthopedics;  Laterality: Right;  . LEFT HEART CATH AND CORS/GRAFTS ANGIOGRAPHY N/A 07/19/2016   Procedure: Left Heart Cath and Cors/Grafts Angiography;  Surgeon: Jettie Booze, MD;  Location[facility] INVASIVE CV  LAB;  Service: Cardiovascular;  Laterality: N/A;   Family History  Problem Relation Age of Onset  . Heart disease Mother   . Cancer Mother   . Heart disease Father   . Heart attack Father   . Coronary artery disease Other    Social History   Socioeconomic History  . Marital status: Widowed    Spouse name: Not on file  . Number of children: Not on file  . Years of education: Not on file  . Highest education level: Not on file  Occupational History  . Not on file  Tobacco Use  . Smoking status: Former Research scientist (life sciences)  . Smokeless tobacco: Never Used  . Tobacco comment:  stopped in the 1950's  Vaping Use  . Vaping Use: Never used  Substance and Sexual Activity  . Alcohol use: Yes    Alcohol/week: 0.0 standard drinks    Comment: occ  . Drug use: No  . Sexual activity: Not on file  Other Topics Concern  . Not on file  Social History Narrative   He lives in [place] with his wife.  He does not smoke.He drives about two hours a day which increases his risk for DVT.  He is retired.  He is a Psychologist, occupational at [employer].          Social Determinants of Health   Financial Resource Strain: Low Risk   . Difficulty of Paying Living Expenses: Not hard at all  Food Insecurity: No Food Insecurity  . Worried About Charity fundraiser in the Last Year: Never true  . Ran Out of Food in the Last Year: Never true  Transportation Needs: No Transportation Needs  . Lack of Transportation (Medical): No  . Lack of Transportation (Non-Medical): No  Physical Activity: Inactive  . Days of Exercise per Week: 0 days  . Minutes of Exercise per Session: 0 min  Stress: Stress Concern Present  . Feeling of Stress : To some extent  Social Connections: Moderately Isolated  . Frequency of Communication with Friends and Family: More than three times a week  . Frequency of Social Gatherings with Friends and Family: Once a week  . Attends Religious Services: Never  . Active Member of Clubs or Organizations: Yes  . Attends Archivist Meetings: 1 to 4 times per year  . Marital Status: Widowed    Tobacco Counseling Counseling given: Not Answered Comment: stopped in the 1950's   Clinical Intake:  Pre-visit preparation completed: Yes  Pain : No/denies pain     Nutritional Status: BMI > 30  Obese Nutritional Risks: None Diabetes: No  How often do you need to have someone help you when you read instructions, pamphlets, or other written materials from your doctor or pharmacy?: 1 - Never  Diabetic?No  Interpreter Needed?: No  Information  entered by :: Caroleen Hamman LPN   Activities of Daily Living In your present state of health, do you have any difficulty performing the following activities: 03/03/2020  Hearing? N  Vision? N  Difficulty concentrating or making decisions? Y  Comment occasionally with names  Walking or climbing stairs? N  Dressing or bathing? N  Doing errands, shopping? N  Preparing Food and eating ? N  Using the Toilet? N  In the past six months, have you accidently leaked urine? Y  Comment occasionally  Do you have problems with loss of bowel control? Y  Comment occasionally  Managing your Medications? N  Managing your Finances? N  Housekeeping or managing your Housekeeping? N  Some recent data might be hidden    Patient Care Team: Shelda Pal, DO as PCP - General (Family Medicine) Berniece Salines, DO as PCP - Cardiology (Cardiology)  Indicate any recent Medical Services you may have received from other than [facility] providers in the past year (date may be approximate).     Assessment:   This is a routine wellness examination for [facility].  Hearing/Vision screen  Hearing Screening   125Hz  250Hz  500Hz  1000Hz  2000Hz  3000Hz  4000Hz  6000Hz  8000Hz   Right ear:           Left ear:           Comments: No issues  Vision Screening Comments: Last eye exam-02/2020  Dietary issues and exercise activities discussed: Current Exercise Habits: The patient does not participate in regular exercise at present (works in the yard sometimes), Exercise limited by: orthopedic condition(s) (arthritis)  Goals    . Patient Stated     Would like to lose more weight & continue playing golf      Depression Screen PHQ 2/9 Scores 03/03/2020  PHQ - 2 Score 1    Fall Risk Fall Risk  03/03/2020 02/02/2019 09/15/2015  Falls in the past year? 1 0 No  Comment - - [place] Telephone Survey: data to providers prior to load  Number falls in past yr: 0 0 -  Injury with Fall? 1 0 -  Risk for fall due to : History of  fall(s) - -  Follow up Falls prevention discussed - -    FALL RISK PREVENTION PERTAINING TO THE HOME:  Any stairs in or around the home? Yes  If so, are there any without handrails? No  Home free of loose throw rugs in walkways, pet beds, electrical cords, etc? Yes  Adequate lighting in your home to reduce risk of falls? Yes   ASSISTIVE DEVICES UTILIZED TO PREVENT FALLS:  Life alert? No  Use of a cane, walker or w/c? No  Grab bars in the bathroom? Yes  Shower chair or bench in shower? No  Elevated toilet seat or a handicapped toilet? No   TIMED UP AND GO:  Was the test performed? Yes .  Length of time to ambulate 10 feet: 10 sec.   Gait steady and fast without use of assistive device  Cognitive Function:Normal cognitive status assessed by direct observation by this Nurse Health Advisor. No abnormalities found.          Immunizations Immunization History  Administered Date(s) Administered  . Influenza, High Dose Seasonal PF 10/31/2016, 09/27/2017  . Influenza-Unspecified 11/07/2019  . PFIZER(Purple Top)SARS-COV-2 Vaccination 02/13/2019, 03/10/2019, 10/06/2019  . Pneumococcal Conjugate-13 12/07/2015  . Pneumococcal Polysaccharide-23 10/31/2016  . Td 01/09/2004  . Tdap 11/16/2016    TDAP status: Up to date  Flu Vaccine status: Up to date  Pneumococcal vaccine status: Up to date  Covid-19 vaccine status: Completed vaccines  Qualifies for Shingles Vaccine? Yes   Zostavax completed No   Shingrix Completed?: No.    Education has been provided regarding the importance of this vaccine. Patient has been advised to call insurance company to determine out of pocket expense if they have not yet received this vaccine. Advised may also receive vaccine at local pharmacy or Health Dept. Verbalized acceptance and understanding.  Screening Tests Health Maintenance  Topic Date Due  . TETANUS/TDAP  11/17/2026  . INFLUENZA VACCINE  Completed  . COVID-19 Vaccine  Completed  .  PNA vac Low Risk  Adult  Completed    Health Maintenance  There are no preventive care reminders to display for this patient.  Colorectal cancer screening: No longer required.   Lung Cancer Screening: (Low Dose CT Chest recommended if Age 8-80 years, 30 pack-year currently smoking OR have quit w/in 15years.) does not qualify.     Additional Screening:  Hepatitis C Screening: does not qualify  Vision Screening: Recommended annual ophthalmology exams for early detection of glaucoma and other disorders of the eye. Is the patient up to date with their annual eye exam?  Yes  Who is the provider or what is the name of the office in which the patient attends annual eye exams? Unsure of name   Dental Screening: Recommended annual dental exams for proper oral hygiene  Community Resource Referral / Chronic Care Management: CRR required this visit?  No   CCM required this visit?  No      Plan:     I have personally reviewed and noted the following in the patient's chart:   . Medical and social history . Use of alcohol, tobacco or illicit drugs  . Current medications and supplements . Functional ability and status . Nutritional status . Physical activity . Advanced directives . List of other physicians . Hospitalizations, surgeries, and ER visits in previous 12 months . Vitals . Screenings to include cognitive, depression, and falls . Referrals and appointments  In addition, I have reviewed and discussed with patient certain preventive protocols, quality metrics, and best practice recommendations. A written personalized care plan for preventive services as well as general preventive health recommendations were provided to patient.     Marta Antu, LPN   [id number]  Nurse Health Advisor  Nurse Notes: None

## 2020-03-03 ENCOUNTER — Other Ambulatory Visit: Payer: Self-pay

## 2020-03-03 ENCOUNTER — Ambulatory Visit (INDEPENDENT_AMBULATORY_CARE_PROVIDER_SITE_OTHER): Payer: Medicare Other

## 2020-03-03 VITALS — BP 118/70 | HR 61 | Temp 97.9°F | Resp 16 | Ht 69.0 in | Wt 205.4 lb

## 2020-03-03 DIAGNOSIS — Z0000 Encounter for general adult medical examination without abnormal findings: Secondary | ICD-10-CM

## 2020-03-03 NOTE — Patient Instructions (Signed)
Luke [name] KitchenMr. [name] , Thank you for taking time to come for your Medicare Wellness Visit. I appreciate your ongoing commitment to your health goals. Please review the following plan we discussed and let me know if I can assist you in the future.   Screening recommendations/referrals: Colonoscopy: No longer required Recommended yearly ophthalmology/optometry visit for glaucoma screening and checkup Recommended yearly dental visit for hygiene and checkup  Vaccinations: Influenza vaccine: Up to date Pneumococcal vaccine: Completed vaccines Tdap vaccine: Up to date- Due-11/17/2026 Shingles vaccine: Discuss with pharmacy Covid-19: Completed vaciines  Advanced directives: Please bring a copy for your chart  Conditions/risks identified: See problem list  Next appointment: Follow up in one year for your annual wellness visit.   Preventive Care 35 Years and Older, Male Preventive care refers to lifestyle choices and visits with your health care provider that can promote health and wellness. What does preventive care include?  A yearly physical exam. This is also called an annual well check.  Dental exams once or twice a year.  Routine eye exams. Ask your health care provider how often you should have your eyes checked.  Personal lifestyle choices, including:  Daily care of your teeth and gums.  Regular physical activity.  Eating a healthy diet.  Avoiding tobacco and drug use.  Limiting alcohol use.  Practicing safe sex.  Taking low doses of aspirin every day.  Taking vitamin and mineral supplements as recommended by your health care provider. What happens during an annual well check? The services and screenings done by your health care provider during your annual well check will depend on your age, overall health, lifestyle risk factors, and family history of disease. Counseling  Your health care provider may ask you questions about your:  Alcohol use.  Tobacco use.  Drug  use.  Emotional well-being.  Home and relationship well-being.  Sexual activity.  Eating habits.  History of falls.  Memory and ability to understand (cognition).  Work and work Statistician. Screening  You may have the following tests or measurements:  Height, weight, and BMI.  Blood pressure.  Lipid and cholesterol levels. These may be checked every 5 years, or more frequently if you are over 51 years old.  Skin check.  Lung cancer screening. You may have this screening every year starting at age 14 if you have a 30-pack-year history of smoking and currently smoke or have quit within the past 15 years.  Fecal occult blood test (FOBT) of the stool. You may have this test every year starting at age 2.  Flexible sigmoidoscopy or colonoscopy. You may have a sigmoidoscopy every 5 years or a colonoscopy every 10 years starting at age 22.  Prostate cancer screening. Recommendations will vary depending on your family history and other risks.  Hepatitis C blood test.  Hepatitis B blood test.  Sexually transmitted disease (STD) testing.  Diabetes screening. This is done by checking your blood sugar (glucose) after you have not eaten for a while (fasting). You may have this done every 1-3 years.  Abdominal aortic aneurysm (AAA) screening. You may need this if you are a current or former smoker.  Osteoporosis. You may be screened starting at age 80 if you are at high risk. Talk with your health care provider about your test results, treatment options, and if necessary, the need for more tests. Vaccines  Your health care provider may recommend certain vaccines, such as:  Influenza vaccine. This is recommended every year.  Tetanus, diphtheria, and acellular pertussis (Tdap,  Td) vaccine. You may need a Td booster every 10 years.  Zoster vaccine. You may need this after age 63.  Pneumococcal 13-valent conjugate (PCV13) vaccine. One dose is recommended after age  25.  Pneumococcal polysaccharide (PPSV23) vaccine. One dose is recommended after age 35. Talk to your health care provider about which screenings and vaccines you need and how often you need them. This information is not intended to replace advice given to you by your health care provider. Make sure you discuss any questions you have with your health care provider. Document Released: 01/21/2015 Document Revised: 09/14/2015 Document Reviewed: 10/26/2014 Elsevier Interactive Patient Education  2017 [place] Prevention in the Home Falls can cause injuries. They can happen to people of all ages. There are many things you can do to make your home safe and to help prevent falls. What can I do on the outside of my home?  Regularly fix the edges of walkways and driveways and fix any cracks.  Remove anything that might make you trip as you walk through a door, such as a raised step or threshold.  Trim any bushes or trees on the path to your home.  Use bright outdoor lighting.  Clear any walking paths of anything that might make someone trip, such as rocks or tools.  Regularly check to see if handrails are loose or broken. Make sure that both sides of any steps have handrails.  Any raised decks and porches should have guardrails on the edges.  Have any leaves, snow, or ice cleared regularly.  Use sand or salt on walking paths during winter.  Clean up any spills in your garage right away. This includes oil or grease spills. What can I do in the bathroom?  Use night lights.  Install grab bars by the toilet and in the tub and shower. Do not use towel bars as grab bars.  Use non-skid mats or decals in the tub or shower.  If you need to sit down in the shower, use a plastic, non-slip stool.  Keep the floor dry. Clean up any water that spills on the floor as soon as it happens.  Remove soap buildup in the tub or shower regularly.  Attach bath mats securely with double-sided  non-slip rug tape.  Do not have throw rugs and other things on the floor that can make you trip. What can I do in the bedroom?  Use night lights.  Make sure that you have a light by your bed that is easy to reach.  Do not use any sheets or blankets that are too big for your bed. They should not hang down onto the floor.  Have a firm chair that has side arms. You can use this for support while you get dressed.  Do not have throw rugs and other things on the floor that can make you trip. What can I do in the kitchen?  Clean up any spills right away.  Avoid walking on wet floors.  Keep items that you use a lot in easy-to-reach places.  If you need to reach something above you, use a strong step stool that has a grab bar.  Keep electrical cords out of the way.  Do not use floor polish or wax that makes floors slippery. If you must use wax, use non-skid floor wax.  Do not have throw rugs and other things on the floor that can make you trip. What can I do with my stairs?  Do not  leave any items on the stairs.  Make sure that there are handrails on both sides of the stairs and use them. Fix handrails that are broken or loose. Make sure that handrails are as long as the stairways.  Check any carpeting to make sure that it is firmly attached to the stairs. Fix any carpet that is loose or worn.  Avoid having throw rugs at the top or bottom of the stairs. If you do have throw rugs, attach them to the floor with carpet tape.  Make sure that you have a light switch at the top of the stairs and the bottom of the stairs. If you do not have them, ask someone to add them for you. What else can I do to help prevent falls?  Wear shoes that:  Do not have high heels.  Have rubber bottoms.  Are comfortable and fit you well.  Are closed at the toe. Do not wear sandals.  If you use a stepladder:  Make sure that it is fully opened. Do not climb a closed stepladder.  Make sure that both  sides of the stepladder are locked into place.  Ask someone to hold it for you, if possible.  Clearly mark and make sure that you can see:  Any grab bars or handrails.  First and last steps.  Where the edge of each step is.  Use tools that help you move around (mobility aids) if they are needed. These include:  Canes.  Walkers.  Scooters.  Crutches.  Turn on the lights when you go into a dark area. Replace any light bulbs as soon as they burn out.  Set up your furniture so you have a clear path. Avoid moving your furniture around.  If any of your floors are uneven, fix them.  If there are any pets around you, be aware of where they are.  Review your medicines with your doctor. Some medicines can make you feel dizzy. This can increase your chance of falling. Ask your doctor what other things that you can do to help prevent falls. This information is not intended to replace advice given to you by your health care provider. Make sure you discuss any questions you have with your health care provider. Document Released: 10/21/2008 Document Revised: 06/02/2015 Document Reviewed: 01/29/2014 Elsevier Interactive Patient Education  2017 [employer].

## 2020-03-25 DIAGNOSIS — I4891 Unspecified atrial fibrillation: Secondary | ICD-10-CM | POA: Insufficient documentation

## 2020-03-25 DIAGNOSIS — E785 Hyperlipidemia, unspecified: Secondary | ICD-10-CM | POA: Insufficient documentation

## 2020-03-25 DIAGNOSIS — I10 Essential (primary) hypertension: Secondary | ICD-10-CM | POA: Insufficient documentation

## 2020-03-25 DIAGNOSIS — I82419 Acute embolism and thrombosis of unspecified femoral vein: Secondary | ICD-10-CM | POA: Insufficient documentation

## 2020-03-28 ENCOUNTER — Ambulatory Visit (INDEPENDENT_AMBULATORY_CARE_PROVIDER_SITE_OTHER): Payer: Medicare Other | Admitting: Cardiology

## 2020-03-28 ENCOUNTER — Other Ambulatory Visit: Payer: Self-pay

## 2020-03-28 ENCOUNTER — Encounter: Payer: Self-pay | Admitting: Cardiology

## 2020-03-28 VITALS — BP 138/70 | HR 60 | Ht 69.0 in | Wt 212.0 lb

## 2020-03-28 DIAGNOSIS — Z951 Presence of aortocoronary bypass graft: Secondary | ICD-10-CM

## 2020-03-28 DIAGNOSIS — I10 Essential (primary) hypertension: Secondary | ICD-10-CM | POA: Diagnosis not present

## 2020-03-28 DIAGNOSIS — E782 Mixed hyperlipidemia: Secondary | ICD-10-CM | POA: Diagnosis not present

## 2020-03-28 DIAGNOSIS — I2510 Atherosclerotic heart disease of native coronary artery without angina pectoris: Secondary | ICD-10-CM | POA: Diagnosis not present

## 2020-03-28 NOTE — Patient Instructions (Signed)
Medication Instructions:  No medication changes. *If you need a refill on your cardiac medications before your next appointment, please call your pharmacy*   Lab Work: None ordered If you have labs (blood work) drawn today and your tests are completely normal, you will receive your results only by: [name] Kitchen MyChart Message (if you have MyChart) OR . A paper copy in the mail If you have any lab test that is abnormal or we need to change your treatment, we will call you to review the results.   Testing/Procedures: None ordered   Follow-Up: At [facility], you and your health needs are our priority.  As part of our continuing mission to provide you with exceptional heart care, we have created designated Provider Care Teams.  These Care Teams include your primary Cardiologist (physician) and Advanced Practice Providers (APPs -  Physician Assistants and Nurse Practitioners) who all work together to provide you with the care you need, when you need it.  We recommend signing up for the patient portal called "MyChart".  Sign up information is provided on this After Visit Summary.  MyChart is used to connect with patients for Virtual Visits (Telemedicine).  Patients are able to view lab/test results, encounter notes, upcoming appointments, etc.  Non-urgent messages can be sent to your provider as well.   To learn more about what you can do with MyChart, go to [email or web address].    Your next appointment:   1 year(s)  The format for your next appointment:   In Person  Provider:   Berniece Salines, DO   Other Instructions NA

## 2020-03-28 NOTE — Progress Notes (Signed)
Cardiology Office Note:    Date:  03/28/2020   ID:  Luke [name], DOB Dec 19, 1930, MRN [id number]  PCP:  Shelda Pal, DO  Cardiologist:  Berniece Salines, DO  Electrophysiologist:  None   Referring MD: Shelda Pal*   I am doing well  History of Present Illness:    Luke [name] is a 87 y.o. male with a hx of  hx of coronary artery disease, status post CABG over 15 years ago, underwent cardiac catheterization 2018 with all patent grafts, obesity, DVT on Eliquis and hyperlipidemia.    Last saw the patient on September 16, 2019 at that time he appeared to be doing well from a cardiovascular standpoint.  He is here today for follow-up visit.  He offers no complaints at this time.  Past Medical History:  Diagnosis Date  . Abnormal EKG   . Atrial fibrillation ([place])   . ATRIAL FIBRILLATION, HX OF 04/16/2008   Qualifier: Diagnosis of  By: Sidney Ace    . CAD (coronary artery disease)     5 with the left internal mamary to the left anterior  descending coronary artery.  . Chest pain 07/18/2016  . Coronary artery disease involving native coronary artery of native heart with unstable angina pectoris ([place])   . DEEP VENOUS THROMBOPHLEBITIS, HX OF 04/16/2008   Qualifier: Diagnosis of  By: Sidney Ace    . DVT (deep venous thrombosis) ([place]) 02/06/2019  . DVT femoral (deep venous thrombosis) with thrombophlebitis ([place])   . Dyslipidemia   . Dyslipidemia, goal LDL below 70 04/16/2008   Qualifier: Diagnosis of  By: Sidney Ace    . HTN (hypertension)   . Hx of CABG   . Hyperlipidemia 01/30/2012  . Hypertension 04/16/2008   Qualifier: Diagnosis of  By: Sidney Ace    . Open wound of right hand with tendon involvement 11/18/2016  . Osteoarthritis 01/30/2012  . 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 S/p Phaco/IOL OS with Alcon SN60WF 19.0D, wound zero, on 02/11/2012  . Unstable angina [facility])     Past Surgical  History:  Procedure Laterality Date  . CORONARY ARTERY BYPASS GRAFT  2001  . I & D EXTREMITY Right 11/18/2016   Procedure: IRRIGATION AND DEBRIDEMENT RIGHT LONG AND RING FINGER;  Surgeon: Netta Cedars, MD;  Location: [place];  Service: Orthopedics;  Laterality: Right;  . LEFT HEART CATH AND CORS/GRAFTS ANGIOGRAPHY N/A 07/19/2016   Procedure: Left Heart Cath and Cors/Grafts Angiography;  Surgeon: Jettie Booze, MD;  Location: [place] [facility];  Service: Cardiovascular;  Laterality: N/A;    Current Medications: Current Meds  Medication Sig  . amLODipine (NORVASC) 10 MG tablet Take 1 tablet (10 mg total) by mouth daily.  [name] Kitchen apixaban (ELIQUIS) 2.5 MG TABS tablet Take 1 tablet (2.5 mg total) by mouth 2 (two) times daily.  [name] Kitchen aspirin 81 MG tablet Take 81 mg by mouth daily.  [name] Kitchen atorvastatin (LIPITOR) 20 MG tablet Take 1 tablet (20 mg total) by mouth 3 (three) times a week.  . benazepril (LOTENSIN) 40 MG tablet Take 1 tablet (40 mg total) by mouth daily.  . Coenzyme Q10 200 MG TABS Take 1 tablet by mouth daily.  [name] Kitchen ezetimibe (ZETIA) 10 MG tablet Take 1 tablet (10 mg total) by mouth daily.  [name] Kitchen glucosamine-chondroitin 500-400 MG tablet Take 1 tablet by mouth daily.   Javier Docker Oil 500 MG CAPS Take 500 mg by mouth  daily.  . metoprolol tartrate (LOPRESSOR) 25 MG tablet Take 1 tablet (25 mg total) by mouth 2 (two) times daily.  . Multiple Vitamins-Minerals (CENTRUM SILVER PO) Take 1 tablet by mouth daily.   . OMEGA-3 FATTY ACIDS PO Take 1,200 mg by mouth 2 (two) times daily.     Allergies:   Statins   Social History   Socioeconomic History  . Marital status: Widowed    Spouse name: Not on file  . Number of children: Not on file  . Years of education: Not on file  . Highest education level: Not on file  Occupational History  . Not on file  Tobacco Use  . Smoking status: Former Research scientist (life sciences)  . Smokeless tobacco: Never Used  . Tobacco comment: stopped in the 1950's  Vaping Use  . Vaping Use:  Never used  Substance and Sexual Activity  . Alcohol use: Yes    Alcohol/week: 0.0 standard drinks    Comment: occ  . Drug use: No  . Sexual activity: Not on file  Other Topics Concern  . Not on file  Social History Narrative   He lives in [place] with his wife.  He does not smoke.He drives about two hours a day which increases his risk for DVT.  He is retired.  He is a Psychologist, occupational at [employer].          Social Determinants of Health   Financial Resource Strain: Low Risk   . Difficulty of Paying Living Expenses: Not hard at all  Food Insecurity: No Food Insecurity  . Worried About Charity fundraiser in the Last Year: Never true  . Ran Out of Food in the Last Year: Never true  Transportation Needs: No Transportation Needs  . Lack of Transportation (Medical): No  . Lack of Transportation (Non-Medical): No  Physical Activity: Inactive  . Days of Exercise per Week: 0 days  . Minutes of Exercise per Session: 0 min  Stress: Stress Concern Present  . Feeling of Stress : To some extent  Social Connections: Moderately Isolated  . Frequency of Communication with Friends and Family: More than three times a week  . Frequency of Social Gatherings with Friends and Family: Once a week  . Attends Religious Services: Never  . Active Member of Clubs or Organizations: Yes  . Attends Archivist Meetings: 1 to 4 times per year  . Marital Status: Widowed     Family History: The patient's family history includes Cancer in his mother; Coronary artery disease in an other family member; Heart attack in his father; Heart disease in his father and mother.  ROS:   Review of Systems  Constitution: Negative for decreased appetite, fever and weight gain.  HENT: Negative for congestion, ear discharge, hoarse voice and sore throat.   Eyes: Negative for discharge, redness, vision loss in right eye and visual halos.  Cardiovascular: Negative for chest pain, dyspnea on  exertion, leg swelling, orthopnea and palpitations.  Respiratory: Negative for cough, hemoptysis, shortness of breath and snoring.   Endocrine: Negative for heat intolerance and polyphagia.  Hematologic/Lymphatic: Negative for bleeding problem. Does not bruise/bleed easily.  Skin: Negative for flushing, nail changes, rash and suspicious lesions.  Musculoskeletal: Negative for arthritis, joint pain, muscle cramps, myalgias, neck pain and stiffness.  Gastrointestinal: Negative for abdominal pain, bowel incontinence, diarrhea and excessive appetite.  Genitourinary: Negative for decreased libido, genital sores and incomplete emptying.  Neurological: Negative for brief paralysis, focal weakness, headaches and loss  of balance.  Psychiatric/Behavioral: Negative for altered mental status, depression and suicidal ideas.  Allergic/Immunologic: Negative for HIV exposure and persistent infections.    EKGs/Labs/Other Studies Reviewed:    The following studies were reviewed today:   EKG: None today  LHC   Ost Cx to Prox Cx lesion, 90 %stenosed.  Mid Cx lesion, 90 %stenosed.  Ost 1st Mrg to 1st Mrg lesion, 90 %stenosed.  Patent jump graft SVG to OM1 and O2  Mid LAD lesion, 75 %stenosed. LIMA to LAD is widely patent.  Ost 1st Diag lesion, 100 %stenosed.  Ost 2nd Diag lesion, 75 %stenosed.  Patent jump graft SVG to first and second diagonals. Mild lesion in the proximal graft.  The left ventricular systolic function is normal.  LV end diastolic pressure is mildly elevated.  The left ventricular ejection fraction is 50-55% by visual estimate.  There is no aortic valve stenosis.  Would not use left radial approach in the future if cath was needed, due to tortuosity. Continue aggressive secondary prevention  Recent Labs: 11/06/2019: ALT 17; BUN 22; Creatinine 1.24; Hemoglobin 13.6; Platelet Count 159; Potassium 4.2; Sodium 140  Recent Lipid Panel    Component Value Date/Time   CHOL  161 09/23/2019 0841   TRIG 178 (H) 09/23/2019 0841   TRIG 89 11/15/2005 0820   HDL 34 (L) 09/23/2019 0841   CHOLHDL 4.7 09/23/2019 0841   CHOLHDL 4.2 02/28/2015 1541   VLDL 23 02/28/2015 1541   LDLCALC 96 09/23/2019 0841   LDLDIRECT 139.9 06/18/2006 1008    Physical Exam:    VS:  BP 138/70   Pulse 60   Ht 5\' 9"  (1.753 m)   Wt 212 lb (96.2 kg)   SpO2 96%   BMI 31.31 kg/m     Wt Readings from Last 3 Encounters:  03/28/20 212 lb (96.2 kg)  03/03/20 205 lb 6.4 oz (93.2 kg)  11/06/19 213 lb (96.6 kg)     GEN: Well nourished, well developed in no acute distress HEENT: Normal NECK: No JVD; No carotid bruits LYMPHATICS: No lymphadenopathy CARDIAC: S1S2 noted,RRR, no murmurs, rubs, gallops RESPIRATORY:  Clear to auscultation without rales, wheezing or rhonchi  ABDOMEN: Soft, non-tender, non-distended, +bowel sounds, no guarding. EXTREMITIES: No edema, No cyanosis, no clubbing MUSCULOSKELETAL:  No deformity  SKIN: Warm and dry NEUROLOGIC:  Alert and oriented x 3, non-focal PSYCHIATRIC:  Normal affect, good insight  ASSESSMENT:    1. Coronary artery disease involving native coronary artery of native heart without angina pectoris   2. Primary hypertension   3. Mixed hyperlipidemia   4. Hx of CABG    PLAN:     He is doing well from a cardiovascular standpoint.  No changes will be made to his medication regimen.  His blood pressure deceptively in the office today.  No anginal symptoms.  Continue his current statin medication.  The patient is in agreement with the above plan. The patient left the office in stable condition.  The patient will follow up in 1 year or sooner if needed.   Medication Adjustments/Labs and Tests Ordered: Current medicines are reviewed at length with the patient today.  Concerns regarding medicines are outlined above.  No orders of the defined types were placed in this encounter.  No orders of the defined types were placed in this  encounter.   Patient Instructions  Medication Instructions:  No medication changes. *If you need a refill on your cardiac medications before your next appointment, please call your pharmacy*  Lab Work: None ordered If you have labs (blood work) drawn today and your tests are completely normal, you will receive your results only by: [name] Kitchen MyChart Message (if you have MyChart) OR . A paper copy in the mail If you have any lab test that is abnormal or we need to change your treatment, we will call you to review the results.   Testing/Procedures: None ordered   Follow-Up: At [facility], you and your health needs are our priority.  As part of our continuing mission to provide you with exceptional heart care, we have created designated Provider Care Teams.  These Care Teams include your primary Cardiologist (physician) and Advanced Practice Providers (APPs -  Physician Assistants and Nurse Practitioners) who all work together to provide you with the care you need, when you need it.  We recommend signing up for the patient portal called "MyChart".  Sign up information is provided on this After Visit Summary.  MyChart is used to connect with patients for Virtual Visits (Telemedicine).  Patients are able to view lab/test results, encounter notes, upcoming appointments, etc.  Non-urgent messages can be sent to your provider as well.   To learn more about what you can do with MyChart, go to [email or web address].    Your next appointment:   1 year(s)  The format for your next appointment:   In Person  Provider:   Berniece Salines, DO   Other Instructions NA     Adopting a Healthy Lifestyle.  Know what a healthy weight is for you (roughly BMI <25) and aim to maintain this   Aim for 7+ servings of fruits and vegetables daily   65-80+ fluid ounces of water or unsweet tea for healthy kidneys   Limit to max 1 drink of alcohol per day; avoid smoking/tobacco   Limit animal fats in  diet for cholesterol and heart health - choose grass fed whenever available   Avoid highly processed foods, and foods high in saturated/trans fats   Aim for low stress - take time to unwind and care for your mental health   Aim for 150 min of moderate intensity exercise weekly for heart health, and weights twice weekly for bone health   Aim for 7-9 hours of sleep daily   When it comes to diets, agreement about the perfect plan isnt easy to find, even among the experts. Experts at the [employer] developed an idea known as the Healthy Eating Plate. Just imagine a plate divided into logical, healthy portions.   The emphasis is on diet quality:   Load up on vegetables and fruits - one-half of your plate: Aim for color and variety, and remember that potatoes dont count.   Go for whole grains - one-quarter of your plate: Whole wheat, barley, wheat berries, quinoa, oats, brown rice, and foods made with them. If you want pasta, go with whole wheat pasta.   Protein power - one-quarter of your plate: Fish, chicken, beans, and nuts are all healthy, versatile protein sources. Limit red meat.   The diet, however, does go beyond the plate, offering a few other suggestions.   Use healthy plant oils, such as olive, canola, soy, corn, sunflower and peanut. Check the labels, and avoid partially hydrogenated oil, which have unhealthy trans fats.   If youre thirsty, drink water. Coffee and tea are good in moderation, but skip sugary drinks and limit milk and dairy products to one or two daily servings.   The type of  carbohydrate in the diet is more important than the amount. Some sources of carbohydrates, such as vegetables, fruits, whole grains, and beans-are healthier than others.   Finally, stay active  Signed, Berniece Salines, DO  03/28/2020 11:49 AM    [place]

## 2020-05-06 ENCOUNTER — Other Ambulatory Visit: Payer: Self-pay

## 2020-05-06 ENCOUNTER — Inpatient Hospital Stay: Payer: Medicare Other | Attending: Family

## 2020-05-06 ENCOUNTER — Inpatient Hospital Stay (HOSPITAL_BASED_OUTPATIENT_CLINIC_OR_DEPARTMENT_OTHER): Payer: Medicare Other | Admitting: Family

## 2020-05-06 VITALS — Ht 69.0 in | Wt 210.0 lb

## 2020-05-06 DIAGNOSIS — I82412 Acute embolism and thrombosis of left femoral vein: Secondary | ICD-10-CM | POA: Diagnosis not present

## 2020-05-06 DIAGNOSIS — I82402 Acute embolism and thrombosis of unspecified deep veins of left lower extremity: Secondary | ICD-10-CM

## 2020-05-06 DIAGNOSIS — I82452 Acute embolism and thrombosis of left peroneal vein: Secondary | ICD-10-CM | POA: Insufficient documentation

## 2020-05-06 DIAGNOSIS — I82432 Acute embolism and thrombosis of left popliteal vein: Secondary | ICD-10-CM | POA: Insufficient documentation

## 2020-05-06 DIAGNOSIS — Z7901 Long term (current) use of anticoagulants: Secondary | ICD-10-CM | POA: Diagnosis not present

## 2020-05-06 DIAGNOSIS — I2510 Atherosclerotic heart disease of native coronary artery without angina pectoris: Secondary | ICD-10-CM | POA: Diagnosis not present

## 2020-05-06 LAB — CBC WITH DIFFERENTIAL (CANCER CENTER ONLY)
Abs Immature Granulocytes: 0.04 10*3/uL (ref 0.00–0.07)
Basophils Absolute: 0 10*3/uL (ref 0.0–0.1)
Basophils Relative: 1 %
Eosinophils Absolute: 0.2 10*3/uL (ref 0.0–0.5)
Eosinophils Relative: 4 %
HCT: 41.5 % (ref 39.0–52.0)
Hemoglobin: 14.1 g/dL (ref 13.0–17.0)
Immature Granulocytes: 1 %
Lymphocytes Relative: 27 %
Lymphs Abs: 1.7 10*3/uL (ref 0.7–4.0)
MCH: 32.7 pg (ref 26.0–34.0)
MCHC: 34 g/dL (ref 30.0–36.0)
MCV: 96.3 fL (ref 80.0–100.0)
Monocytes Absolute: 0.6 10*3/uL (ref 0.1–1.0)
Monocytes Relative: 9 %
Neutro Abs: 3.8 10*3/uL (ref 1.7–7.7)
Neutrophils Relative %: 58 %
Platelet Count: 162 10*3/uL (ref 150–400)
RBC: 4.31 MIL/uL (ref 4.22–5.81)
RDW: 12.2 % (ref 11.5–15.5)
WBC Count: 6.4 10*3/uL (ref 4.0–10.5)
nRBC: 0 % (ref 0.0–0.2)

## 2020-05-06 LAB — CMP (CANCER CENTER ONLY)
ALT: 15 U/L (ref 0–44)
AST: 15 U/L (ref 15–41)
Albumin: 4.1 g/dL (ref 3.5–5.0)
Alkaline Phosphatase: 40 U/L (ref 38–126)
Anion gap: 7 (ref 5–15)
BUN: 33 mg/dL — ABNORMAL HIGH (ref 8–23)
CO2: 27 mmol/L (ref 22–32)
Calcium: 9.9 mg/dL (ref 8.9–10.3)
Chloride: 107 mmol/L (ref 98–111)
Creatinine: 1.34 mg/dL — ABNORMAL HIGH (ref 0.61–1.24)
GFR, Estimated: 51 mL/min — ABNORMAL LOW (ref >60)
Glucose, Bld: 90 mg/dL (ref 70–99)
Potassium: 4.8 mmol/L (ref 3.5–5.1)
Sodium: 141 mmol/L (ref 135–145)
Total Bilirubin: 0.4 mg/dL (ref 0.3–1.2)
Total Protein: 6.9 g/dL (ref 6.5–8.1)

## 2020-05-06 LAB — D-DIMER, QUANTITATIVE: D-Dimer, Quant: 0.89 ug/mL-FEU — ABNORMAL HIGH (ref 0.00–0.50)

## 2020-05-06 NOTE — Progress Notes (Signed)
Hematology and Oncology Follow Up Visit  Luke [name] [id number] [birth year] 87 y.o. 05/06/2020   Principle Diagnosis:  Recurrent unprovoked LLE DVT 01/2019: extensive occlusive and non-occlusive DVT from mid-femoral to popliteal and peroneal veins in the left lower extremity   Current Therapy: Eliquis 5 mg PO BID - decreased to maintenance 2.5 mg PO BID on 08/06/2019, lifelong   Interim History:  Luke [name] is here today for follow-up. He is doing fairly well. He has chronic back issues as well arthritis effecting his hands and knees.  He is still golfing twice a week if the weather permits.  No falls or syncope to report.  He has not noted any abnormal blood loss. The skin on his arms and legs is thin and he does bruise easily. No petechiae.  He has cramping at times in his legs due to the Lipitor. He states that eating mustard helps with this.  He has intermittent numbness and tingling in his left arm. He states that he has had this for years and that it is felt to be due to his back problems.  No fever, chills, n/v, cough, rash, dizziness, SOB, chest pain, palpitations, abdominal pain or changes in bowel or bladder habits.  He has maintained a good appetite and is staying well hydrated. His weight is stable at 210 lbs.   ECOG Performance Status: 1 - Symptomatic but completely ambulatory  Medications:  Allergies as of 05/06/2020      Reactions   Statins Other (See Comments)   Muscles Cramping      Medication List       Accurate as of May 06, 2020  1:33 PM. If you have any questions, ask your nurse or doctor.        STOP taking these medications   Pfizer-BioNTech COVID-19 Vacc 30 MCG/0.3ML injection Generic drug: COVID-19 mRNA vaccine Therapist, music) Stopped by: Laverna Peace, NP     TAKE these medications   amLODipine 10 MG tablet Commonly known as: NORVASC Take 1 tablet (10 mg total) by mouth daily.   apixaban 2.5 MG Tabs tablet Commonly  known as: Eliquis Take 1 tablet (2.5 mg total) by mouth 2 (two) times daily.   aspirin 81 MG tablet Take 81 mg by mouth daily.   atorvastatin 20 MG tablet Commonly known as: LIPITOR Take 1 tablet (20 mg total) by mouth 3 (three) times a week.   benazepril 40 MG tablet Commonly known as: LOTENSIN Take 1 tablet (40 mg total) by mouth daily.   CENTRUM SILVER PO Take 1 tablet by mouth daily.   Coenzyme Q10 200 MG Tabs Take 1 tablet by mouth daily.   ezetimibe 10 MG tablet Commonly known as: ZETIA Take 1 tablet (10 mg total) by mouth daily.   glucosamine-chondroitin 500-400 MG tablet Take 1 tablet by mouth daily.   Krill Oil 500 MG Caps Take 500 mg by mouth daily.   metoprolol tartrate 25 MG tablet Commonly known as: LOPRESSOR Take 1 tablet (25 mg total) by mouth 2 (two) times daily.   OMEGA-3 FATTY ACIDS PO Take 1,200 mg by mouth 2 (two) times daily.       Allergies:  Allergies  Allergen Reactions  . Statins Other (See Comments)    Muscles Cramping    Past Medical History, Surgical history, Social history, and Family History were reviewed and updated.  Review of Systems: All other 10 point review of systems is negative.   Physical Exam:  height is 5\' 9"  (1.753 m) and  weight is 210 lb (95.3 kg).   Wt Readings from Last 3 Encounters:  05/06/20 210 lb (95.3 kg)  03/28/20 212 lb (96.2 kg)  03/03/20 205 lb 6.4 oz (93.2 kg)    Ocular: Sclerae unicteric, pupils equal, round and reactive to light Ear-nose-throat: Oropharynx clear, dentition fair Lymphatic: No cervical or supraclavicular adenopathy Lungs no rales or rhonchi, good excursion bilaterally Heart regular rate and rhythm, no murmur appreciated Abd soft, nontender, positive bowel sounds MSK no focal spinal tenderness, no joint edema Neuro: non-focal, well-oriented, appropriate affect Breasts: Deferred   Lab Results  Component Value Date   WBC 6.4 05/06/2020   HGB 14.1 05/06/2020   HCT 41.5  05/06/2020   MCV 96.3 05/06/2020   PLT 162 05/06/2020   No results found for: FERRITIN, IRON, TIBC, UIBC, IRONPCTSAT Lab Results  Component Value Date   RBC 4.31 05/06/2020   No results found for: KPAFRELGTCHN, LAMBDASER, KAPLAMBRATIO No results found for: IGGSERUM, IGA, IGMSERUM No results found for: Odetta Pink, SPEI   Chemistry      Component Value Date/Time   NA 140 11/06/2019 1308   NA 142 11/11/2017 0754   K 4.2 11/06/2019 1308   CL 105 11/06/2019 1308   CO2 28 11/06/2019 1308   BUN 22 11/06/2019 1308   BUN 21 11/11/2017 0754   CREATININE 1.24 11/06/2019 1308   CREATININE 1.14 (H) 02/28/2015 1541      Component Value Date/Time   CALCIUM 9.7 11/06/2019 1308   ALKPHOS 45 11/06/2019 1308   AST 19 11/06/2019 1308   ALT 17 11/06/2019 1308   BILITOT 0.3 11/06/2019 1308       Impression and Plan: Luke [name] is a very pleasant 87 yo caucasian gentleman with 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  He has done well and will continue his same regimen with Eliquis.  Follow-up in 6 months.  He can contact our office with any questions or concerns.   Laverna Peace, NP 4/29/20221:33 PM

## 2020-06-22 ENCOUNTER — Telehealth: Payer: Self-pay

## 2020-10-04 DIAGNOSIS — Z23 Encounter for immunization: Secondary | ICD-10-CM | POA: Diagnosis not present

## 2020-11-04 ENCOUNTER — Ambulatory Visit: Payer: Medicare Other | Admitting: Hematology & Oncology

## 2020-11-04 ENCOUNTER — Encounter: Payer: Self-pay | Admitting: Family

## 2020-11-04 ENCOUNTER — Inpatient Hospital Stay (HOSPITAL_BASED_OUTPATIENT_CLINIC_OR_DEPARTMENT_OTHER): Payer: Medicare Other | Admitting: Family

## 2020-11-04 ENCOUNTER — Other Ambulatory Visit: Payer: Self-pay

## 2020-11-04 ENCOUNTER — Other Ambulatory Visit: Payer: Medicare Other

## 2020-11-04 ENCOUNTER — Inpatient Hospital Stay: Payer: Medicare Other | Attending: Hematology & Oncology

## 2020-11-04 VITALS — BP 176/70 | HR 51 | Temp 98.1°F | Resp 18 | Wt 208.0 lb

## 2020-11-04 DIAGNOSIS — I82402 Acute embolism and thrombosis of unspecified deep veins of left lower extremity: Secondary | ICD-10-CM

## 2020-11-04 DIAGNOSIS — I82492 Acute embolism and thrombosis of other specified deep vein of left lower extremity: Secondary | ICD-10-CM | POA: Insufficient documentation

## 2020-11-04 DIAGNOSIS — Z7901 Long term (current) use of anticoagulants: Secondary | ICD-10-CM | POA: Diagnosis not present

## 2020-11-04 LAB — CMP (CANCER CENTER ONLY)
ALT: 18 U/L (ref 0–44)
AST: 19 U/L (ref 15–41)
Albumin: 4.3 g/dL (ref 3.5–5.0)
Alkaline Phosphatase: 46 U/L (ref 38–126)
Anion gap: 9 (ref 5–15)
BUN: 27 mg/dL — ABNORMAL HIGH (ref 8–23)
CO2: 25 mmol/L (ref 22–32)
Calcium: 9.9 mg/dL (ref 8.9–10.3)
Chloride: 109 mmol/L (ref 98–111)
Creatinine: 1.27 mg/dL — ABNORMAL HIGH (ref 0.61–1.24)
GFR, Estimated: 54 mL/min — ABNORMAL LOW (ref >60)
Glucose, Bld: 94 mg/dL (ref 70–99)
Potassium: 4.7 mmol/L (ref 3.5–5.1)
Sodium: 143 mmol/L (ref 135–145)
Total Bilirubin: 0.4 mg/dL (ref 0.3–1.2)
Total Protein: 7 g/dL (ref 6.5–8.1)

## 2020-11-04 LAB — CBC WITH DIFFERENTIAL (CANCER CENTER ONLY)
Abs Immature Granulocytes: 0.03 10*3/uL (ref 0.00–0.07)
Basophils Absolute: 0.1 10*3/uL (ref 0.0–0.1)
Basophils Relative: 1 %
Eosinophils Absolute: 0.3 10*3/uL (ref 0.0–0.5)
Eosinophils Relative: 5 %
HCT: 40.1 % (ref 39.0–52.0)
Hemoglobin: 13.4 g/dL (ref 13.0–17.0)
Immature Granulocytes: 1 %
Lymphocytes Relative: 28 %
Lymphs Abs: 1.8 10*3/uL (ref 0.7–4.0)
MCH: 32.9 pg (ref 26.0–34.0)
MCHC: 33.4 g/dL (ref 30.0–36.0)
MCV: 98.5 fL (ref 80.0–100.0)
Monocytes Absolute: 0.5 10*3/uL (ref 0.1–1.0)
Monocytes Relative: 7 %
Neutro Abs: 3.7 10*3/uL (ref 1.7–7.7)
Neutrophils Relative %: 58 %
Platelet Count: 166 10*3/uL (ref 150–400)
RBC: 4.07 MIL/uL — ABNORMAL LOW (ref 4.22–5.81)
RDW: 12.8 % (ref 11.5–15.5)
WBC Count: 6.3 10*3/uL (ref 4.0–10.5)
nRBC: 0 % (ref 0.0–0.2)

## 2020-11-04 LAB — D-DIMER, QUANTITATIVE: D-Dimer, Quant: 0.66 ug/mL-FEU — ABNORMAL HIGH (ref 0.00–0.50)

## 2020-11-04 NOTE — Progress Notes (Signed)
Hematology and Oncology Follow Up Visit  Luke [name] [id number] 02/21/1930 87 y.o. 11/05/2020   Principle Diagnosis:  Recurrent unprovoked LLE DVT             01/2019: extensive occlusive and non-occlusive DVT from mid-femoral to  popliteal and peroneal veins in the left lower extremity               Current Therapy: Eliquis 5 mg PO BID - decreased to maintenance 2.5 mg PO BID on 08/06/2019, lifelong   Interim History:  Luke [name] is here today for follow-up. He is under some stress at home but is doing fairly well.  He continues to tolerated maintenance Eliquis nicely.  No obvious blood loss noted. He does bruise easily on his arms. No petechiae noted.  No fever, chills, n/v, cough, rash, dizziness, SOB, chest pain, palpitations, abdominal pain or changes in bowel or bladder habits.  He did golf earlier this week but this flared up his arthritis.  No swelling, tenderness, numbness or tingling in his extremities at this time.  No falls or syncope to report.  He has maintained a good appetite and is staying well hydrated. His weight is stable at 208 lbs.   ECOG Performance Status: 1 - Symptomatic but completely ambulatory  Medications:  Allergies as of 11/04/2020       Reactions   Statins Other (See Comments)   Muscles Cramping        Medication List        Accurate as of November 04, 2020 11:59 PM. If you have any questions, ask your nurse or doctor.          amLODipine 10 MG tablet Commonly known as: NORVASC Take 1 tablet (10 mg total) by mouth daily.   apixaban 2.5 MG Tabs tablet Commonly known as: Eliquis Take 1 tablet (2.5 mg total) by mouth 2 (two) times daily.   aspirin 81 MG tablet Take 81 mg by mouth daily.   atorvastatin 20 MG tablet Commonly known as: LIPITOR Take 1 tablet (20 mg total) by mouth 3 (three) times a week.   benazepril 40 MG tablet Commonly known as: LOTENSIN Take 1 tablet (40 mg total) by mouth daily.   CENTRUM SILVER  PO Take 1 tablet by mouth daily.   Coenzyme Q10 200 MG Tabs Take 1 tablet by mouth daily.   ezetimibe 10 MG tablet Commonly known as: ZETIA Take 1 tablet (10 mg total) by mouth daily.   glucosamine-chondroitin 500-400 MG tablet Take 1 tablet by mouth daily.   Krill Oil 500 MG Caps Take 500 mg by mouth daily.   metoprolol tartrate 25 MG tablet Commonly known as: LOPRESSOR Take 1 tablet (25 mg total) by mouth 2 (two) times daily.   OMEGA-3 FATTY ACIDS PO Take 1,200 mg by mouth 2 (two) times daily.        Allergies:  Allergies  Allergen Reactions   Statins Other (See Comments)    Muscles Cramping    Past Medical History, Surgical history, Social history, and Family History were reviewed and updated.  Review of Systems: All other 10 point review of systems is negative.   Physical Exam:  weight is 208 lb (94.3 kg). His oral temperature is 98.1 F (36.7 C). His blood pressure is 176/70 (abnormal) and his pulse is 51 (abnormal). His respiration is 18 and oxygen saturation is 99%.   Wt Readings from Last 3 Encounters:  11/04/20 208 lb (94.3 kg)  05/06/20 210 lb (95.3  kg)  03/28/20 212 lb (96.2 kg)    Ocular: Sclerae unicteric, pupils equal, round and reactive to light Ear-nose-throat: Oropharynx clear, dentition fair Lymphatic: No cervical or supraclavicular adenopathy Lungs no rales or rhonchi, good excursion bilaterally Heart regular rate and rhythm, no murmur appreciated Abd soft, nontender, positive bowel sounds MSK no focal spinal tenderness, no joint edema Neuro: non-focal, well-oriented, appropriate affect Breasts: Deferred   Lab Results  Component Value Date   WBC 6.3 11/04/2020   HGB 13.4 11/04/2020   HCT 40.1 11/04/2020   MCV 98.5 11/04/2020   PLT 166 11/04/2020   No results found for: FERRITIN, IRON, TIBC, UIBC, IRONPCTSAT Lab Results  Component Value Date   RBC 4.07 (L) 11/04/2020   No results found for: KPAFRELGTCHN, LAMBDASER,  KAPLAMBRATIO No results found for: IGGSERUM, IGA, IGMSERUM No results found for: Odetta Pink, SPEI   Chemistry      Component Value Date/Time   NA 143 11/04/2020 1438   NA 142 11/11/2017 0754   K 4.7 11/04/2020 1438   CL 109 11/04/2020 1438   CO2 25 11/04/2020 1438   BUN 27 (H) 11/04/2020 1438   BUN 21 11/11/2017 0754   CREATININE 1.27 (H) 11/04/2020 1438   CREATININE 1.14 (H) 02/28/2015 1541      Component Value Date/Time   CALCIUM 9.9 11/04/2020 1438   ALKPHOS 46 11/04/2020 1438   AST 19 11/04/2020 1438   ALT 18 11/04/2020 1438   BILITOT 0.4 11/04/2020 1438       Impression and Plan: Luke [name] is a very pleasant 86 yo caucasian gentleman with 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  He continues to do well. No changes to his regimen with Eliquis.  Follow-up in 6 months.  He can contact our office with any questions or concerns.   Lottie Dawson, NP 10/29/202210:09 PM

## 2020-11-07 ENCOUNTER — Telehealth: Payer: Self-pay | Admitting: *Deleted

## 2020-11-07 NOTE — Telephone Encounter (Signed)
Per 11/04/20 los - called and lvm of upcoming appointments - requested call back to confirm appointment

## 2020-11-11 ENCOUNTER — Other Ambulatory Visit: Payer: Medicare Other

## 2020-11-11 ENCOUNTER — Inpatient Hospital Stay: Payer: Medicare Other | Admitting: Hematology & Oncology

## 2020-11-11 ENCOUNTER — Inpatient Hospital Stay: Payer: Medicare Other

## 2020-11-15 DIAGNOSIS — M9901 Segmental and somatic dysfunction of cervical region: Secondary | ICD-10-CM | POA: Diagnosis not present

## 2020-11-15 DIAGNOSIS — M9904 Segmental and somatic dysfunction of sacral region: Secondary | ICD-10-CM | POA: Diagnosis not present

## 2020-11-15 DIAGNOSIS — M9903 Segmental and somatic dysfunction of lumbar region: Secondary | ICD-10-CM | POA: Diagnosis not present

## 2020-11-15 DIAGNOSIS — M9905 Segmental and somatic dysfunction of pelvic region: Secondary | ICD-10-CM | POA: Diagnosis not present

## 2020-11-17 ENCOUNTER — Other Ambulatory Visit: Payer: Self-pay | Admitting: Cardiology

## 2020-11-22 DIAGNOSIS — M9904 Segmental and somatic dysfunction of sacral region: Secondary | ICD-10-CM | POA: Diagnosis not present

## 2020-11-22 DIAGNOSIS — M9905 Segmental and somatic dysfunction of pelvic region: Secondary | ICD-10-CM | POA: Diagnosis not present

## 2020-11-22 DIAGNOSIS — M9901 Segmental and somatic dysfunction of cervical region: Secondary | ICD-10-CM | POA: Diagnosis not present

## 2020-11-22 DIAGNOSIS — M9903 Segmental and somatic dysfunction of lumbar region: Secondary | ICD-10-CM | POA: Diagnosis not present

## 2020-11-25 ENCOUNTER — Telehealth: Payer: Self-pay | Admitting: Cardiology

## 2020-11-25 MED ORDER — EZETIMIBE 10 MG PO TABS: 10.0000 mg | ORAL_TABLET | Freq: Every day | ORAL | 3 refills | Status: DC

## 2020-11-25 NOTE — Telephone Encounter (Signed)
Refill sent in per request.  

## 2020-11-25 NOTE — Telephone Encounter (Signed)
*  STAT* If patient is at the pharmacy, call can be transferred to refill team.   1. Which medications need to be refilled? (please list name of each medication and dose if known) ezetimibe (ZETIA) 10 MG tablet   2. Which pharmacy/location (including street and city if local pharmacy) is medication to be sent to? [id number] [place],  - [place]  3. Do they need a 30 day or 90 day supply? 90 ds

## 2020-12-06 DIAGNOSIS — M9903 Segmental and somatic dysfunction of lumbar region: Secondary | ICD-10-CM | POA: Diagnosis not present

## 2020-12-06 DIAGNOSIS — M9901 Segmental and somatic dysfunction of cervical region: Secondary | ICD-10-CM | POA: Diagnosis not present

## 2020-12-06 DIAGNOSIS — M9904 Segmental and somatic dysfunction of sacral region: Secondary | ICD-10-CM | POA: Diagnosis not present

## 2020-12-06 DIAGNOSIS — M9905 Segmental and somatic dysfunction of pelvic region: Secondary | ICD-10-CM | POA: Diagnosis not present

## 2020-12-13 DIAGNOSIS — M9904 Segmental and somatic dysfunction of sacral region: Secondary | ICD-10-CM | POA: Diagnosis not present

## 2020-12-13 DIAGNOSIS — M9901 Segmental and somatic dysfunction of cervical region: Secondary | ICD-10-CM | POA: Diagnosis not present

## 2020-12-13 DIAGNOSIS — M9903 Segmental and somatic dysfunction of lumbar region: Secondary | ICD-10-CM | POA: Diagnosis not present

## 2020-12-13 DIAGNOSIS — M9905 Segmental and somatic dysfunction of pelvic region: Secondary | ICD-10-CM | POA: Diagnosis not present

## 2020-12-23 ENCOUNTER — Other Ambulatory Visit: Payer: Self-pay | Admitting: Cardiology

## 2020-12-27 DIAGNOSIS — M9901 Segmental and somatic dysfunction of cervical region: Secondary | ICD-10-CM | POA: Diagnosis not present

## 2020-12-27 DIAGNOSIS — M9905 Segmental and somatic dysfunction of pelvic region: Secondary | ICD-10-CM | POA: Diagnosis not present

## 2020-12-27 DIAGNOSIS — M9904 Segmental and somatic dysfunction of sacral region: Secondary | ICD-10-CM | POA: Diagnosis not present

## 2020-12-27 DIAGNOSIS — M9903 Segmental and somatic dysfunction of lumbar region: Secondary | ICD-10-CM | POA: Diagnosis not present

## 2020-12-31 ENCOUNTER — Other Ambulatory Visit: Payer: Self-pay | Admitting: Cardiology

## 2021-01-12 ENCOUNTER — Ambulatory Visit: Payer: Medicare Other | Attending: Internal Medicine

## 2021-01-12 DIAGNOSIS — Z23 Encounter for immunization: Secondary | ICD-10-CM

## 2021-01-12 NOTE — Progress Notes (Signed)
° °  Covid-19 Vaccination Clinic  Name:  Luke [name]    MRN: [id number] DOB: Dec 29, 1930  01/12/2021  [name] was observed post Covid-19 immunization for 15 minutes without incident. He was provided with Vaccine Information Sheet and instruction to access the V-Safe system.   [name] was instructed to call 911 with any severe reactions post vaccine: Difficulty breathing  Swelling of face and throat  A fast heartbeat  A bad rash all over body  Dizziness and weakness   Immunizations Administered     Name Date Dose VIS Date Route   Pfizer Covid-19 Vaccine Bivalent Booster 01/12/2021  3:03 PM 0.3 mL 09/07/2020 Intramuscular   Manufacturer: [place]   Lot: [id number]   [place]: [phone]

## 2021-01-13 ENCOUNTER — Other Ambulatory Visit (HOSPITAL_BASED_OUTPATIENT_CLINIC_OR_DEPARTMENT_OTHER): Payer: Self-pay

## 2021-01-13 DIAGNOSIS — Z23 Encounter for immunization: Secondary | ICD-10-CM | POA: Diagnosis not present

## 2021-01-13 MED ORDER — PFIZER COVID-19 VAC BIVALENT 30 MCG/0.3ML IM SUSP
INTRAMUSCULAR | 0 refills | Status: DC
  Filled 2021-01-13: qty 0.3, 1d supply, fill #0

## 2021-01-17 DIAGNOSIS — M9904 Segmental and somatic dysfunction of sacral region: Secondary | ICD-10-CM | POA: Diagnosis not present

## 2021-01-17 DIAGNOSIS — M9901 Segmental and somatic dysfunction of cervical region: Secondary | ICD-10-CM | POA: Diagnosis not present

## 2021-01-17 DIAGNOSIS — M542 Cervicalgia: Secondary | ICD-10-CM | POA: Diagnosis not present

## 2021-01-17 DIAGNOSIS — M9903 Segmental and somatic dysfunction of lumbar region: Secondary | ICD-10-CM | POA: Diagnosis not present

## 2021-01-24 DIAGNOSIS — M9904 Segmental and somatic dysfunction of sacral region: Secondary | ICD-10-CM | POA: Diagnosis not present

## 2021-01-24 DIAGNOSIS — M542 Cervicalgia: Secondary | ICD-10-CM | POA: Diagnosis not present

## 2021-01-24 DIAGNOSIS — M9903 Segmental and somatic dysfunction of lumbar region: Secondary | ICD-10-CM | POA: Diagnosis not present

## 2021-01-24 DIAGNOSIS — M9901 Segmental and somatic dysfunction of cervical region: Secondary | ICD-10-CM | POA: Diagnosis not present

## 2021-01-25 DIAGNOSIS — D225 Melanocytic nevi of trunk: Secondary | ICD-10-CM | POA: Insufficient documentation

## 2021-01-25 DIAGNOSIS — L821 Other seborrheic keratosis: Secondary | ICD-10-CM | POA: Insufficient documentation

## 2021-01-27 ENCOUNTER — Encounter: Payer: Self-pay | Admitting: Cardiology

## 2021-01-27 ENCOUNTER — Ambulatory Visit (INDEPENDENT_AMBULATORY_CARE_PROVIDER_SITE_OTHER): Payer: Medicare Other | Admitting: Cardiology

## 2021-01-27 ENCOUNTER — Other Ambulatory Visit: Payer: Self-pay

## 2021-01-27 ENCOUNTER — Other Ambulatory Visit: Payer: Self-pay | Admitting: Cardiology

## 2021-01-27 VITALS — BP 130/68 | HR 54 | Ht 69.0 in | Wt 206.0 lb

## 2021-01-27 DIAGNOSIS — Z951 Presence of aortocoronary bypass graft: Secondary | ICD-10-CM

## 2021-01-27 DIAGNOSIS — E785 Hyperlipidemia, unspecified: Secondary | ICD-10-CM

## 2021-01-27 DIAGNOSIS — I480 Paroxysmal atrial fibrillation: Secondary | ICD-10-CM

## 2021-01-27 DIAGNOSIS — I25110 Atherosclerotic heart disease of native coronary artery with unstable angina pectoris: Secondary | ICD-10-CM

## 2021-01-27 DIAGNOSIS — I82402 Acute embolism and thrombosis of unspecified deep veins of left lower extremity: Secondary | ICD-10-CM | POA: Diagnosis not present

## 2021-01-27 MED ORDER — METOPROLOL TARTRATE 25 MG PO TABS: 25.0000 mg | ORAL_TABLET | Freq: Two times a day (BID) | ORAL | 1 refills | Status: DC

## 2021-01-27 MED ORDER — APIXABAN 2.5 MG PO TABS: 2.5000 mg | ORAL_TABLET | Freq: Two times a day (BID) | ORAL | 1 refills | Status: DC

## 2021-01-27 MED ORDER — AMLODIPINE BESYLATE 10 MG PO TABS: 10.0000 mg | ORAL_TABLET | Freq: Every day | ORAL | 1 refills | Status: DC

## 2021-01-27 MED ORDER — ATORVASTATIN CALCIUM 10 MG PO TABS: ORAL_TABLET | ORAL | 1 refills | Status: DC

## 2021-01-27 NOTE — Patient Instructions (Signed)
Medication Instructions:  Your physician recommends that you continue on your current medications as directed. Please refer to the Current Medication list given to you today.  *If you need a refill on your cardiac medications before your next appointment, please call your pharmacy*   Lab Work: Labs today: Direct LDL, BMP If you have labs (blood work) drawn today and your tests are completely normal, you will receive your results only by: Drain (if you have MyChart) OR A paper copy in the mail If you have any lab test that is abnormal or we need to change your treatment, we will call you to review the results.   Testing/Procedures: Your physician has requested that you have an echocardiogram. Echocardiography is a painless test that uses sound waves to create images of your heart. It provides your doctor with information about the size and shape of your heart and how well your hearts chambers and valves are working. This procedure takes approximately one hour. There are no restrictions for this procedure.    Follow-Up: At [facility], you and your health needs are our priority.  As part of our continuing mission to provide you with exceptional heart care, we have created designated Provider Care Teams.  These Care Teams include your primary Cardiologist (physician) and Advanced Practice Providers (APPs -  Physician Assistants and Nurse Practitioners) who all work together to provide you with the care you need, when you need it.  We recommend signing up for the patient portal called "MyChart".  Sign up information is provided on this After Visit Summary.  MyChart is used to connect with patients for Virtual Visits (Telemedicine).  Patients are able to view lab/test results, encounter notes, upcoming appointments, etc.  Non-urgent messages can be sent to your provider as well.   To learn more about what you can do with MyChart, go to [email or web address].    Your next  appointment:   6 month(s)  The format for your next appointment:   In Person  Provider:   Jenne Campus, MD    Other Instructions None

## 2021-01-27 NOTE — Addendum Note (Signed)
Addended by: Edwyna Shell I on: 01/27/2021 11:01 AM   Modules accepted: Orders

## 2021-01-27 NOTE — Telephone Encounter (Signed)
Prescription refill request for Eliquis received. Indication:Afib Last office visit:3/22 Scr:1.2 Age: 88 Weight:94.3 kg  Prescription refilled

## 2021-01-27 NOTE — Progress Notes (Signed)
Cardiology Office Note:    Date:  01/27/2021   ID:  Luke [name], Luke [name] [birth year], MRN [id number]  PCP:  Shelda Pal, DO  Cardiologist:  Jenne Campus, MD    Referring MD: Shelda Pal*   Chief Complaint  Patient presents with   Medication Management    History of Present Illness:    Luke [name] is a 88 y.o. male with past medical history significant for coronary artery disease status post coronary bypass graft done more than 15 years ago.  Last cardiac catheterization performed in 2019 showed all patent grafts.  Also history of DVT as well as paroxysmal atrial fibrillation on Eliquis, dyslipidemia with intolerance to higher dosages of statin. He comes today to my office for follow-up.  Overall he is doing well.  He described to have arthritic pains in back still try to play golf and he still trying to be active.  Denies have any cardiac complaints there is no palpitation no chest pain tightness squeezing pressure burning chest.  Past Medical History:  Diagnosis Date   Abnormal EKG    Atrial fibrillation ([place])    ATRIAL FIBRILLATION, HX OF 04/16/2008   Qualifier: Diagnosis of  By: Sidney Ace     CAD (coronary artery disease)     5 with the left internal mamary to the left anterior  descending coronary artery.   Chest pain 07/18/2016   Coronary artery disease involving native coronary artery of native heart with unstable angina pectoris ([place])    DEEP VENOUS THROMBOPHLEBITIS, HX OF 04/16/2008   Qualifier: Diagnosis of  By: Sidney Ace     DVT (deep venous thrombosis) ([place]) 02/06/2019   DVT femoral (deep venous thrombosis) with thrombophlebitis ([place])    Dyslipidemia    Dyslipidemia, goal LDL below 70 04/16/2008   Qualifier: Diagnosis of  By: Sidney Ace     HTN (hypertension)    Hx of CABG    Hyperlipidemia 01/30/2012   Hypertension 04/16/2008   Qualifier: Diagnosis of  By: Sidney Ace     Open wound of right hand with tendon involvement  11/18/2016   Osteoarthritis 01/30/2012   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 S/p Phaco/IOL OS with Alcon SN60WF 19.0D, wound zero, on 02/11/2012   Unstable angina [facility])     Past Surgical History:  Procedure Laterality Date   CORONARY ARTERY BYPASS GRAFT  2001   I & D EXTREMITY Right 11/18/2016   Procedure: IRRIGATION AND DEBRIDEMENT RIGHT LONG AND RING FINGER;  Surgeon: Netta Cedars, MD;  Location: [place];  Service: Orthopedics;  Laterality: Right;   LEFT HEART CATH AND CORS/GRAFTS ANGIOGRAPHY N/A 07/19/2016   Procedure: Left Heart Cath and Cors/Grafts Angiography;  Surgeon: Jettie Booze, MD;  Location: [place] [facility];  Service: Cardiovascular;  Laterality: N/A;    Current Medications: Current Meds  Medication Sig   amLODipine (NORVASC) 10 MG tablet TAKE 1 TABLET BY MOUTH EVERY DAY (Patient taking differently: Take 10 mg by mouth daily.)   aspirin 81 MG tablet Take 81 mg by mouth daily.   atorvastatin (LIPITOR) 20 MG tablet TAKE 1 TABLET BY MOUTH 3 TIMES A WEEK. (Patient taking differently: Take 10 mg by mouth daily.)   benazepril (LOTENSIN) 40 MG tablet TAKE 1 TABLET BY MOUTH EVERY DAY (Patient taking differently: Take 40 mg by mouth in the morning.)   Coenzyme Q10 200 MG TABS Take 100 mg by mouth daily.  ELIQUIS 2.5 MG TABS tablet TAKE 1 TABLET BY MOUTH TWICE A DAY (Patient taking differently: Take 5 mg by mouth 2 (two) times daily.)   glucosamine-chondroitin 500-400 MG tablet Take 1 tablet by mouth daily.    Krill Oil 500 MG CAPS Take 500 mg by mouth daily.   metoprolol tartrate (LOPRESSOR) 25 MG tablet Take 1 tablet (25 mg total) by mouth 2 (two) times daily. Patient must schedule annual appointment for future refills   Multiple Vitamins-Minerals (CENTRUM SILVER PO) Take 1 tablet by mouth daily. Unknown strength   OMEGA-3 FATTY ACIDS PO Take 1,200 mg by mouth 2 (two) times daily.     Allergies:   Statins    Social History   Socioeconomic History   Marital status: Widowed    Spouse name: Not on file   Number of children: Not on file   Years of education: Not on file   Highest education level: Not on file  Occupational History   Not on file  Tobacco Use   Smoking status: Former   Smokeless tobacco: Never   Tobacco comments:    stopped in the 1950's  Vaping Use   Vaping Use: Never used  Substance and Sexual Activity   Alcohol use: Yes    Alcohol/week: 0.0 standard drinks    Comment: occ   Drug use: No   Sexual activity: Not on file  Other Topics Concern   Not on file  Social History Narrative   He lives in [place] with his wife.  He does not smoke.He drives about two hours a day which increases his risk for DVT.  He is retired.  He is a Psychologist, occupational at [employer].          Social Determinants of Health   Financial Resource Strain: Low Risk    Difficulty of Paying Living Expenses: Not hard at all  Food Insecurity: No Food Insecurity   Worried About Charity fundraiser in the Last Year: Never true   [place] in the Last Year: Never true  Transportation Needs: No Transportation Needs   Lack of Transportation (Medical): No   Lack of Transportation (Non-Medical): No  Physical Activity: Inactive   Days of Exercise per Week: 0 days   Minutes of Exercise per Session: 0 min  Stress: Stress Concern Present   Feeling of Stress : To some extent  Social Connections: Moderately Isolated   Frequency of Communication with Friends and Family: More than three times a week   Frequency of Social Gatherings with Friends and Family: Once a week   Attends Religious Services: Never   Marine scientist or Organizations: Yes   Attends Archivist Meetings: 1 to 4 times per year   Marital Status: Widowed     Family History: The patient's family history includes Cancer in his mother; Coronary artery disease in an other family member; Heart attack in  his father; Heart disease in his father and mother. ROS:   Please see the history of present illness.    All 14 point review of systems negative except as described per history of present illness  EKGs/Labs/Other Studies Reviewed:      Recent Labs: 11/04/2020: ALT 18; BUN 27; Creatinine 1.27; Hemoglobin 13.4; Platelet Count 166; Potassium 4.7; Sodium 143  Recent Lipid Panel    Component Value Date/Time   CHOL 161 09/23/2019 0841   TRIG 178 (H) 09/23/2019 0841   TRIG 89 11/15/2005 0820   HDL  34 (L) 09/23/2019 0841   CHOLHDL 4.7 09/23/2019 0841   CHOLHDL 4.2 02/28/2015 1541   VLDL 23 02/28/2015 1541   LDLCALC 96 09/23/2019 0841   LDLDIRECT 139.9 06/18/2006 1008    Physical Exam:    VS:  BP 130/68 (BP Location: Right Arm, Patient Position: Sitting)    Pulse (!) 54    Ht 5\' 9"  (1.753 m)    Wt 206 lb (93.4 kg)    SpO2 94%    BMI 30.42 kg/m     Wt Readings from Last 3 Encounters:  01/27/21 206 lb (93.4 kg)  11/04/20 208 lb (94.3 kg)  05/06/20 210 lb (95.3 kg)     GEN:  Well nourished, well developed in no acute distress HEENT: Normal NECK: No JVD; No carotid bruits LYMPHATICS: No lymphadenopathy CARDIAC: RRR, no murmurs, no rubs, no gallops RESPIRATORY:  Clear to auscultation without rales, wheezing or rhonchi  ABDOMEN: Soft, non-tender, non-distended MUSCULOSKELETAL:  No edema; No deformity  SKIN: Warm and dry LOWER EXTREMITIES: no swelling NEUROLOGIC:  Alert and oriented x 3 PSYCHIATRIC:  Normal affect   ASSESSMENT:    1. Coronary artery disease involving native coronary artery of native heart with unstable angina pectoris ([place])   2. Hx of CABG   3. Paroxysmal atrial fibrillation ([facility])   4. Acute deep vein thrombosis (DVT) of left lower extremity, unspecified vein ([facility])   5. Dyslipidemia, goal LDL below 70    PLAN:    In order of problems listed above:  Coronary disease stable from that point review denies have any symptomatology.  On appropriate medications  which I will continue. Paroxysmal atrial fibrillation denies have any palpitation he is anticoagulated with Eliquis which I will continue. History of DVT.  Stable with no new issues.  I am questioning the dose of his Eliquis he is taking 2.5 twice daily from atrial fibrillation point review it should be 5 mg twice daily unless somebody is more than 88 which he is weight is less than 60 kg which he does not have, I will recheck his creatinine if creatinine is more than 1.5 then the dose will be maintained if not we will increase to 5 mg twice daily Dyslipidemia check his fasting lipid profile last the results I have is from 2021 which showing LDL of 96.  He takes Lipitor 20 mg 3 times a week and he cannot tolerate higher dosages in the future if LDL is still above 70 we may be forced to add Zetia. I will schedule him also to have an echocardiogram to assess left ventricle ejection fraction.   Medication Adjustments/Labs and Tests Ordered: Current medicines are reviewed at length with the patient today.  Concerns regarding medicines are outlined above.  No orders of the defined types were placed in this encounter.  Medication changes: No orders of the defined types were placed in this encounter.   Signed, Park Liter, MD, [facility] 01/27/2021 10:52 AM    [place]

## 2021-01-27 NOTE — Addendum Note (Signed)
Addended by: Edwyna Shell I on: 01/27/2021 11:16 AM   Modules accepted: Orders

## 2021-01-28 LAB — BASIC METABOLIC PANEL
BUN/Creatinine Ratio: 20 (ref 10–24)
BUN: 24 mg/dL (ref 8–27)
CO2: 23 mmol/L (ref 20–29)
Calcium: 9.5 mg/dL (ref 8.6–10.2)
Chloride: 106 mmol/L (ref 96–106)
Creatinine, Ser: 1.2 mg/dL (ref 0.76–1.27)
Glucose: 89 mg/dL (ref 70–99)
Potassium: 5 mmol/L (ref 3.5–5.2)
Sodium: 144 mmol/L (ref 134–144)
eGFR: 58 mL/min/{1.73_m2} — ABNORMAL LOW (ref >59)

## 2021-01-28 LAB — LDL CHOLESTEROL, DIRECT: LDL Direct: 81 mg/dL (ref 0–99)

## 2021-01-31 ENCOUNTER — Other Ambulatory Visit: Payer: Self-pay

## 2021-01-31 DIAGNOSIS — M9901 Segmental and somatic dysfunction of cervical region: Secondary | ICD-10-CM | POA: Diagnosis not present

## 2021-01-31 DIAGNOSIS — I82402 Acute embolism and thrombosis of unspecified deep veins of left lower extremity: Secondary | ICD-10-CM

## 2021-01-31 DIAGNOSIS — M9904 Segmental and somatic dysfunction of sacral region: Secondary | ICD-10-CM | POA: Diagnosis not present

## 2021-01-31 DIAGNOSIS — M9903 Segmental and somatic dysfunction of lumbar region: Secondary | ICD-10-CM | POA: Diagnosis not present

## 2021-01-31 DIAGNOSIS — M542 Cervicalgia: Secondary | ICD-10-CM | POA: Diagnosis not present

## 2021-01-31 MED ORDER — APIXABAN 5 MG PO TABS: 5.0000 mg | ORAL_TABLET | Freq: Two times a day (BID) | ORAL | 5 refills | Status: DC

## 2021-01-31 NOTE — Telephone Encounter (Signed)
Prescription refill request for Eliquis received.  Indication:dvt, afib  Last office visit: 01/27/2021, Agustin Cree Scr: 1.20, 01/27/2021 Age: 88 yo  Weight: 93.4 kg   Refill sent.

## 2021-02-06 ENCOUNTER — Other Ambulatory Visit: Payer: Self-pay

## 2021-02-06 ENCOUNTER — Telehealth: Payer: Self-pay | Admitting: Cardiology

## 2021-02-06 NOTE — Telephone Encounter (Signed)
Called patient to get more information. Patient is concerned about the high cost of the Eliquis medication and being able to afford it. He has two questions:  Why was his Eliquis dosage increased and is there a cheaper alternative that he can take instead of Eliquis?

## 2021-02-06 NOTE — Telephone Encounter (Signed)
Pt c/o medication issue:  1. Name of Medication: apixaban (ELIQUIS) 5 MG TABS tablet  2. How are you currently taking this medication (dosage and times per day)? Take 1 tablet (5 mg total) by mouth 2 (two) times daily.  3. Are you having a reaction (difficulty breathing--STAT)? no  4. What is your medication issue?  Patient calling in bout the dosage being increase and the cost of it.

## 2021-02-06 NOTE — Telephone Encounter (Signed)
Called patient and informed him of Dr. Wendy Poet reasoning for increasing his Eliquis and the alternative medication called Coumadin. Patient did not want to go on coumadin. I also informed him that there was the opportunity of patient assistance to help cover the cost of Eliquis. I informed him that I would leave the patient assistance paperwork at the front desk for him to pick up. Patient was agreeable with this plan and had no further questions at this time.

## 2021-02-06 NOTE — Telephone Encounter (Signed)
I will forward these questions to Dr. Agustin Cree.

## 2021-02-07 ENCOUNTER — Other Ambulatory Visit: Payer: Self-pay | Admitting: Family

## 2021-02-07 DIAGNOSIS — M9901 Segmental and somatic dysfunction of cervical region: Secondary | ICD-10-CM | POA: Diagnosis not present

## 2021-02-07 DIAGNOSIS — M9904 Segmental and somatic dysfunction of sacral region: Secondary | ICD-10-CM | POA: Diagnosis not present

## 2021-02-07 DIAGNOSIS — M9903 Segmental and somatic dysfunction of lumbar region: Secondary | ICD-10-CM | POA: Diagnosis not present

## 2021-02-07 DIAGNOSIS — M542 Cervicalgia: Secondary | ICD-10-CM | POA: Diagnosis not present

## 2021-02-14 DIAGNOSIS — M9904 Segmental and somatic dysfunction of sacral region: Secondary | ICD-10-CM | POA: Diagnosis not present

## 2021-02-14 DIAGNOSIS — M9903 Segmental and somatic dysfunction of lumbar region: Secondary | ICD-10-CM | POA: Diagnosis not present

## 2021-02-14 DIAGNOSIS — M542 Cervicalgia: Secondary | ICD-10-CM | POA: Diagnosis not present

## 2021-02-14 DIAGNOSIS — M9901 Segmental and somatic dysfunction of cervical region: Secondary | ICD-10-CM | POA: Diagnosis not present

## 2021-02-17 ENCOUNTER — Other Ambulatory Visit (HOSPITAL_BASED_OUTPATIENT_CLINIC_OR_DEPARTMENT_OTHER): Payer: Self-pay

## 2021-02-17 ENCOUNTER — Other Ambulatory Visit: Payer: Self-pay

## 2021-02-17 ENCOUNTER — Ambulatory Visit (HOSPITAL_BASED_OUTPATIENT_CLINIC_OR_DEPARTMENT_OTHER)
Admission: RE | Admit: 2021-02-17 | Discharge: 2021-02-17 | Disposition: A | Discharge status: Home / Self Care | Payer: Medicare Other | Source: Ambulatory Visit | Attending: Cardiology | Admitting: Cardiology

## 2021-02-17 DIAGNOSIS — I480 Paroxysmal atrial fibrillation: Secondary | ICD-10-CM | POA: Insufficient documentation

## 2021-02-17 DIAGNOSIS — E785 Hyperlipidemia, unspecified: Secondary | ICD-10-CM | POA: Diagnosis not present

## 2021-02-17 DIAGNOSIS — Z951 Presence of aortocoronary bypass graft: Secondary | ICD-10-CM | POA: Insufficient documentation

## 2021-02-17 DIAGNOSIS — I25110 Atherosclerotic heart disease of native coronary artery with unstable angina pectoris: Secondary | ICD-10-CM | POA: Diagnosis not present

## 2021-02-17 DIAGNOSIS — I82402 Acute embolism and thrombosis of unspecified deep veins of left lower extremity: Secondary | ICD-10-CM | POA: Insufficient documentation

## 2021-02-17 LAB — ECHOCARDIOGRAM COMPLETE
Area-P 1/2: 4.96 cm2
P 1/2 time: 428 msec
S' Lateral: 3.5 cm

## 2021-02-21 ENCOUNTER — Telehealth: Payer: Self-pay | Admitting: Cardiology

## 2021-02-21 NOTE — Telephone Encounter (Signed)
° °  Pt is returning call to get echo result °

## 2021-02-21 NOTE — Telephone Encounter (Signed)
Patient informed of the result

## 2021-03-07 NOTE — Progress Notes (Signed)
Subjective:   Luke [name] is a 88 y.o. male who presents for Medicare Annual/Subsequent preventive examination.    Review of Systems     Cardiac Risk Factors include: advanced age (>20men, >90 women);hypertension;dyslipidemia;obesity (BMI >30kg/m2)     Objective:    Today's Vitals   03/09/21 1334  BP: 110/70  Pulse: 75  Resp: 16  Temp: 98.6 F (37 C)  TempSrc: Oral  SpO2: 97%  Weight: 209 lb 12.8 oz (95.2 kg)  Height: 5\' 9"  (1.753 m)   Body mass index is 30.98 kg/m.  Advanced Directives 03/09/2021 11/04/2020 03/03/2020 11/06/2019 08/06/2019 05/07/2019 02/06/2019  Does Patient Have a Medical Advance Directive? Yes Yes Yes Yes Yes Yes No  Type of Paramedic of [place];Living will Living will;Healthcare Power of [place];Living will Living will;Healthcare Power of [place];Living will [place];Living will Living will;Healthcare Power of Attorney  Does patient want to make changes to medical advance directive? - No - Patient declined - No - Patient declined No - Patient declined No - Patient declined -  Copy of [place] in Chart? No - copy requested - No - copy requested - No - copy requested No - copy requested No - copy requested  Would patient like information on creating a medical advance directive? - - - - No - Patient declined No - Patient declined No - Patient declined    Current Medications (verified) Outpatient Encounter Medications as of 03/09/2021  Medication Sig   amLODipine (NORVASC) 10 MG tablet Take 1 tablet (10 mg total) by mouth daily.   apixaban (ELIQUIS) 5 MG TABS tablet Take 1 tablet (5 mg total) by mouth 2 (two) times daily.   aspirin 81 MG tablet Take 81 mg by mouth daily.   atorvastatin (LIPITOR) 10 MG tablet One tablet three times a week   benazepril (LOTENSIN) 40 MG tablet TAKE 1 TABLET BY MOUTH EVERY DAY (Patient taking differently:  Take 40 mg by mouth in the morning.)   Coenzyme Q10 200 MG TABS Take 100 mg by mouth daily.   glucosamine-chondroitin 500-400 MG tablet Take 1 tablet by mouth daily.    Krill Oil 500 MG CAPS Take 500 mg by mouth daily.   metoprolol tartrate (LOPRESSOR) 25 MG tablet Take 1 tablet (25 mg total) by mouth 2 (two) times daily. Patient must schedule annual appointment for future refills   Multiple Vitamins-Minerals (CENTRUM SILVER PO) Take 1 tablet by mouth daily. Unknown strength   OMEGA-3 FATTY ACIDS PO Take 1,200 mg by mouth 2 (two) times daily.   ezetimibe (ZETIA) 10 MG tablet Take 10 mg by mouth daily.   No facility-administered encounter medications on file as of 03/09/2021.    Allergies (verified) Statins   History: Past Medical History:  Diagnosis Date   Abnormal EKG    Atrial fibrillation ([place])    ATRIAL FIBRILLATION, HX OF 04/16/2008   Qualifier: Diagnosis of  By: Sidney Ace     CAD (coronary artery disease)     5 with the left internal mamary to the left anterior  descending coronary artery.   Chest pain 07/18/2016   Coronary artery disease involving native coronary artery of native heart with unstable angina pectoris (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 S/p Phaco/IOL OS with Alcon SN60WF 19.0D, wound zero, on 02/11/2012   Unstable angina [facility])    Past Surgical  History:  Procedure Laterality Date   CORONARY ARTERY BYPASS GRAFT  2001   I & D EXTREMITY Right 11/18/2016   Procedure: IRRIGATION AND DEBRIDEMENT RIGHT LONG AND RING FINGER;  Surgeon: Netta Cedars, MD;  Location: [facility];  Service: Orthopedics;  Laterality: Right;   LEFT HEART CATH AND CORS/GRAFTS ANGIOGRAPHY N/A 07/19/2016   Procedure: Left Heart Cath and Cors/Grafts Angiography;  Surgeon: Jettie Booze, MD;  Location: [facility];  Service: Cardiovascular;  Laterality: N/A;   Family History  Problem Relation Age of Onset   Heart disease Mother    Cancer Mother    Heart disease Father    Heart attack Father    Coronary artery disease Other    Social History   Socioeconomic History   Marital status: Widowed    Spouse name: Not on file   Number of children: Not on file   Years of education: Not on file   Highest education level: Not on file  Occupational History   Not on file  Tobacco Use   Smoking status: Former   Smokeless tobacco: Never   Tobacco comments:    stopped in the 1950's  Vaping Use   Vaping Use: Never used  Substance and Sexual Activity   Alcohol use: Yes    Alcohol/week: 0.0 standard drinks    Comment: occ   Drug use: No   Sexual activity: Not on file  Other Topics Concern   Not on file  Social History Narrative   He lives in [place] with his wife.  He does not smoke.He drives about two hours a day which increases his risk for DVT.  He is retired.  He is a Psychologist, occupational at [employer].          Social Determinants of Health   Financial Resource Strain: Medium Risk   Difficulty of Paying Living Expenses: Somewhat hard  Food Insecurity: No Food Insecurity   Worried About Charity fundraiser in the Last Year: Never true   Ran Out of Food in the Last Year: Never true  Transportation Needs: No Transportation Needs   Lack of Transportation (Medical): No   Lack of Transportation (Non-Medical): No  Physical Activity:  Insufficiently Active   Days of Exercise per Week: 3 days   Minutes of Exercise per Session: 30 min  Stress: No Stress Concern Present   Feeling of Stress : Not at all  Social Connections: Socially Isolated   Frequency of Communication with Friends and Family: Three times a week   Frequency of Social Gatherings with Friends and Family: Three times a week   Attends Religious Services: Never   Active Member of Clubs or Organizations: No   Attends Archivist Meetings: Never   Marital Status: Widowed    Tobacco Counseling Counseling given: Not Answered Tobacco comments: stopped  in the 1950's   Clinical Intake:  Pre-visit preparation completed: Yes  Pain : No/denies pain     BMI - recorded: 30.98 Nutritional Status: BMI > 30  Obese Nutritional Risks: None Diabetes: No  How often do you need to have someone help you when you read instructions, pamphlets, or other written materials from your doctor or pharmacy?: 1 - Never  Diabetic?No  Interpreter Needed?: No  Information entered by :: Caroleen Hamman LPN   Activities of Daily Living In your present state of health, do you have any difficulty performing the following activities: 03/09/2021  Hearing? N  Vision? N  Difficulty concentrating or making decisions? N  Walking or climbing stairs? N  Dressing or bathing? N  Doing errands, shopping? N  Preparing Food and eating ? N  Using the Toilet? N  In the past six months, have you accidently leaked urine? N  Do you have problems with loss of bowel control? N  Managing your Medications? N  Managing your Finances? N  Housekeeping or managing your Housekeeping? N  Some recent data might be hidden    Patient Care Team: Shelda Pal, DO as PCP - General (Family Medicine) Berniece Salines, DO as PCP - Cardiology (Cardiology)  Indicate any recent Medical Services you may have received from other than [facility] providers in the past year (date may be  approximate).     Assessment:   This is a routine wellness examination for [facility].  Hearing/Vision screen Hearing Screening - Comments:: C/o mild hearing loss Vision Screening - Comments:: Last eye exam-within the last year-  Dietary issues and exercise activities discussed: Current Exercise Habits: Home exercise routine, Type of exercise: Other - see comments (gym), Time (Minutes): 30, Frequency (Times/Week): 3, Weekly Exercise (Minutes/Week): 90, Intensity: Mild, Exercise limited by: None identified   Goals Addressed             This Visit's Progress    Patient Stated   Not on track    Would like to lose more weight & continue playing golf       Depression Screen PHQ 2/9 Scores 03/09/2021 03/03/2020  PHQ - 2 Score 1 1    Fall Risk Fall Risk  03/09/2021 03/03/2020 02/02/2019 09/15/2015  Falls in the past year? - 1 0 No  Comment - - - Emmi Telephone Survey: data to providers prior to load  Number falls in past yr: 0 0 0 -  Injury with Fall? 0 1 0 -  Risk for fall due to : - History of fall(s) - -  Follow up Falls prevention discussed Falls prevention discussed - -    FALL RISK PREVENTION PERTAINING TO THE HOME:  Any stairs in or around the home? Yes  If so, are there any without handrails? No  Home free of loose throw rugs in walkways, pet beds, electrical cords, etc? Yes  Adequate lighting in your home to reduce risk of falls? Yes   ASSISTIVE DEVICES UTILIZED TO PREVENT FALLS:  Life alert? No  Use of a cane, walker or w/c? No  Grab bars in the bathroom? Yes  Shower chair or bench in shower? Yes  Elevated toilet seat or a handicapped toilet? No   TIMED UP AND GO:  Was the test performed? Yes .  Length of time to ambulate 10 feet: 11 sec.   Gait steady and fast without use of assistive device  Cognitive Function:Normal cognitive status assessed by direct observation by this Nurse Health Advisor. No  abnormalities found.          Immunizations Immunization  History  Administered Date(s) Administered   Influenza, High Dose Seasonal PF 10/31/2016, 09/27/2017, 09/28/2017, 10/04/2018   Influenza-Unspecified 11/07/2019, 11/27/2020   PFIZER(Purple Top)SARS-COV-2 Vaccination 02/13/2019, 03/10/2019, 10/06/2019   Pfizer Covid-19 Vaccine Bivalent Booster 35yrs & up 01/12/2021   Pneumococcal Conjugate-13 12/07/2015   Pneumococcal Polysaccharide-23 10/31/2016   Td 01/09/2004   Tdap 11/16/2016    TDAP status: Up to date  Flu Vaccine status: Up to date  Pneumococcal vaccine status: Up to date  Covid-19 vaccine status: Completed vaccines  Qualifies for Shingles Vaccine? Yes   Zostavax completed No   Shingrix Completed?: No.    Education has been provided regarding the importance of this vaccine. Patient has been advised to call insurance company to determine out of pocket expense if they have not yet received this vaccine. Advised may also receive vaccine at local pharmacy or Health Dept. Verbalized acceptance and understanding.  Screening Tests Health Maintenance  Topic Date Due   Zoster Vaccines- Shingrix (1 of 2) Never done   TETANUS/TDAP  11/17/2026   Pneumonia Vaccine 61+ Years old  Completed   INFLUENZA VACCINE  Completed   COVID-19 Vaccine  Completed   HPV VACCINES  Aged Out    Health Maintenance  Health Maintenance Due  Topic Date Due   Zoster Vaccines- Shingrix (1 of 2) Never done    Colorectal cancer screening: No longer required.   Lung Cancer Screening: (Low Dose CT Chest recommended if Age 61-80 years, 30 pack-year currently smoking OR have quit w/in 15years.) does not qualify.     Additional Screening:  Hepatitis C Screening: does not qualify  Vision Screening: Recommended annual ophthalmology exams for early detection of glaucoma and other disorders of the eye. Is the patient up to date with their annual eye exam?  Yes  Who is the provider or what is the name of the office in which the patient attends annual eye  exams? Pt unsure of name   Dental Screening: Recommended annual dental exams for proper oral hygiene  Community Resource Referral / Chronic Care Management: CRR required this visit?  No   CCM required this visit?  No      Plan:     I have personally reviewed and noted the following in the patients chart:   Medical and social history Use of alcohol, tobacco or illicit drugs  Current medications and supplements including opioid prescriptions. Patient is not currently taking opioid prescriptions. Functional ability and status Nutritional status Physical activity Advanced directives List of other physicians Hospitalizations, surgeries, and ER visits in previous 12 months Vitals Screenings to include cognitive, depression, and falls Referrals and appointments  In addition, I have reviewed and discussed with patient certain preventive protocols, quality metrics, and best practice recommendations. A written personalized care plan for preventive services as well as general preventive health recommendations were provided to patient.     Marta Antu, LPN   03/14/6281  Nurse Health Advisor  Nurse Notes: None

## 2021-03-09 ENCOUNTER — Ambulatory Visit (INDEPENDENT_AMBULATORY_CARE_PROVIDER_SITE_OTHER): Payer: Medicare Other

## 2021-03-09 VITALS — BP 110/70 | HR 75 | Temp 98.6°F | Resp 16 | Ht 69.0 in | Wt 209.8 lb

## 2021-03-09 DIAGNOSIS — I82419 Acute embolism and thrombosis of unspecified femoral vein: Secondary | ICD-10-CM

## 2021-03-09 DIAGNOSIS — I25110 Atherosclerotic heart disease of native coronary artery with unstable angina pectoris: Secondary | ICD-10-CM | POA: Diagnosis not present

## 2021-03-09 DIAGNOSIS — I10 Essential (primary) hypertension: Secondary | ICD-10-CM

## 2021-03-09 DIAGNOSIS — Z0000 Encounter for general adult medical examination without abnormal findings: Secondary | ICD-10-CM | POA: Diagnosis not present

## 2021-03-09 NOTE — Patient Instructions (Signed)
Luke [name] , Thank you for taking time to come for your Medicare Wellness Visit. I appreciate your ongoing commitment to your health goals. Please review the following plan we discussed and let me know if I can assist you in the future.   Screening recommendations/referrals: Colonoscopy: No longer required Recommended yearly ophthalmology/optometry visit for glaucoma screening and checkup Recommended yearly dental visit for hygiene and checkup  Vaccinations: Influenza vaccine: Up to date Pneumococcal vaccine: Up to date Tdap vaccine: Up to date Shingles vaccine:-May obtain vaccine at your local pharmacy. Covid-19: Up to date  Advanced directives: Please bring a copy of Living Will and/or Healthcare Power of Attorney for your chart.   Conditions/risks identified: See problem list  Next appointment: Follow up in one year for your annual wellness visit.   Preventive Care 21 Years and Older, Male Preventive care refers to lifestyle choices and visits with your health care provider that can promote health and wellness. What does preventive care include? A yearly physical exam. This is also called an annual well check. Dental exams once or twice a year. Routine eye exams. Ask your health care provider how often you should have your eyes checked. Personal lifestyle choices, including: Daily care of your teeth and gums. Regular physical activity. Eating a healthy diet. Avoiding tobacco and drug use. Limiting alcohol use. Practicing safe sex. Taking low doses of aspirin every day. Taking vitamin and mineral supplements as recommended by your health care provider. What happens during an annual well check? The services and screenings done by your health care provider during your annual well check will depend on your age, overall health, lifestyle risk factors, and family history of disease. Counseling  Your health care provider may ask you questions about your: Alcohol use. Tobacco  use. Drug use. Emotional well-being. Home and relationship well-being. Sexual activity. Eating habits. History of falls. Memory and ability to understand (cognition). Work and work Statistician. Screening  You may have the following tests or measurements: Height, weight, and BMI. Blood pressure. Lipid and cholesterol levels. These may be checked every 5 years, or more frequently if you are over 70 years old. Skin check. Lung cancer screening. You may have this screening every year starting at age 78 if you have a 30-pack-year history of smoking and currently smoke or have quit within the past 15 years. Fecal occult blood test (FOBT) of the stool. You may have this test every year starting at age 63. Flexible sigmoidoscopy or colonoscopy. You may have a sigmoidoscopy every 5 years or a colonoscopy every 10 years starting at age 109. Prostate cancer screening. Recommendations will vary depending on your family history and other risks. Hepatitis C blood test. Hepatitis B blood test. Sexually transmitted disease (STD) testing. Diabetes screening. This is done by checking your blood sugar (glucose) after you have not eaten for a while (fasting). You may have this done every 1-3 years. Abdominal aortic aneurysm (AAA) screening. You may need this if you are a current or former smoker. Osteoporosis. You may be screened starting at age 58 if you are at high risk. Talk with your health care provider about your test results, treatment options, and if necessary, the need for more tests. Vaccines  Your health care provider may recommend certain vaccines, such as: Influenza vaccine. This is recommended every year. Tetanus, diphtheria, and acellular pertussis (Tdap, Td) vaccine. You may need a Td booster every 10 years. Zoster vaccine. You may need this after age 63. Pneumococcal 13-valent conjugate (PCV13) vaccine.  One dose is recommended after age 60. Pneumococcal polysaccharide (PPSV23) vaccine.  One dose is recommended after age 57. Talk to your health care provider about which screenings and vaccines you need and how often you need them. This information is not intended to replace advice given to you by your health care provider. Make sure you discuss any questions you have with your health care provider. Document Released: 01/21/2015 Document Revised: 09/14/2015 Document Reviewed: 10/26/2014 Elsevier Interactive Patient Education  2017 [place] Prevention in the Home Falls can cause injuries. They can happen to people of all ages. There are many things you can do to make your home safe and to help prevent falls. What can I do on the outside of my home? Regularly fix the edges of walkways and driveways and fix any cracks. Remove anything that might make you trip as you walk through a door, such as a raised step or threshold. Trim any bushes or trees on the path to your home. Use bright outdoor lighting. Clear any walking paths of anything that might make someone trip, such as rocks or tools. Regularly check to see if handrails are loose or broken. Make sure that both sides of any steps have handrails. Any raised decks and porches should have guardrails on the edges. Have any leaves, snow, or ice cleared regularly. Use sand or salt on walking paths during winter. Clean up any spills in your garage right away. This includes oil or grease spills. What can I do in the bathroom? Use night lights. Install grab bars by the toilet and in the tub and shower. Do not use towel bars as grab bars. Use non-skid mats or decals in the tub or shower. If you need to sit down in the shower, use a plastic, non-slip stool. Keep the floor dry. Clean up any water that spills on the floor as soon as it happens. Remove soap buildup in the tub or shower regularly. Attach bath mats securely with double-sided non-slip rug tape. Do not have throw rugs and other things on the floor that can make  you trip. What can I do in the bedroom? Use night lights. Make sure that you have a light by your bed that is easy to reach. Do not use any sheets or blankets that are too big for your bed. They should not hang down onto the floor. Have a firm chair that has side arms. You can use this for support while you get dressed. Do not have throw rugs and other things on the floor that can make you trip. What can I do in the kitchen? Clean up any spills right away. Avoid walking on wet floors. Keep items that you use a lot in easy-to-reach places. If you need to reach something above you, use a strong step stool that has a grab bar. Keep electrical cords out of the way. Do not use floor polish or wax that makes floors slippery. If you must use wax, use non-skid floor wax. Do not have throw rugs and other things on the floor that can make you trip. What can I do with my stairs? Do not leave any items on the stairs. Make sure that there are handrails on both sides of the stairs and use them. Fix handrails that are broken or loose. Make sure that handrails are as long as the stairways. Check any carpeting to make sure that it is firmly attached to the stairs. Fix any carpet that is loose or  worn. Avoid having throw rugs at the top or bottom of the stairs. If you do have throw rugs, attach them to the floor with carpet tape. Make sure that you have a light switch at the top of the stairs and the bottom of the stairs. If you do not have them, ask someone to add them for you. What else can I do to help prevent falls? Wear shoes that: Do not have high heels. Have rubber bottoms. Are comfortable and fit you well. Are closed at the toe. Do not wear sandals. If you use a stepladder: Make sure that it is fully opened. Do not climb a closed stepladder. Make sure that both sides of the stepladder are locked into place. Ask someone to hold it for you, if possible. Clearly mark and make sure that you can  see: Any grab bars or handrails. First and last steps. Where the edge of each step is. Use tools that help you move around (mobility aids) if they are needed. These include: Canes. Walkers. Scooters. Crutches. Turn on the lights when you go into a dark area. Replace any light bulbs as soon as they burn out. Set up your furniture so you have a clear path. Avoid moving your furniture around. If any of your floors are uneven, fix them. If there are any pets around you, be aware of where they are. Review your medicines with your doctor. Some medicines can make you feel dizzy. This can increase your chance of falling. Ask your doctor what other things that you can do to help prevent falls. This information is not intended to replace advice given to you by your health care provider. Make sure you discuss any questions you have with your health care provider. Document Released: 10/21/2008 Document Revised: 06/02/2015 Document Reviewed: 01/29/2014 Elsevier Interactive Patient Education  2017 [employer].

## 2021-03-10 ENCOUNTER — Telehealth: Payer: Self-pay | Admitting: *Deleted

## 2021-03-10 NOTE — Chronic Care Management (AMB) (Signed)
Patient referred to practice contact to schedule 12 month PCP visit; referral deferred x 14 days; CG will review status of scheduling of PCP visit on day 14; if visit scheduled, CG will defer again until 1 business day after scheduled PCP visit and will outreach to patient to schedule CCM visit thereafter; if no PCP visit scheduled by day 14, will notify appropriate practice scheduling personnel or place request in practice appropriate clinical pool ?

## 2021-04-18 ENCOUNTER — Other Ambulatory Visit: Payer: Self-pay | Admitting: Cardiology

## 2021-04-18 DIAGNOSIS — M9901 Segmental and somatic dysfunction of cervical region: Secondary | ICD-10-CM | POA: Diagnosis not present

## 2021-04-18 DIAGNOSIS — M9904 Segmental and somatic dysfunction of sacral region: Secondary | ICD-10-CM | POA: Diagnosis not present

## 2021-04-18 DIAGNOSIS — M9903 Segmental and somatic dysfunction of lumbar region: Secondary | ICD-10-CM | POA: Diagnosis not present

## 2021-04-18 DIAGNOSIS — M542 Cervicalgia: Secondary | ICD-10-CM | POA: Diagnosis not present

## 2021-04-24 ENCOUNTER — Ambulatory Visit (INDEPENDENT_AMBULATORY_CARE_PROVIDER_SITE_OTHER): Payer: Medicare Other | Admitting: Family Medicine

## 2021-04-24 ENCOUNTER — Encounter: Payer: Self-pay | Admitting: Family Medicine

## 2021-04-24 VITALS — BP 130/69 | HR 56 | Temp 97.7°F | Ht 69.0 in | Wt 207.2 lb

## 2021-04-24 DIAGNOSIS — I480 Paroxysmal atrial fibrillation: Secondary | ICD-10-CM | POA: Diagnosis not present

## 2021-04-24 NOTE — Progress Notes (Signed)
Chief Complaint  ?Patient presents with  ? Follow-up  ?  medications  ? ? ?Subjective: ?Patient is a 88 y.o. male here for med review. ? ?Patient has a history of nonvalvular atrial fibrillation for which he takes metoprolol 25 mg twice daily and Eliquis 5 mg twice daily for.  He is given 2.5 mg twice daily and is questioning whether he should go back on this.  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  He is wondering if he needs to continue seeing the hematology team for this. ? ?Past Medical History:  ?Diagnosis Date  ? Abnormal EKG   ? Atrial fibrillation ([place])   ? ATRIAL FIBRILLATION, HX OF 04/16/2008  ? Qualifier: Diagnosis of  By: Sidney Ace    ? CAD (coronary artery disease)   ?  5 with the left internal mamary to the left anterior  descending coronary artery.  ? Chest pain 07/18/2016  ? Coronary artery disease involving native coronary artery of native heart with unstable angina pectoris ([place] [address])   ? DEEP VENOUS THROMBOPHLEBITIS, HX OF 04/16/2008  ? Qualifier: Diagnosis of  By: Sidney Ace    ? DVT (deep venous thrombosis) ([place]) 02/06/2019  ? DVT femoral (deep venous thrombosis) with thrombophlebitis ([place])   ? Dyslipidemia   ? Dyslipidemia, goal LDL below 70 04/16/2008  ? Qualifier: Diagnosis of  By: Sidney Ace    ? HTN (hypertension)   ? Hx of CABG   ? Hyperlipidemia 01/30/2012  ? Hypertension 04/16/2008  ? Qualifier: Diagnosis of  By: Sidney Ace    ? Open wound of right hand with tendon involvement 11/18/2016  ? Osteoarthritis 01/30/2012  ? 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 Formatting of this note might be different from the original. S/p Phaco/IOL OS with Alcon SN60WF 19.0D, wound zero, on 02/11/2012  ? Unstable angina (HCC)   ? ? ?Objective: ?BP 130/69   Pulse (!) 56   Temp 97.7 ?F (36.5 ?C) (Oral)   Ht '5\' 9"'$  (1.753 m)   Wt 207 lb 4 oz (94 kg)   SpO2 99%   BMI 30.61 kg/m?  ?General: Awake, appears stated age ?Lungs: No accessory muscle use ?Psych: Age appropriate  judgment and insight, normal affect and mood ? ?Assessment and Plan: ?Paroxysmal atrial fibrillation ([place]) ? ?I am not convinced he needs to see the hematology team any further fall he is doing is receiving anticoagulation.  Dr. Agustin Cree of the cardiology team is doing that.  Given that he is over 13, I will verify with his cardiology team that he needs to be on 5 mg twice daily.  Encouraged him to reach out as well.  I do not see any medication that would be appropriate for me to take away.  They are all prescribed by his cardiology team and seem appropriate given his comorbidities and current health status.  He appears to be more functional and thinking clearly compared with most 88 year old folks. ?The patient voiced understanding and agreement to the plan. ? ?I spent 35 minutes discussing the above plan with the patient in addition to reaching out to specialists and reviewing his chart on the same day of the visit. ? ?Shelda Pal, DO ?04/24/21  ?4:56 PM ? ? ? ? ?

## 2021-04-24 NOTE — Patient Instructions (Addendum)
Double check with Dr. Agustin Cree regarding the dosage of your Eliquis. I will check with him as well by sending him a message. ? ?I think it is reasonable to stop seeing the hematology team as long as you are on Eliquis.  ? ?Let us know if you need anything. ?

## 2021-04-25 DIAGNOSIS — M9903 Segmental and somatic dysfunction of lumbar region: Secondary | ICD-10-CM | POA: Diagnosis not present

## 2021-04-25 DIAGNOSIS — M9904 Segmental and somatic dysfunction of sacral region: Secondary | ICD-10-CM | POA: Diagnosis not present

## 2021-04-25 DIAGNOSIS — M9901 Segmental and somatic dysfunction of cervical region: Secondary | ICD-10-CM | POA: Diagnosis not present

## 2021-04-25 DIAGNOSIS — M542 Cervicalgia: Secondary | ICD-10-CM | POA: Diagnosis not present

## 2021-05-01 ENCOUNTER — Telehealth: Payer: Self-pay

## 2021-05-01 NOTE — Telephone Encounter (Signed)
Returned pt's call and LMOM - pt will need to call office directly to cancel appointments. ? ? ? ?Pt wants to cancel appointments. ?

## 2021-05-02 DIAGNOSIS — M9904 Segmental and somatic dysfunction of sacral region: Secondary | ICD-10-CM | POA: Diagnosis not present

## 2021-05-02 DIAGNOSIS — M9903 Segmental and somatic dysfunction of lumbar region: Secondary | ICD-10-CM | POA: Diagnosis not present

## 2021-05-02 DIAGNOSIS — M542 Cervicalgia: Secondary | ICD-10-CM | POA: Diagnosis not present

## 2021-05-02 DIAGNOSIS — M9901 Segmental and somatic dysfunction of cervical region: Secondary | ICD-10-CM | POA: Diagnosis not present

## 2021-05-05 ENCOUNTER — Inpatient Hospital Stay: Payer: Medicare Other | Admitting: Family

## 2021-05-05 ENCOUNTER — Inpatient Hospital Stay: Payer: Medicare Other

## 2021-05-09 DIAGNOSIS — M542 Cervicalgia: Secondary | ICD-10-CM | POA: Diagnosis not present

## 2021-05-09 DIAGNOSIS — M9901 Segmental and somatic dysfunction of cervical region: Secondary | ICD-10-CM | POA: Diagnosis not present

## 2021-05-09 DIAGNOSIS — M9903 Segmental and somatic dysfunction of lumbar region: Secondary | ICD-10-CM | POA: Diagnosis not present

## 2021-05-09 DIAGNOSIS — M9904 Segmental and somatic dysfunction of sacral region: Secondary | ICD-10-CM | POA: Diagnosis not present

## 2021-05-23 DIAGNOSIS — M9901 Segmental and somatic dysfunction of cervical region: Secondary | ICD-10-CM | POA: Diagnosis not present

## 2021-05-23 DIAGNOSIS — M542 Cervicalgia: Secondary | ICD-10-CM | POA: Diagnosis not present

## 2021-05-23 DIAGNOSIS — M9903 Segmental and somatic dysfunction of lumbar region: Secondary | ICD-10-CM | POA: Diagnosis not present

## 2021-05-23 DIAGNOSIS — M9904 Segmental and somatic dysfunction of sacral region: Secondary | ICD-10-CM | POA: Diagnosis not present

## 2021-05-30 DIAGNOSIS — M9903 Segmental and somatic dysfunction of lumbar region: Secondary | ICD-10-CM | POA: Diagnosis not present

## 2021-05-30 DIAGNOSIS — M9901 Segmental and somatic dysfunction of cervical region: Secondary | ICD-10-CM | POA: Diagnosis not present

## 2021-05-30 DIAGNOSIS — M542 Cervicalgia: Secondary | ICD-10-CM | POA: Diagnosis not present

## 2021-05-30 DIAGNOSIS — M9904 Segmental and somatic dysfunction of sacral region: Secondary | ICD-10-CM | POA: Diagnosis not present

## 2021-06-13 ENCOUNTER — Other Ambulatory Visit (HOSPITAL_BASED_OUTPATIENT_CLINIC_OR_DEPARTMENT_OTHER): Payer: Self-pay

## 2021-06-16 ENCOUNTER — Telehealth: Payer: Self-pay

## 2021-06-16 NOTE — Telephone Encounter (Signed)
Given samples of Eliquis '5mg'$  #70 until renewal of PAP is completed.

## 2021-06-20 DIAGNOSIS — M9901 Segmental and somatic dysfunction of cervical region: Secondary | ICD-10-CM | POA: Diagnosis not present

## 2021-06-20 DIAGNOSIS — M542 Cervicalgia: Secondary | ICD-10-CM | POA: Diagnosis not present

## 2021-06-20 DIAGNOSIS — M9904 Segmental and somatic dysfunction of sacral region: Secondary | ICD-10-CM | POA: Diagnosis not present

## 2021-06-20 DIAGNOSIS — M9903 Segmental and somatic dysfunction of lumbar region: Secondary | ICD-10-CM | POA: Diagnosis not present

## 2021-06-27 ENCOUNTER — Telehealth: Payer: Self-pay

## 2021-06-27 DIAGNOSIS — M9903 Segmental and somatic dysfunction of lumbar region: Secondary | ICD-10-CM | POA: Diagnosis not present

## 2021-06-27 DIAGNOSIS — M9904 Segmental and somatic dysfunction of sacral region: Secondary | ICD-10-CM | POA: Diagnosis not present

## 2021-06-27 DIAGNOSIS — M9901 Segmental and somatic dysfunction of cervical region: Secondary | ICD-10-CM | POA: Diagnosis not present

## 2021-06-27 DIAGNOSIS — M542 Cervicalgia: Secondary | ICD-10-CM | POA: Diagnosis not present

## 2021-06-27 NOTE — Telephone Encounter (Signed)
LM to return my call, needing a copy of proof of income to attach to [facility] application for [employer].

## 2021-07-04 ENCOUNTER — Telehealth: Payer: Self-pay | Admitting: Cardiology

## 2021-07-04 DIAGNOSIS — M9903 Segmental and somatic dysfunction of lumbar region: Secondary | ICD-10-CM | POA: Diagnosis not present

## 2021-07-04 DIAGNOSIS — M542 Cervicalgia: Secondary | ICD-10-CM | POA: Diagnosis not present

## 2021-07-04 DIAGNOSIS — M9901 Segmental and somatic dysfunction of cervical region: Secondary | ICD-10-CM | POA: Diagnosis not present

## 2021-07-04 DIAGNOSIS — M9904 Segmental and somatic dysfunction of sacral region: Secondary | ICD-10-CM | POA: Diagnosis not present

## 2021-07-05 NOTE — Telephone Encounter (Signed)
Spoke with the patient, advised we actual need a copy of proof of income. Patient will bring on his next visit in 2 weeks.

## 2021-07-18 DIAGNOSIS — M542 Cervicalgia: Secondary | ICD-10-CM | POA: Diagnosis not present

## 2021-07-18 DIAGNOSIS — M9904 Segmental and somatic dysfunction of sacral region: Secondary | ICD-10-CM | POA: Diagnosis not present

## 2021-07-18 DIAGNOSIS — M9901 Segmental and somatic dysfunction of cervical region: Secondary | ICD-10-CM | POA: Diagnosis not present

## 2021-07-18 DIAGNOSIS — M9903 Segmental and somatic dysfunction of lumbar region: Secondary | ICD-10-CM | POA: Diagnosis not present

## 2021-07-19 ENCOUNTER — Ambulatory Visit (INDEPENDENT_AMBULATORY_CARE_PROVIDER_SITE_OTHER): Payer: Medicare Other | Admitting: Cardiology

## 2021-07-19 ENCOUNTER — Encounter: Payer: Self-pay | Admitting: Cardiology

## 2021-07-19 VITALS — BP 130/80 | HR 64 | Ht 69.0 in | Wt 208.0 lb

## 2021-07-19 DIAGNOSIS — I25110 Atherosclerotic heart disease of native coronary artery with unstable angina pectoris: Secondary | ICD-10-CM | POA: Diagnosis not present

## 2021-07-19 DIAGNOSIS — Z951 Presence of aortocoronary bypass graft: Secondary | ICD-10-CM | POA: Diagnosis not present

## 2021-07-19 DIAGNOSIS — I10 Essential (primary) hypertension: Secondary | ICD-10-CM | POA: Diagnosis not present

## 2021-07-19 DIAGNOSIS — I480 Paroxysmal atrial fibrillation: Secondary | ICD-10-CM

## 2021-07-19 NOTE — Progress Notes (Unsigned)
Cardiology Office Note:    Date:  07/19/2021   ID:  Luke [name], DOB [birth year], MRN [id number]  PCP:  Shelda Pal, DO  Cardiologist:  Jenne Campus, MD    Referring MD: Shelda Pal*   Chief Complaint  Patient presents with   Follow-up  Doing fine  History of Present Illness:    Luke [name] is a 88 y.o. male  with past medical history significant for coronary artery disease status post coronary bypass graft done more than 15 years ago.  Last cardiac catheterization performed in 2019 showed all patent grafts.  Also history of DVT as well as paroxysmal atrial fibrillation on Eliquis, dyslipidemia with intolerance to higher dosages of statin. Comes today to my office for follow-up he had multiple question questions about echocardiogram we analyzed his echocardiogram that he had done.  Echocardiogram showed preserved left ventricle ejection fraction however he did look at ejection fraction 60% he dropped 40% of his heart was going.  So we spent some time to clarify that.  He denies have any chest pain tightness squeezing pressure burning chest that started going to the gym on the regular basis with improving flexibility and muscle mass and enjoying it no palpitations no swelling of lower extremities  Past Medical History:  Diagnosis Date   Abnormal EKG    Atrial fibrillation ([place])    ATRIAL FIBRILLATION, HX OF 04/16/2008   Qualifier: Diagnosis of  By: Sidney Ace     CAD (coronary artery disease)     5 with the left internal mamary to the left anterior  descending coronary artery.   Chest pain 07/18/2016   Coronary artery disease involving native coronary artery of native heart with unstable angina pectoris ([place])    DEEP VENOUS THROMBOPHLEBITIS, HX OF 04/16/2008   Qualifier: Diagnosis of  By: Sidney Ace     DVT (deep venous thrombosis) ([place]) 02/06/2019   DVT femoral (deep venous thrombosis) with thrombophlebitis ([place])    Dyslipidemia    Dyslipidemia,  goal LDL below 70 04/16/2008   Qualifier: Diagnosis of  By: Sidney Ace     HTN (hypertension)    Hx of CABG    Hyperlipidemia 01/30/2012   Hypertension 04/16/2008   Qualifier: Diagnosis of  By: Sidney Ace     Open wound of right hand with tendon involvement 11/18/2016   Osteoarthritis 01/30/2012   Pseudophakia of right eye 12/18/2011   Formatting of this note might be different from the original. S/p Phaco/IOL OS with Alcon SN60WF 19.0D, wound zero, on 02/11/2012   Unstable angina [facility])     Past Surgical History:  Procedure Laterality Date   CORONARY ARTERY BYPASS GRAFT  2001   I & D EXTREMITY Right 11/18/2016   Procedure: IRRIGATION AND DEBRIDEMENT RIGHT LONG AND RING FINGER;  Surgeon: Netta Cedars, MD;  Location: [facility];  Service: Orthopedics;  Laterality: Right;   LEFT HEART CATH AND CORS/GRAFTS ANGIOGRAPHY N/A 07/19/2016   Procedure: Left Heart Cath and Cors/Grafts Angiography;  Surgeon: Jettie Booze, MD;  Location: [facility];  Service: Cardiovascular;  Laterality: N/A;    Current Medications: Current Meds  Medication Sig   amLODipine (NORVASC) 10 MG tablet Take 1 tablet (10 mg total) by mouth daily.   apixaban (ELIQUIS) 5 MG TABS tablet Take 1 tablet (5 mg total) by mouth 2 (two) times daily.   aspirin 81 MG tablet Take 81 mg by mouth daily.   atorvastatin (LIPITOR) 10 MG tablet One tablet three  times a week   benazepril (LOTENSIN) 40 MG tablet TAKE 1 TABLET BY MOUTH EVERY DAY   Coenzyme Q10 (COQ10) 200 MG CAPS Take 200 mg by mouth daily.   ezetimibe (ZETIA) 10 MG tablet Take 10 mg by mouth daily.   glucosamine-chondroitin 500-400 MG tablet Take 1 tablet by mouth daily.    Krill Oil 500 MG CAPS Take 500 mg by mouth daily.   metoprolol tartrate (LOPRESSOR) 25 MG tablet Take 1 tablet (25 mg total) by mouth 2 (two) times daily. Patient must schedule annual appointment for future refills   Multiple Vitamins-Minerals (CENTRUM SILVER PO) Take 1 tablet by mouth  daily. Unknown strength   OMEGA-3 FATTY ACIDS PO Take 1,200 mg by mouth 2 (two) times daily.     Allergies:   Statins   Social History   Socioeconomic History   Marital status: Widowed    Spouse name: Not on file   Number of children: Not on file   Years of education: Not on file   Highest education level: Not on file  Occupational History   Not on file  Tobacco Use   Smoking status: Former   Smokeless tobacco: Never   Tobacco comments:    stopped in the 1950's  Vaping Use   Vaping Use: Never used  Substance and Sexual Activity   Alcohol use: Yes    Alcohol/week: 0.0 standard drinks of alcohol    Comment: occ   Drug use: No   Sexual activity: Not on file  Other Topics Concern   Not on file  Social History Narrative   He lives in [place] with his wife.  He does not smoke.He drives about two hours a day which increases his risk for DVT.  He is retired.  He is a Psychologist, occupational at [employer].          Social Determinants of Health   Financial Resource Strain: Medium Risk (03/09/2021)   Overall Financial Resource Strain (CARDIA)    Difficulty of Paying Living Expenses: Somewhat hard  Food Insecurity: No Food Insecurity (03/09/2021)   Hunger Vital Sign    Worried About Running Out of Food in the Last Year: Never true    Ran Out of Food in the Last Year: Never true  Transportation Needs: No Transportation Needs (03/09/2021)   PRAPARE - Hydrologist (Medical): No    Lack of Transportation (Non-Medical): No  Physical Activity: Insufficiently Active (03/09/2021)   Exercise Vital Sign    Days of Exercise per Week: 3 days    Minutes of Exercise per Session: 30 min  Stress: No Stress Concern Present (03/09/2021)   [place]    Feeling of Stress : Not at all  Social Connections: Socially Isolated (03/09/2021)   Social Connection and Isolation Panel [NHANES]    Frequency of  Communication with Friends and Family: Three times a week    Frequency of Social Gatherings with Friends and Family: Three times a week    Attends Religious Services: Never    Active Member of Clubs or Organizations: No    Attends Archivist Meetings: Never    Marital Status: Widowed     Family History: The patient's family history includes Cancer in his mother; Coronary artery disease in an other family member; Heart attack in his father; Heart disease in his father and mother. ROS:   Please see the history of present illness.  All 14 point review of systems negative except as described per history of present illness  EKGs/Labs/Other Studies Reviewed:      Recent Labs: 11/04/2020: ALT 18; Hemoglobin 13.4; Platelet Count 166 01/27/2021: BUN 24; Creatinine, Ser 1.20; Potassium 5.0; Sodium 144  Recent Lipid Panel    Component Value Date/Time   CHOL 161 09/23/2019 0841   TRIG 178 (H) 09/23/2019 0841   TRIG 89 11/15/2005 0820   HDL 34 (L) 09/23/2019 0841   CHOLHDL 4.7 09/23/2019 0841   CHOLHDL 4.2 02/28/2015 1541   VLDL 23 02/28/2015 1541   LDLCALC 96 09/23/2019 0841   LDLDIRECT 81 01/27/2021 1106   LDLDIRECT 139.9 06/18/2006 1008    Physical Exam:    VS:  BP 130/80 (BP Location: Right Arm, Patient Position: Sitting, Cuff Size: Normal)   Pulse 64   Ht '5\' 9"'$  (1.753 m)   Wt 208 lb (94.3 kg)   SpO2 94%   BMI 30.72 kg/m     Wt Readings from Last 3 Encounters:  07/19/21 208 lb (94.3 kg)  04/24/21 207 lb 4 oz (94 kg)  03/09/21 209 lb 12.8 oz (95.2 kg)     GEN:  Well nourished, well developed in no acute distress HEENT: Normal NECK: No JVD; No carotid bruits LYMPHATICS: No lymphadenopathy CARDIAC: RRR, no murmurs, no rubs, no gallops RESPIRATORY:  Clear to auscultation without rales, wheezing or rhonchi  ABDOMEN: Soft, non-tender, non-distended MUSCULOSKELETAL:  No edema; No deformity  SKIN: Warm and dry LOWER EXTREMITIES: no swelling NEUROLOGIC:  Alert  and oriented x 3 PSYCHIATRIC:  Normal affect   ASSESSMENT:    1. Coronary artery disease involving native coronary artery of native heart with unstable angina pectoris ([place])   2. Primary hypertension   3. Hx of CABG   4. Paroxysmal atrial fibrillation (HCC)    PLAN:    In order of problems listed above:  Coronary disease stable from that point review on appropriate medication we discussed issue of aspirin and told him if the situation since there was no recent coronary event and he is already anticoagulated aspirin is not a must however he is scared of getting heart attack and prefers to continue. Essential hypertension blood pressure well controlled continue current management Paroxysmal atrial fibrillation, seems to maintain sinus rhythm.  Continue anticoagulation appropriate dose which is 5 mg twice daily Dyslipidemia I did review his K PN which show me his LDL of 81 HDL 34.  We will continue present management.  He is on moderate intensity statin only 10 mg daily, he does have difficulty tolerating higher dosages of medications Mild to moderate mitral regurgitation on echocardiogram hemodynamic stable, compensated continue monitoring   Medication Adjustments/Labs and Tests Ordered: Current medicines are reviewed at length with the patient today.  Concerns regarding medicines are outlined above.  No orders of the defined types were placed in this encounter.  Medication changes: No orders of the defined types were placed in this encounter.   Signed, Park Liter, MD, [facility] 07/19/2021 1:49 PM    [place]

## 2021-07-19 NOTE — Patient Instructions (Signed)

## 2021-08-08 DIAGNOSIS — M9901 Segmental and somatic dysfunction of cervical region: Secondary | ICD-10-CM | POA: Diagnosis not present

## 2021-08-08 DIAGNOSIS — M9903 Segmental and somatic dysfunction of lumbar region: Secondary | ICD-10-CM | POA: Diagnosis not present

## 2021-08-08 DIAGNOSIS — M9904 Segmental and somatic dysfunction of sacral region: Secondary | ICD-10-CM | POA: Diagnosis not present

## 2021-08-08 DIAGNOSIS — M5442 Lumbago with sciatica, left side: Secondary | ICD-10-CM | POA: Diagnosis not present

## 2021-08-23 DIAGNOSIS — M9903 Segmental and somatic dysfunction of lumbar region: Secondary | ICD-10-CM | POA: Diagnosis not present

## 2021-08-23 DIAGNOSIS — M9901 Segmental and somatic dysfunction of cervical region: Secondary | ICD-10-CM | POA: Diagnosis not present

## 2021-08-23 DIAGNOSIS — M5442 Lumbago with sciatica, left side: Secondary | ICD-10-CM | POA: Diagnosis not present

## 2021-08-23 DIAGNOSIS — M9904 Segmental and somatic dysfunction of sacral region: Secondary | ICD-10-CM | POA: Diagnosis not present

## 2021-08-30 DIAGNOSIS — M9903 Segmental and somatic dysfunction of lumbar region: Secondary | ICD-10-CM | POA: Diagnosis not present

## 2021-08-30 DIAGNOSIS — M9901 Segmental and somatic dysfunction of cervical region: Secondary | ICD-10-CM | POA: Diagnosis not present

## 2021-08-30 DIAGNOSIS — M5442 Lumbago with sciatica, left side: Secondary | ICD-10-CM | POA: Diagnosis not present

## 2021-08-30 DIAGNOSIS — M9904 Segmental and somatic dysfunction of sacral region: Secondary | ICD-10-CM | POA: Diagnosis not present

## 2021-09-06 DIAGNOSIS — M5442 Lumbago with sciatica, left side: Secondary | ICD-10-CM | POA: Diagnosis not present

## 2021-09-06 DIAGNOSIS — M9904 Segmental and somatic dysfunction of sacral region: Secondary | ICD-10-CM | POA: Diagnosis not present

## 2021-09-06 DIAGNOSIS — M9903 Segmental and somatic dysfunction of lumbar region: Secondary | ICD-10-CM | POA: Diagnosis not present

## 2021-09-06 DIAGNOSIS — M9901 Segmental and somatic dysfunction of cervical region: Secondary | ICD-10-CM | POA: Diagnosis not present

## 2021-09-13 DIAGNOSIS — M5442 Lumbago with sciatica, left side: Secondary | ICD-10-CM | POA: Diagnosis not present

## 2021-09-13 DIAGNOSIS — M9901 Segmental and somatic dysfunction of cervical region: Secondary | ICD-10-CM | POA: Diagnosis not present

## 2021-09-13 DIAGNOSIS — M9904 Segmental and somatic dysfunction of sacral region: Secondary | ICD-10-CM | POA: Diagnosis not present

## 2021-09-13 DIAGNOSIS — M9903 Segmental and somatic dysfunction of lumbar region: Secondary | ICD-10-CM | POA: Diagnosis not present

## 2021-09-20 DIAGNOSIS — M9903 Segmental and somatic dysfunction of lumbar region: Secondary | ICD-10-CM | POA: Diagnosis not present

## 2021-09-20 DIAGNOSIS — M9904 Segmental and somatic dysfunction of sacral region: Secondary | ICD-10-CM | POA: Diagnosis not present

## 2021-09-20 DIAGNOSIS — M9901 Segmental and somatic dysfunction of cervical region: Secondary | ICD-10-CM | POA: Diagnosis not present

## 2021-09-20 DIAGNOSIS — M5442 Lumbago with sciatica, left side: Secondary | ICD-10-CM | POA: Diagnosis not present

## 2021-09-20 IMAGING — US US EXTREM LOW VENOUS*L*
1 series · 13 of 24 positions shown · non-contrast
Comparison: None.

CLINICAL DATA: Left leg pain and swelling



[Series 1: us extrem low venous*left* · 13 of 34 slices shown]
[im 1/34]
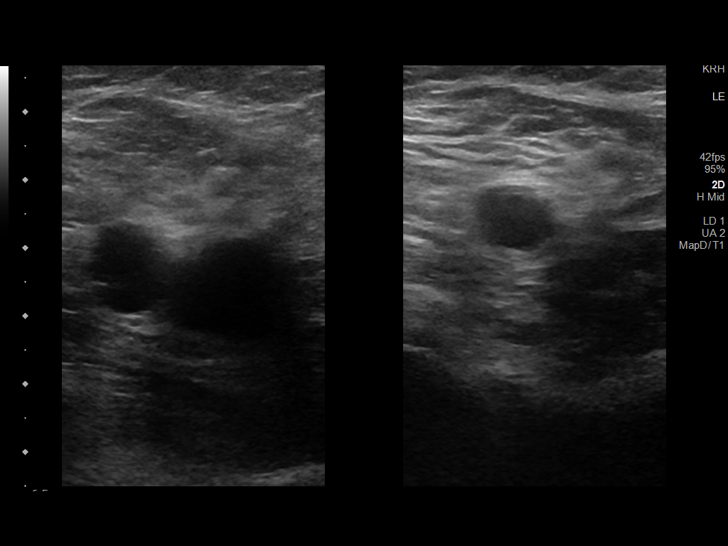
[im 3/34]
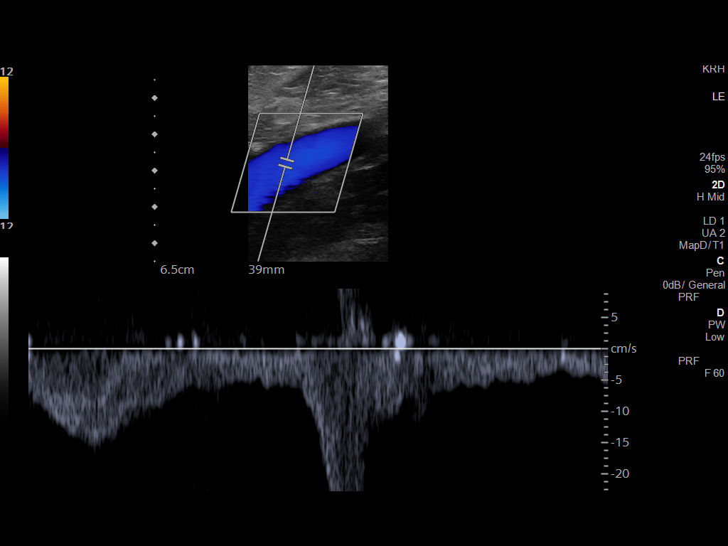
[im 6/34]
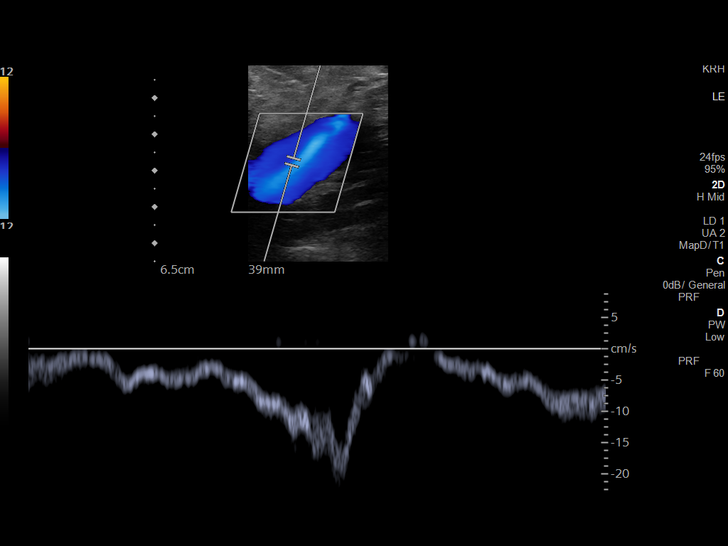
[im 9/34]
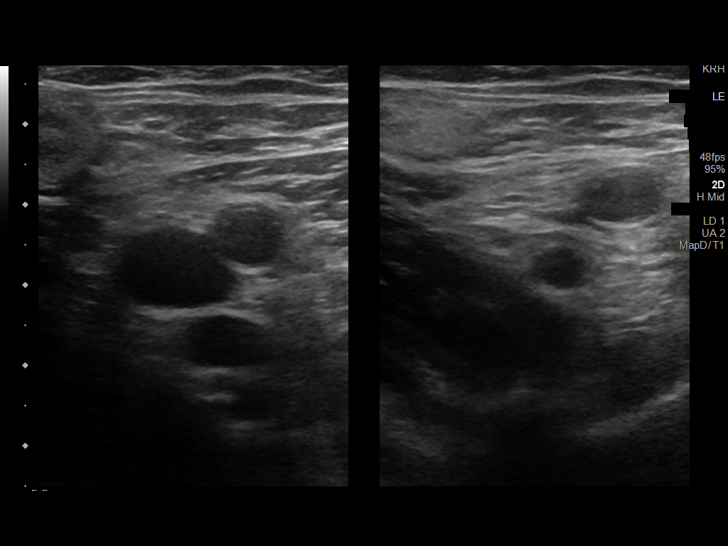
[im 12/34]
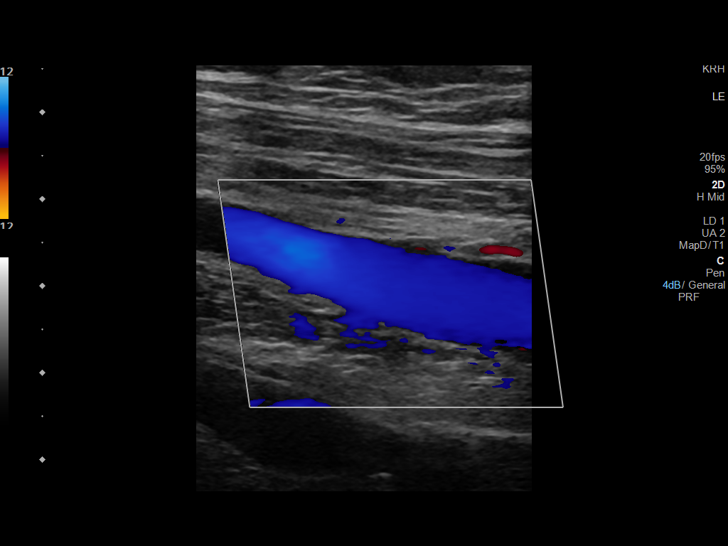
[im 15/34]
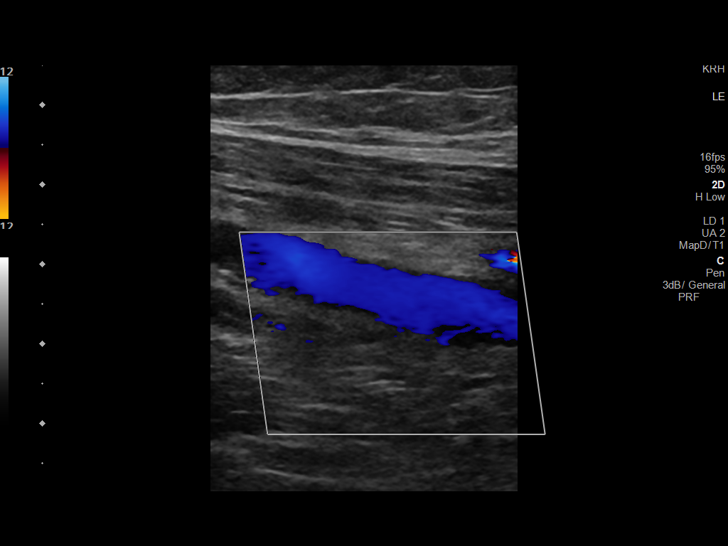
[im 18/34]
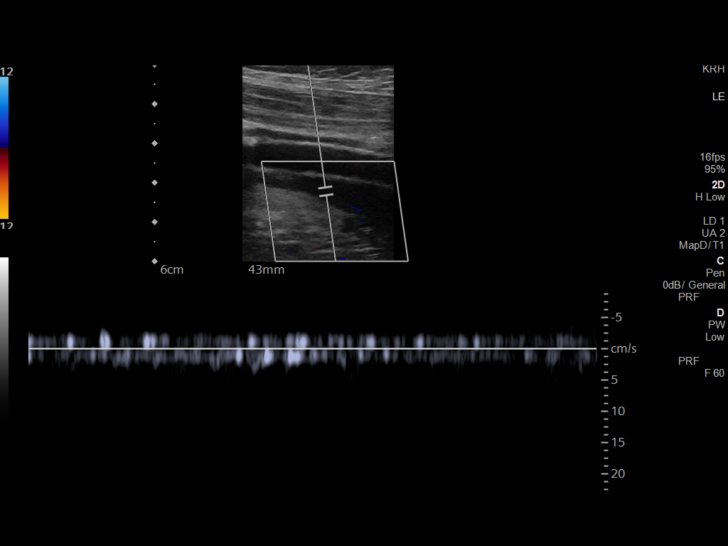
[im 19/34]
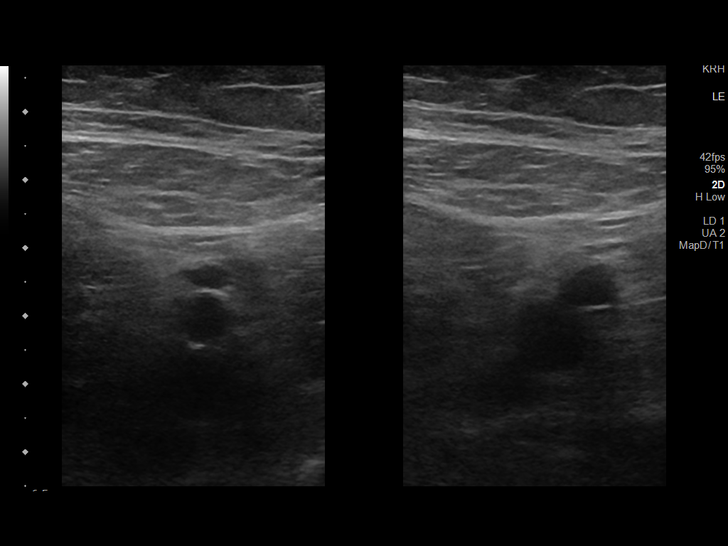
[im 22/34]
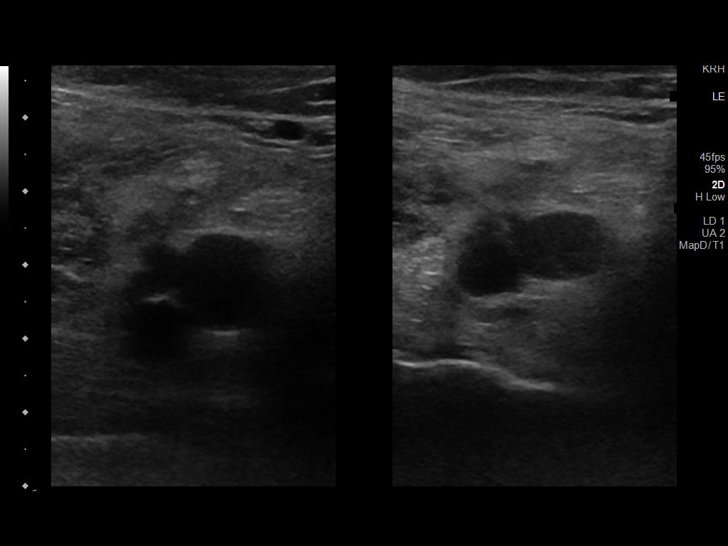
[im 25/34]
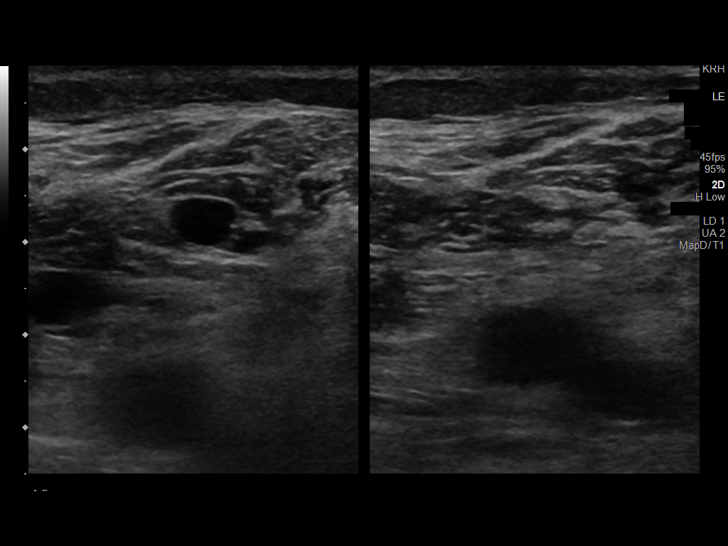
[im 28/34]
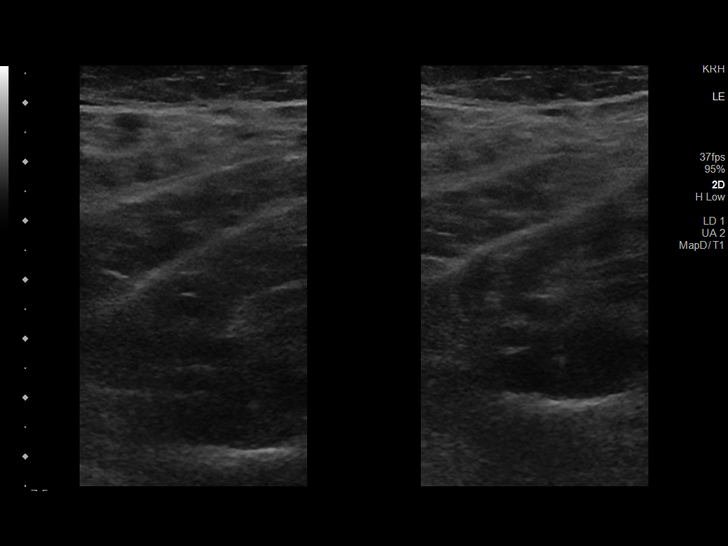
[im 31/34]
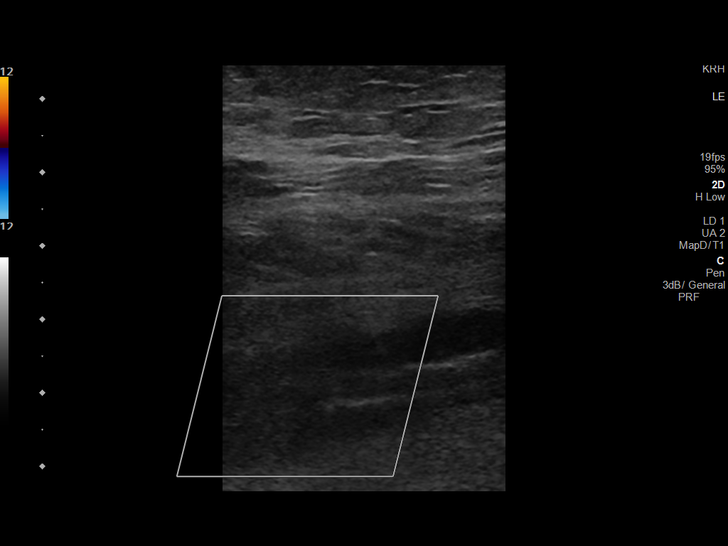
[im 34/34]
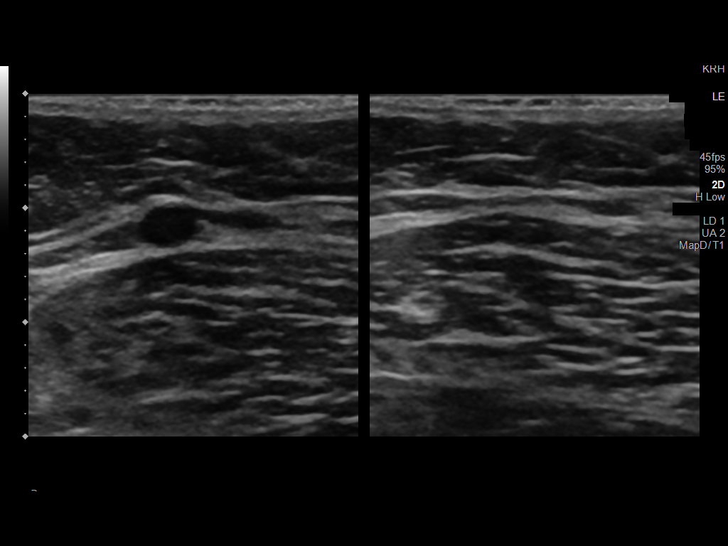

[13 of 24 positions shown; findings below may reference images not displayed]

FINDINGS: Contralateral Common Femoral Vein: Respiratory phasicity is normal
and symmetric with the symptomatic side. No evidence of thrombus.
Normal compressibility.

Common Femoral Vein: No evidence of thrombus. Normal
compressibility, respiratory phasicity and response to augmentation.

Saphenofemoral Junction: No evidence of thrombus. Normal
compressibility and flow on color Doppler imaging.

Profunda Femoral Vein: No evidence of thrombus. Normal
compressibility and flow on color Doppler imaging.

Femoral Vein: There is occlusive and nonocclusive thrombus within a
duplicated left femoral vein from the midportion and distally.

Popliteal Vein: There is occlusive thrombus within the popliteal
vein.

Calf Veins: Evaluation of the calf veins was limited. There is
occlusive thrombus within the peroneal vein.

Superficial Great Saphenous Vein: No evidence of thrombus. Normal
compressibility.

Venous Reflux:  Not evaluated.

Other Findings:  Lower extremity edema is noted.
IMPRESSION: 1. Extensive occlusive and nonocclusive thrombus from the mid
femoral vein extending into the popliteal vein and left peroneal
vein.
2. Lower extremity edema is noted.

## 2021-10-23 DIAGNOSIS — Z23 Encounter for immunization: Secondary | ICD-10-CM | POA: Diagnosis not present

## 2021-11-04 ENCOUNTER — Other Ambulatory Visit: Payer: Self-pay | Admitting: Cardiology

## 2021-12-01 ENCOUNTER — Other Ambulatory Visit: Payer: Self-pay | Admitting: Cardiology

## 2021-12-07 ENCOUNTER — Other Ambulatory Visit: Payer: Self-pay | Admitting: Cardiology

## 2021-12-12 ENCOUNTER — Emergency Department (HOSPITAL_BASED_OUTPATIENT_CLINIC_OR_DEPARTMENT_OTHER): Payer: Medicare Other

## 2021-12-12 ENCOUNTER — Encounter (HOSPITAL_BASED_OUTPATIENT_CLINIC_OR_DEPARTMENT_OTHER): Payer: Self-pay | Admitting: Emergency Medicine

## 2021-12-12 ENCOUNTER — Emergency Department (HOSPITAL_BASED_OUTPATIENT_CLINIC_OR_DEPARTMENT_OTHER)
Admission: EM | Admit: 2021-12-12 | Discharge: 2021-12-12 | Disposition: A | Discharge status: Home / Self Care | Payer: Medicare Other | Attending: Emergency Medicine | Admitting: Emergency Medicine

## 2021-12-12 ENCOUNTER — Other Ambulatory Visit: Payer: Self-pay

## 2021-12-12 DIAGNOSIS — Z7901 Long term (current) use of anticoagulants: Secondary | ICD-10-CM | POA: Diagnosis not present

## 2021-12-12 DIAGNOSIS — M79672 Pain in left foot: Secondary | ICD-10-CM | POA: Insufficient documentation

## 2021-12-12 DIAGNOSIS — Z7982 Long term (current) use of aspirin: Secondary | ICD-10-CM | POA: Insufficient documentation

## 2021-12-12 LAB — BASIC METABOLIC PANEL
Anion gap: 7 (ref 5–15)
BUN: 22 mg/dL (ref 8–23)
CO2: 24 mmol/L (ref 22–32)
Calcium: 9.3 mg/dL (ref 8.9–10.3)
Chloride: 107 mmol/L (ref 98–111)
Creatinine, Ser: 1.26 mg/dL — ABNORMAL HIGH (ref 0.61–1.24)
GFR, Estimated: 55 mL/min — ABNORMAL LOW (ref >60)
Glucose, Bld: 97 mg/dL (ref 70–99)
Potassium: 4.6 mmol/L (ref 3.5–5.1)
Sodium: 138 mmol/L (ref 135–145)

## 2021-12-12 LAB — CBC
HCT: 39.8 % (ref 39.0–52.0)
Hemoglobin: 13.5 g/dL (ref 13.0–17.0)
MCH: 32.7 pg (ref 26.0–34.0)
MCHC: 33.9 g/dL (ref 30.0–36.0)
MCV: 96.4 fL (ref 80.0–100.0)
Platelets: 159 10*3/uL (ref 150–400)
RBC: 4.13 MIL/uL — ABNORMAL LOW (ref 4.22–5.81)
RDW: 13 % (ref 11.5–15.5)
WBC: 8.4 10*3/uL (ref 4.0–10.5)
nRBC: 0 % (ref 0.0–0.2)

## 2021-12-12 LAB — D-DIMER, QUANTITATIVE: D-Dimer, Quant: 0.58 ug/mL-FEU — ABNORMAL HIGH (ref 0.00–0.50)

## 2021-12-12 NOTE — Discharge Instructions (Signed)
If you are unable to walk, develop fever, redness, leg swelling, or any other new/concerning symptoms and return to the ER for evaluation.

## 2021-12-12 NOTE — ED Provider Notes (Signed)
[facility] EMERGENCY DEPARTMENT Provider Note   CSN: [id number] Arrival date & time: 12/12/21  1315     History  Chief Complaint  Patient presents with   Foot Pain    Luke [name] is a 88 y.o. male.  HPI 88 year old male with a history of a DVT in the right lower extremity who is currently on Eliquis presents with left foot pain.  Started 2 days ago when he first woke up.  He does not remember any injury.  He states that when he first got out of bed and put his weight on his foot he developed pain.  He has pain only when he bears weight.  It is in the plantar aspect of his foot.  He denies any leg/calf swelling or foot swelling.  Family was concerned that he was having a recurrent DVT.  Home Medications Prior to Admission medications   Medication Sig Start Date End Date Taking? Authorizing Provider  amLODipine (NORVASC) 10 MG tablet Take 1 tablet (10 mg total) by mouth daily. 01/27/21   Park Liter, MD  apixaban (ELIQUIS) 5 MG TABS tablet Take 1 tablet (5 mg total) by mouth 2 (two) times daily. 01/31/21   Park Liter, MD  aspirin 81 MG tablet Take 81 mg by mouth daily.    [provider]  atorvastatin (LIPITOR) 10 MG tablet One tablet three times a week 01/27/21   Park Liter, MD  benazepril (LOTENSIN) 40 MG tablet TAKE 1 TABLET BY MOUTH EVERY DAY 04/18/21   Park Liter, MD  Coenzyme Q10 (COQ10) 200 MG CAPS Take 200 mg by mouth daily.    [provider]  ezetimibe (ZETIA) 10 MG tablet Take 1 tablet (10 mg total) by mouth daily. 12/07/21   Park Liter, MD  glucosamine-chondroitin 500-400 MG tablet Take 1 tablet by mouth daily.     [provider]  Javier Docker Oil 500 MG CAPS Take 500 mg by mouth daily.    [provider]  metoprolol tartrate (LOPRESSOR) 25 MG tablet TAKE 1 TABLET (25 MG TOTAL) BY MOUTH 2 (TWO) TIMES DAILY. PATIENT MUST SCHEDULE ANNUAL APPOINTMENT FOR FUTURE REFILLS 11/07/21   Park Liter, MD  Multiple Vitamins-Minerals (CENTRUM SILVER PO) Take 1 tablet by mouth daily. Unknown strength    [provider]  OMEGA-3 FATTY ACIDS PO Take 1,200 mg by mouth 2 (two) times daily.    [provider]      Allergies    Statins    Review of Systems   Review of Systems  Cardiovascular:  Negative for leg swelling.  Musculoskeletal:  Positive for arthralgias. Negative for joint swelling.    Physical Exam Updated Vital Signs BP (!) 160/65   Pulse (!) 57   Temp 98 F (36.7 C) (Oral)   Resp 20   Wt 90.3 kg   SpO2 95%   BMI 29.39 kg/m  Physical Exam Vitals and nursing note reviewed.  Constitutional:      Appearance: He is well-developed.  HENT:     Head: Normocephalic and atraumatic.  Cardiovascular:     Rate and Rhythm: Normal rate and regular rhythm.     Pulses:          Dorsalis pedis pulses are 2+ on the left side.  Pulmonary:     Effort: Pulmonary effort is normal.  Abdominal:     General: There is no distension.  Musculoskeletal:     Left upper leg: No edema  or tenderness.     Left lower leg: No swelling or tenderness.     Left ankle: No tenderness. Normal range of motion.     Left Achilles Tendon: No tenderness or defects.     Left foot: Normal range of motion. Tenderness present. No swelling.       Feet:  Skin:    General: Skin is warm and dry.  Neurological:     Mental Status: He is alert.     ED Results / Procedures / Treatments   Labs (all labs ordered are listed, but only abnormal results are displayed) Labs Reviewed  BASIC METABOLIC PANEL - Abnormal; Notable for the following components:      Result Value   Creatinine, Ser 1.26 (*)    GFR, Estimated 55 (*)    All other components within normal limits  CBC - Abnormal; Notable for the following components:   RBC 4.13 (*)    All other components within normal limits  D-DIMER, QUANTITATIVE - Abnormal; Notable for the following components:   D-Dimer, Quant 0.58 (*)     All other components within normal limits    EKG None  Radiology DG Foot Complete Left  Result Date: 12/12/2021 CLINICAL DATA:  Left foot pain for 5 days. EXAM: LEFT FOOT - COMPLETE 3+ VIEW COMPARISON:  None Available. FINDINGS: Mild hallux valgus. Mild great toe metatarsophalangeal joint space narrowing and peripheral osteophytosis. Large plantar calcaneal heel spur. Mild linear calcification at the plantar fascia origin just plantar to the calcaneal heel spur. Moderate distal dorsal talar neck degenerative osteophytosis. Mild to moderate navicular-cuneiform degenerative spurring. No acute fracture is seen.  No dislocation. IMPRESSION: 1. Mild great toe metatarsophalangeal and navicular-cuneiform osteoarthritis. 2. Large plantar calcaneal heel spur. Electronically Signed   By: Yvonne Kendall M.D.   On: 12/12/2021 14:51    Procedures Procedures    Medications Ordered in ED Medications - No data to display  ED Course/ Medical Decision Making/ A&P                           Medical Decision Making Amount and/or Complexity of Data Reviewed Labs: ordered.    Details: Labs show no leukocytosis.  Age-adjusted D-dimer negative. Radiology: ordered and independent interpretation performed.    Details: Heel spur but no fractures.   Patient presents with atraumatic left plantar foot pain.  Could be Planter fasciitis versus other nonspecific muscular/tendinous pain.  X-ray images viewed/interpreted myself and are unremarkable besides the heel spur.  Family was concerned about possible DVT but he has no calf pain or swelling and no swelling in the foot.  This very low suspicion based on presentation for DVT combined with the D-dimer that was sent in triage makes DVT pretty unlikely.  I do not think ultrasound is needed.  Will give a boot for comfort and have him follow-up with podiatry.  Unfortunately he cannot take NSAIDs as he is on Eliquis (has been compliant).  Otherwise discussed staying off his  foot unless necessary and taking Tylenol.  Doubt infection.        Final Clinical Impression(s) / ED Diagnoses Final diagnoses:  Acute pain of left foot    Rx / DC Orders ED Discharge Orders     None         Sherwood Gambler, MD 12/12/21 [phone]

## 2021-12-12 NOTE — ED Triage Notes (Signed)
Pt complains of left foot pain x 5 days. Denies any injury. Unable to apply weight to left foot. Pt has history of blood clots and wanted to make sure that he doesn't have a clot.

## 2021-12-13 ENCOUNTER — Telehealth: Payer: Self-pay | Admitting: Podiatry

## 2021-12-13 NOTE — Telephone Encounter (Signed)
Left message on voicemail for patient to call back if he would like a follow up appointment.

## 2021-12-14 ENCOUNTER — Other Ambulatory Visit: Payer: Self-pay | Admitting: Cardiology

## 2021-12-25 ENCOUNTER — Telehealth: Payer: Self-pay

## 2021-12-25 NOTE — Telephone Encounter (Signed)
I follow up with the patient about his application we tried to complete in June. I was expecting a copy of proof of income but never receive. He states he stop by the [facility] location and gave proof of income to a male nurse (unknown name). I stated we only have 1 male nurse and I recall obtaining this information from him. Patient decided to not proceed with application

## 2022-02-02 ENCOUNTER — Other Ambulatory Visit: Payer: Self-pay | Admitting: Cardiology

## 2022-02-02 DIAGNOSIS — I82402 Acute embolism and thrombosis of unspecified deep veins of left lower extremity: Secondary | ICD-10-CM

## 2022-02-02 NOTE — Telephone Encounter (Signed)
Prescription refill request for Eliquis received. Indication: Afib  Last office visit: 07/19/21 Agustin Cree)  Scr: 1.26 (12/12/21)  Age: 89 Weight: 90.3kg  Appropriate dose. Refill sent.

## 2022-02-28 ENCOUNTER — Telehealth: Payer: Self-pay | Admitting: Family Medicine

## 2022-02-28 NOTE — Telephone Encounter (Signed)
ERROR

## 2022-02-28 NOTE — Telephone Encounter (Signed)
Contacted Harrie Foreman to schedule their annual wellness visit. Appointment made for 03/12/2022.  [facility] Direct Dial: [phone]

## 2022-03-14 ENCOUNTER — Ambulatory Visit: Payer: Medicare Other | Admitting: Cardiology

## 2022-03-16 ENCOUNTER — Other Ambulatory Visit: Payer: Self-pay | Admitting: Cardiology

## 2022-03-16 NOTE — Telephone Encounter (Signed)
Refill to pharmacy 

## 2022-03-20 ENCOUNTER — Ambulatory Visit (INDEPENDENT_AMBULATORY_CARE_PROVIDER_SITE_OTHER): Payer: Medicare Other | Admitting: *Deleted

## 2022-03-20 DIAGNOSIS — Z0000 Encounter for general adult medical examination without abnormal findings: Secondary | ICD-10-CM | POA: Diagnosis not present

## 2022-03-20 NOTE — Progress Notes (Signed)
Subjective:   Luke [name] is a 89 y.o. male who presents for Medicare Annual/Subsequent preventive examination.  I connected with  Harrie Foreman on 03/20/22 by a audio enabled telemedicine application and verified that I am speaking with the correct person using two identifiers.  Patient Location: Home  Provider Location: Office/Clinic  I discussed the limitations of evaluation and management by telemedicine. The patient expressed understanding and agreed to proceed.   Review of Systems     Cardiac Risk Factors include: advanced age (>87mn, >>53women);male gender;dyslipidemia;hypertension     Objective:    There were no vitals filed for this visit. There is no height or weight on file to calculate BMI.     03/20/2022    1:07 PM 12/12/2021    1:48 PM 03/09/2021    1:49 PM 11/04/2020    3:03 PM 03/03/2020    2:13 PM 11/06/2019    2:16 PM 08/06/2019   11:53 AM  Advanced Directives  Does Patient Have a Medical Advance Directive? Yes Yes Yes Yes Yes Yes Yes  Type of AParamedicof [place]ardiniaLiving will [place] will [place]Living will Living will;Healthcare Power of [place]Living will Living will;Healthcare Power of [place]ber[place] will  Does patient want to make changes to medical advance directive?    No - Patient declined  No - Patient declined No - Patient declined  Copy of HA[place]in Chart? No - copy requested No - copy requested No - copy requested  No - copy requested  No - copy requested  Would patient like information on creating a medical advance directive?       No - Patient declined    Current Medications (verified) Outpatient Encounter Medications as of 03/20/2022  Medication Sig   amLODipine (NORVASC) 10 MG tablet TAKE 1 TABLET BY MOUTH EVERY DAY   apixaban (ELIQUIS) 5 MG TABS tablet Take 1 tablet (5 mg total) by  mouth 2 (two) times daily.   aspirin 81 MG tablet Take 81 mg by mouth daily.   atorvastatin (LIPITOR) 10 MG tablet One tablet three times a week (Patient taking differently: Take 20 mg by mouth daily. One tablet three times a week)   benazepril (LOTENSIN) 40 MG tablet Take 1 tablet (40 mg total) by mouth daily.   Coenzyme Q10 (COQ10) 200 MG CAPS Take 200 mg by mouth daily.   ezetimibe (ZETIA) 10 MG tablet Take 1 tablet (10 mg total) by mouth daily.   glucosamine-chondroitin 500-400 MG tablet Take 1 tablet by mouth daily.    Krill Oil 500 MG CAPS Take 500 mg by mouth daily.   metoprolol tartrate (LOPRESSOR) 25 MG tablet Take 1 tablet (25 mg total) by mouth 2 (two) times daily.   Multiple Vitamins-Minerals (CENTRUM SILVER PO) Take 1 tablet by mouth daily. Unknown strength   OMEGA-3 FATTY ACIDS PO Take 1,200 mg by mouth 2 (two) times daily.   [DISCONTINUED] apixaban (ELIQUIS) 5 MG TABS tablet Take 1 tablet (5 mg total) by mouth 2 (two) times daily.   No facility-administered encounter medications on file as of 03/20/2022.    Allergies (verified) Statins   History: Past Medical History:  Diagnosis Date   Abnormal EKG    Atrial fibrillation ([place]    ATRIAL FIBRILLATION, HX OF 04/16/2008   Qualifier: Diagnosis of  By: JSidney Ace    CAD (coronary artery disease)  5 with the left internal mamary to the left anterior  descending coronary artery.   Chest pain 07/18/2016   Coronary artery disease involving native coronary artery of native heart with unstable angina pectoris ([place])    DEEP VENOUS THROMBOPHLEBITIS, HX OF 04/16/2008   Qualifier: Diagnosis of  By: Sidney Ace     DVT (deep venous thrombosis) ([place]) 02/06/2019   DVT femoral (deep venous thrombosis) with thrombophlebitis ([place])    Dyslipidemia    Dyslipidemia, goal LDL below 70 04/16/2008   Qualifier: Diagnosis of  By: Sidney Ace     HTN (hypertension)    Hx of CABG    Hyperlipidemia 01/30/2012   Hypertension 04/16/2008    Qualifier: Diagnosis of  By: Sidney Ace     Open wound of right hand with tendon involvement 11/18/2016   Osteoarthritis 01/30/2012   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 S/p Phaco/IOL OS with Alcon SN60WF 19.0D, wound zero, on 02/11/2012   Unstable angina [facility])    Past Surgical History:  Procedure Laterality Date   CORONARY ARTERY BYPASS GRAFT  2001   I & D EXTREMITY Right 11/18/2016   Procedure: IRRIGATION AND DEBRIDEMENT RIGHT LONG AND RING FINGER;  Surgeon: Netta Cedars, MD;  Location: [facility];  Service: Orthopedics;  Laterality: Right;   LEFT HEART CATH AND CORS/GRAFTS ANGIOGRAPHY N/A 07/19/2016   Procedure: Left Heart Cath and Cors/Grafts Angiography;  Surgeon: Jettie Booze, MD;  Location: [facility];  Service: Cardiovascular;  Laterality: N/A;   Family History  Problem Relation Age of Onset   Heart disease Mother    Cancer Mother    Heart disease Father    Heart attack Father    Coronary artery disease Other    Social History   Socioeconomic History   Marital status: Widowed    Spouse name: Not on file   Number of children: Not on file   Years of education: Not on file   Highest education level: Not on file  Occupational History   Not on file  Tobacco Use   Smoking status: Former   Smokeless tobacco: Never   Tobacco comments:    stopped in the 1950's  Vaping Use   Vaping Use: Never used  Substance and Sexual Activity   Alcohol use: Yes    Alcohol/week: 0.0 standard drinks of alcohol    Comment: occ   Drug use: No   Sexual activity: Not on file  Other Topics Concern   Not on file  Social History Narrative   He lives in [place] with his wife.  He does not smoke.He drives about two hours a day which increases his risk for DVT.  He is retired.  He is a Psychologist, occupational at [employer].          Social Determinants of Health   Financial Resource Strain: Medium Risk  (03/09/2021)   Overall Financial Resource Strain (CARDIA)    Difficulty of Paying Living Expenses: Somewhat hard  Food Insecurity: No Food Insecurity (03/20/2022)   Hunger Vital Sign    Worried About Running Out of Food in the Last Year: Never true    Ran Out of Food in the Last Year: Never true  Transportation Needs: No Transportation Needs (03/20/2022)   PRAPARE - Hydrologist (Medical): No    Lack of Transportation (Non-Medical): No  Physical Activity: Insufficiently Active (03/09/2021)   Exercise Vital  Sign    Days of Exercise per Week: 3 days    Minutes of Exercise per Session: 30 min  Stress: No Stress Concern Present (03/09/2021)   [place]    Feeling of Stress : Not at all  Social Connections: Socially Isolated (03/09/2021)   Social Connection and Isolation Panel [NHANES]    Frequency of Communication with Friends and Family: Three times a week    Frequency of Social Gatherings with Friends and Family: Three times a week    Attends Religious Services: Never    Active Member of Clubs or Organizations: No    Attends Archivist Meetings: Never    Marital Status: Widowed    Tobacco Counseling Counseling given: Not Answered Tobacco comments: stopped in the 1950's   Clinical Intake:  Pre-visit preparation completed: Yes  Pain : No/denies pain  Diabetes: No  How often do you need to have someone help you when you read instructions, pamphlets, or other written materials from your doctor or pharmacy?: 1 - Never  Activities of Daily Living    03/20/2022    1:11 PM  In your present state of health, do you have any difficulty performing the following activities:  Hearing? 0  Vision? 1  Difficulty concentrating or making decisions? 1  Walking or climbing stairs? 0  Dressing or bathing? 0  Doing errands, shopping? 0  Preparing Food and eating ? N  Using the Toilet? N  In  the past six months, have you accidently leaked urine? Y  Do you have problems with loss of bowel control? N  Managing your Medications? N  Managing your Finances? N  Housekeeping or managing your Housekeeping? N    Patient Care Team: Shelda Pal, DO as PCP - General (Family Medicine) Berniece Salines, DO as PCP - Cardiology (Cardiology)  Indicate any recent Medical Services you may have received from other than [facility] providers in the past year (date may be approximate).     Assessment:   This is a routine wellness examination for [facility].  Hearing/Vision screen No results found.  Dietary issues and exercise activities discussed: Current Exercise Habits: The patient does not participate in regular exercise at present, Exercise limited by: Other - see comments (arthritis)   Goals Addressed   None    Depression Screen    03/20/2022    1:10 PM 03/09/2021    1:51 PM 03/03/2020    2:16 PM  PHQ 2/9 Scores  PHQ - 2 Score 0 1 1    Fall Risk    03/20/2022    1:07 PM 03/09/2021    1:50 PM 03/03/2020    2:14 PM 02/02/2019   10:53 AM 09/15/2015    3:33 PM  Fall Risk   Falls in the past year? 0  1 0 No  Comment     Emmi Telephone Survey: data to providers prior to load  Number falls in past yr: 0 0 0 0   Injury with Fall? 0 0 1 0   Risk for fall due to : Impaired balance/gait  History of fall(s)    Follow up Falls evaluation completed Falls prevention discussed Falls prevention discussed      FALL RISK PREVENTION PERTAINING TO THE HOME:  Any stairs in or around the home? Yes  If so, are there any without handrails? No  Home free of loose throw rugs in walkways, pet beds, electrical cords, etc? Yes  Adequate lighting in  your home to reduce risk of falls? Yes   ASSISTIVE DEVICES UTILIZED TO PREVENT FALLS:  Life alert? No  Use of a cane, walker or w/c? No  Grab bars in the bathroom? Yes  Shower chair or bench in shower?  Built in  Elevated toilet seat or a handicapped  toilet? Yes   TIMED UP AND GO:  Was the test performed?  No, audio visit .    Cognitive Function:        03/20/2022    1:21 PM  6CIT Screen  What Year? 0 points  What month? 0 points  What time? 0 points  Count back from 20 0 points  Months in reverse 2 points  Repeat phrase 0 points  Total Score 2 points    Immunizations Immunization History  Administered Date(s) Administered   Influenza, High Dose Seasonal PF 10/31/2016, 09/27/2017, 09/28/2017, 10/04/2018, 10/23/2021   Influenza-Unspecified 11/07/2019, 11/27/2020   PFIZER(Purple Top)SARS-COV-2 Vaccination 02/13/2019, 03/10/2019, 10/06/2019   Pfizer Covid-19 Vaccine Bivalent Booster 35yr & up 01/12/2021   Pneumococcal Conjugate-13 12/07/2015   Pneumococcal Polysaccharide-23 10/31/2016   Td 01/09/2004   Tdap 11/16/2016    TDAP status: Up to date  Flu Vaccine status: Up to date  Pneumococcal vaccine status: Up to date  Covid-19 vaccine status: Information provided on how to obtain vaccines.   Qualifies for Shingles Vaccine? Yes   Zostavax completed No   Shingrix Completed?: No.    Education has been provided regarding the importance of this vaccine. Patient has been advised to call insurance company to determine out of pocket expense if they have not yet received this vaccine. Advised may also receive vaccine at local pharmacy or Health Dept. Verbalized acceptance and understanding.  Screening Tests Health Maintenance  Topic Date Due   Zoster Vaccines- Shingrix (1 of 2) Never done   COVID-19 Vaccine (5 - 2023-24 season) 09/08/2021   Medicare Annual Wellness (AWV)  03/10/2022   DTaP/Tdap/Td (3 - Td or Tdap) 11/17/2026   Pneumonia Vaccine 89 Years old  Completed   INFLUENZA VACCINE  Completed   HPV VACCINES  Aged Out    Health Maintenance  Health Maintenance Due  Topic Date Due   Zoster Vaccines- Shingrix (1 of 2) Never done   COVID-19 Vaccine (5 - 2023-24 season) 09/08/2021   Medicare Annual Wellness  (AWV)  03/10/2022    Colorectal cancer screening: No longer required.   Lung Cancer Screening: (Low Dose CT Chest recommended if Age 87-80years, 30 pack-year currently smoking OR have quit w/in 15years.) does not qualify.   Additional Screening:  Hepatitis C Screening: does not qualify  Vision Screening: Recommended annual ophthalmology exams for early detection of glaucoma and other disorders of the eye. Is the patient up to date with their annual eye exam?  Yes  Who is the provider or what is the name of the office in which the patient attends annual eye exams? Doesn't recall name If pt is not established with a provider, would they like to be referred to a provider to establish care? No .   Dental Screening: Recommended annual dental exams for proper oral hygiene  Community Resource Referral / Chronic Care Management: CRR required this visit?  No   CCM required this visit?  No      Plan:     I have personally reviewed and noted the following in the patient's chart:   Medical and social history Use of alcohol, tobacco or illicit drugs  Current medications and supplements  including opioid prescriptions. Patient is not currently taking opioid prescriptions. Functional ability and status Nutritional status Physical activity Advanced directives List of other physicians Hospitalizations, surgeries, and ER visits in previous 12 months Vitals Screenings to include cognitive, depression, and falls Referrals and appointments  In addition, I have reviewed and discussed with patient certain preventive protocols, quality metrics, and best practice recommendations. A written personalized care plan for preventive services as well as general preventive health recommendations were provided to patient.   Due to this being a telephonic visit, the after visit summary with patients personalized plan was offered to patient via mail or my-chart. Patient would like to access on  my-chart.  Beatris Ship, [place]   03/20/2022   Nurse Notes: None

## 2022-03-20 NOTE — Patient Instructions (Signed)
Luke [name] , Thank you for taking time to come for your Medicare Wellness Visit. I appreciate your ongoing commitment to your health goals. Please review the following plan we discussed and let me know if I can assist you in the future.   These are the goals we discussed:  Goals      Patient Stated     Would like to lose more weight & continue playing golf        This is a list of the screening recommended for you and due dates:  Health Maintenance  Topic Date Due   Zoster (Shingles) Vaccine (1 of 2) Never done   COVID-19 Vaccine (5 - 2023-24 season) 09/08/2021   Medicare Annual Wellness Visit  03/20/2023   DTaP/Tdap/Td vaccine (3 - Td or Tdap) 11/17/2026   Pneumonia Vaccine  Completed   Flu Shot  Completed   HPV Vaccine  Aged Out     Next appointment: Follow up in one year for your annual wellness visit.   Preventive Care 87 Years and Older, Male Preventive care refers to lifestyle choices and visits with your health care provider that can promote health and wellness. What does preventive care include? A yearly physical exam. This is also called an annual well check. Dental exams once or twice a year. Routine eye exams. Ask your health care provider how often you should have your eyes checked. Personal lifestyle choices, including: Daily care of your teeth and gums. Regular physical activity. Eating a healthy diet. Avoiding tobacco and drug use. Limiting alcohol use. Practicing safe sex. Taking low doses of aspirin every day. Taking vitamin and mineral supplements as recommended by your health care provider. What happens during an annual well check? The services and screenings done by your health care provider during your annual well check will depend on your age, overall health, lifestyle risk factors, and family history of disease. Counseling  Your health care provider may ask you questions about your: Alcohol use. Tobacco use. Drug use. Emotional  well-being. Home and relationship well-being. Sexual activity. Eating habits. History of falls. Memory and ability to understand (cognition). Work and work Statistician. Screening  You may have the following tests or measurements: Height, weight, and BMI. Blood pressure. Lipid and cholesterol levels. These may be checked every 5 years, or more frequently if you are over 69 years old. Skin check. Lung cancer screening. You may have this screening every year starting at age 38 if you have a 30-pack-year history of smoking and currently smoke or have quit within the past 15 years. Fecal occult blood test (FOBT) of the stool. You may have this test every year starting at age 20. Flexible sigmoidoscopy or colonoscopy. You may have a sigmoidoscopy every 5 years or a colonoscopy every 10 years starting at age 74. Prostate cancer screening. Recommendations will vary depending on your family history and other risks. Hepatitis C blood test. Hepatitis B blood test. Sexually transmitted disease (STD) testing. Diabetes screening. This is done by checking your blood sugar (glucose) after you have not eaten for a while (fasting). You may have this done every 1-3 years. Abdominal aortic aneurysm (AAA) screening. You may need this if you are a current or former smoker. Osteoporosis. You may be screened starting at age 61 if you are at high risk. Talk with your health care provider about your test results, treatment options, and if necessary, the need for more tests. Vaccines  Your health care provider may recommend certain vaccines, such as:  Influenza vaccine. This is recommended every year. Tetanus, diphtheria, and acellular pertussis (Tdap, Td) vaccine. You may need a Td booster every 10 years. Zoster vaccine. You may need this after age 2. Pneumococcal 13-valent conjugate (PCV13) vaccine. One dose is recommended after age 55. Pneumococcal polysaccharide (PPSV23) vaccine. One dose is recommended after  age 24. Talk to your health care provider about which screenings and vaccines you need and how often you need them. This information is not intended to replace advice given to you by your health care provider. Make sure you discuss any questions you have with your health care provider. Document Released: 01/21/2015 Document Revised: 09/14/2015 Document Reviewed: 10/26/2014 Elsevier Interactive Patient Education  2017 [place] Prevention in the Home Falls can cause injuries. They can happen to people of all ages. There are many things you can do to make your home safe and to help prevent falls. What can I do on the outside of my home? Regularly fix the edges of walkways and driveways and fix any cracks. Remove anything that might make you trip as you walk through a door, such as a raised step or threshold. Trim any bushes or trees on the path to your home. Use bright outdoor lighting. Clear any walking paths of anything that might make someone trip, such as rocks or tools. Regularly check to see if handrails are loose or broken. Make sure that both sides of any steps have handrails. Any raised decks and porches should have guardrails on the edges. Have any leaves, snow, or ice cleared regularly. Use sand or salt on walking paths during winter. Clean up any spills in your garage right away. This includes oil or grease spills. What can I do in the bathroom? Use night lights. Install grab bars by the toilet and in the tub and shower. Do not use towel bars as grab bars. Use non-skid mats or decals in the tub or shower. If you need to sit down in the shower, use a plastic, non-slip stool. Keep the floor dry. Clean up any water that spills on the floor as soon as it happens. Remove soap buildup in the tub or shower regularly. Attach bath mats securely with double-sided non-slip rug tape. Do not have throw rugs and other things on the floor that can make you trip. What can I do in the  bedroom? Use night lights. Make sure that you have a light by your bed that is easy to reach. Do not use any sheets or blankets that are too big for your bed. They should not hang down onto the floor. Have a firm chair that has side arms. You can use this for support while you get dressed. Do not have throw rugs and other things on the floor that can make you trip. What can I do in the kitchen? Clean up any spills right away. Avoid walking on wet floors. Keep items that you use a lot in easy-to-reach places. If you need to reach something above you, use a strong step stool that has a grab bar. Keep electrical cords out of the way. Do not use floor polish or wax that makes floors slippery. If you must use wax, use non-skid floor wax. Do not have throw rugs and other things on the floor that can make you trip. What can I do with my stairs? Do not leave any items on the stairs. Make sure that there are handrails on both sides of the stairs and use them.  Fix handrails that are broken or loose. Make sure that handrails are as long as the stairways. Check any carpeting to make sure that it is firmly attached to the stairs. Fix any carpet that is loose or worn. Avoid having throw rugs at the top or bottom of the stairs. If you do have throw rugs, attach them to the floor with carpet tape. Make sure that you have a light switch at the top of the stairs and the bottom of the stairs. If you do not have them, ask someone to add them for you. What else can I do to help prevent falls? Wear shoes that: Do not have high heels. Have rubber bottoms. Are comfortable and fit you well. Are closed at the toe. Do not wear sandals. If you use a stepladder: Make sure that it is fully opened. Do not climb a closed stepladder. Make sure that both sides of the stepladder are locked into place. Ask someone to hold it for you, if possible. Clearly mark and make sure that you can see: Any grab bars or  handrails. First and last steps. Where the edge of each step is. Use tools that help you move around (mobility aids) if they are needed. These include: Canes. Walkers. Scooters. Crutches. Turn on the lights when you go into a dark area. Replace any light bulbs as soon as they burn out. Set up your furniture so you have a clear path. Avoid moving your furniture around. If any of your floors are uneven, fix them. If there are any pets around you, be aware of where they are. Review your medicines with your doctor. Some medicines can make you feel dizzy. This can increase your chance of falling. Ask your doctor what other things that you can do to help prevent falls. This information is not intended to replace advice given to you by your health care provider. Make sure you discuss any questions you have with your health care provider. Document Released: 10/21/2008 Document Revised: 06/02/2015 Document Reviewed: 01/29/2014 Elsevier Interactive Patient Education  2017 [employer].

## 2022-03-21 ENCOUNTER — Ambulatory Visit: Payer: Medicare Other | Attending: Cardiology | Admitting: Cardiology

## 2022-03-21 ENCOUNTER — Other Ambulatory Visit (HOSPITAL_BASED_OUTPATIENT_CLINIC_OR_DEPARTMENT_OTHER): Payer: Self-pay

## 2022-03-21 ENCOUNTER — Encounter: Payer: Self-pay | Admitting: Cardiology

## 2022-03-21 VITALS — BP 126/60 | HR 60 | Ht 69.0 in | Wt 206.0 lb

## 2022-03-21 DIAGNOSIS — Z951 Presence of aortocoronary bypass graft: Secondary | ICD-10-CM

## 2022-03-21 DIAGNOSIS — I480 Paroxysmal atrial fibrillation: Secondary | ICD-10-CM | POA: Diagnosis not present

## 2022-03-21 DIAGNOSIS — I10 Essential (primary) hypertension: Secondary | ICD-10-CM | POA: Insufficient documentation

## 2022-03-21 DIAGNOSIS — I2510 Atherosclerotic heart disease of native coronary artery without angina pectoris: Secondary | ICD-10-CM

## 2022-03-21 DIAGNOSIS — E782 Mixed hyperlipidemia: Secondary | ICD-10-CM | POA: Diagnosis not present

## 2022-03-21 MED ORDER — AMLODIPINE BESYLATE 5 MG PO TABS: 5.0000 mg | ORAL_TABLET | Freq: Every day | ORAL | 3 refills | Status: DC

## 2022-03-21 NOTE — Patient Instructions (Signed)
Medication Instructions:   DECREASE: Amlodipine to '5mg'$  daily- you may cut your current dose in half and your next refill will reflect your new dose   Lab Work: Lipid, AST, ALT- today If you have labs (blood work) drawn today and your tests are completely normal, you will receive your results only by: [place] (if you have MyChart) OR A paper copy in the mail If you have any lab test that is abnormal or we need to change your treatment, we will call you to review the results.   Testing/Procedures: None Ordered   Follow-Up: At [facility], you and your health needs are our priority.  As part of our continuing mission to provide you with exceptional heart care, we have created designated Provider Care Teams.  These Care Teams include your primary Cardiologist (physician) and Advanced Practice Providers (APPs -  Physician Assistants and Nurse Practitioners) who all work together to provide you with the care you need, when you need it.  We recommend signing up for the patient portal called "MyChart".  Sign up information is provided on this After Visit Summary.  MyChart is used to connect with patients for Virtual Visits (Telemedicine).  Patients are able to view lab/test results, encounter notes, upcoming appointments, etc.  Non-urgent messages can be sent to your provider as well.   To learn more about what you can do with MyChart, go to [email or web address].    Your next appointment:   6 month(s)  The format for your next appointment:   In Person  Provider:   Jenne Campus, MD    Other Instructions NA

## 2022-03-21 NOTE — Progress Notes (Signed)
Cardiology Office Note:    Date:  03/21/2022   ID:  Luke [name], DOB 08-21-1930, MRN [id number]  PCP:  Shelda Pal, DO  Cardiologist:  Jenne Campus, MD    Referring MD: Shelda Pal*   No chief complaint on file. I am getting old  History of Present Illness:    Luke [name] is a 89 y.o. male   with past medical history significant for coronary artery disease status post coronary bypass graft done more than 15 years ago.  Last cardiac catheterization performed in 2019 showed all patent grafts.  Also history of DVT as well as paroxysmal atrial fibrillation on Eliquis, dyslipidemia with intolerance to higher dosages of statin.  Comes today to months for follow-up cardiac wise doing well.  Complaint he have is the fact that when he gets up quickly he gets dizzy.  Denies have any chest pain tightness squeezing pressure burning chest.  He said he is getting all because he is not able to do as much as he was able to do.  He is also frustrated about the medication the way he is getting at some medication for 90 days some medication for 30 days.  He started exercising on the regular basis now  Past Medical History:  Diagnosis Date   Abnormal EKG    Atrial fibrillation ([place])    ATRIAL FIBRILLATION, HX OF 04/16/2008   Qualifier: Diagnosis of  By: Sidney Ace     CAD (coronary artery disease)     5 with the left internal mamary to the left anterior  descending coronary artery.   Chest pain 07/18/2016   Coronary artery disease involving native coronary artery of native heart with unstable angina pectoris ([place])    DEEP VENOUS THROMBOPHLEBITIS, HX OF 04/16/2008   Qualifier: Diagnosis of  By: Sidney Ace     DVT (deep venous thrombosis) ([place]) 02/06/2019   DVT femoral (deep venous thrombosis) with thrombophlebitis ([place])    Dyslipidemia    Dyslipidemia, goal LDL below 70 04/16/2008   Qualifier: Diagnosis of  By: Sidney Ace     HTN (hypertension)    Hx of CABG     Hyperlipidemia 01/30/2012   Hypertension 04/16/2008   Qualifier: Diagnosis of  By: Sidney Ace     Open wound of right hand with tendon involvement 11/18/2016   Osteoarthritis 01/30/2012   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 S/p Phaco/IOL OS with Alcon SN60WF 19.0D, wound zero, on 02/11/2012   Unstable angina [facility])     Past Surgical History:  Procedure Laterality Date   CORONARY ARTERY BYPASS GRAFT  2001   I & D EXTREMITY Right 11/18/2016   Procedure: IRRIGATION AND DEBRIDEMENT RIGHT LONG AND RING FINGER;  Surgeon: Netta Cedars, MD;  Location: [facility];  Service: Orthopedics;  Laterality: Right;   LEFT HEART CATH AND CORS/GRAFTS ANGIOGRAPHY N/A 07/19/2016   Procedure: Left Heart Cath and Cors/Grafts Angiography;  Surgeon: Jettie Booze, MD;  Location: [facility];  Service: Cardiovascular;  Laterality: N/A;    Current Medications: Current Meds  Medication Sig   amLODipine (NORVASC) 10 MG tablet TAKE 1 TABLET BY MOUTH EVERY DAY   apixaban (ELIQUIS) 5 MG TABS tablet Take 1 tablet (5 mg total) by mouth 2 (two) times daily.   aspirin 81 MG tablet Take 81 mg by mouth daily.   atorvastatin (LIPITOR) 20 MG tablet Take 20 mg by mouth 3 (three)  times a week.   benazepril (LOTENSIN) 40 MG tablet Take 1 tablet (40 mg total) by mouth daily.   Coenzyme Q10 (COQ10) 200 MG CAPS Take 200 mg by mouth daily.   ezetimibe (ZETIA) 10 MG tablet Take 1 tablet (10 mg total) by mouth daily.   glucosamine-chondroitin 500-400 MG tablet Take 1 tablet by mouth daily.    Krill Oil 500 MG CAPS Take 500 mg by mouth daily.   metoprolol tartrate (LOPRESSOR) 25 MG tablet Take 1 tablet (25 mg total) by mouth 2 (two) times daily.   Multiple Vitamins-Minerals (CENTRUM SILVER PO) Take 1 tablet by mouth daily. Unknown strength   OMEGA-3 FATTY ACIDS PO Take 1,200 mg by mouth 2 (two) times daily.     Allergies:   Statins   Social History    Socioeconomic History   Marital status: Widowed    Spouse name: Not on file   Number of children: Not on file   Years of education: Not on file   Highest education level: Not on file  Occupational History   Not on file  Tobacco Use   Smoking status: Former   Smokeless tobacco: Never   Tobacco comments:    stopped in the 1950's  Vaping Use   Vaping Use: Never used  Substance and Sexual Activity   Alcohol use: Yes    Alcohol/week: 0.0 standard drinks of alcohol    Comment: occ   Drug use: No   Sexual activity: Not on file  Other Topics Concern   Not on file  Social History Narrative   He lives in [place] with his wife.  He does not smoke.He drives about two hours a day which increases his risk for DVT.  He is retired.  He is a Psychologist, occupational at [employer].          Social Determinants of Health   Financial Resource Strain: Medium Risk (03/09/2021)   Overall Financial Resource Strain (CARDIA)    Difficulty of Paying Living Expenses: Somewhat hard  Food Insecurity: No Food Insecurity (03/20/2022)   Hunger Vital Sign    Worried About Running Out of Food in the Last Year: Never true    Ran Out of Food in the Last Year: Never true  Transportation Needs: No Transportation Needs (03/20/2022)   PRAPARE - Hydrologist (Medical): No    Lack of Transportation (Non-Medical): No  Physical Activity: Insufficiently Active (03/09/2021)   Exercise Vital Sign    Days of Exercise per Week: 3 days    Minutes of Exercise per Session: 30 min  Stress: No Stress Concern Present (03/09/2021)   [place]    Feeling of Stress : Not at all  Social Connections: Socially Isolated (03/09/2021)   Social Connection and Isolation Panel [NHANES]    Frequency of Communication with Friends and Family: Three times a week    Frequency of Social Gatherings with Friends and Family: Three times a week     Attends Religious Services: Never    Active Member of Clubs or Organizations: No    Attends Archivist Meetings: Never    Marital Status: Widowed     Family History: The patient's family history includes Cancer in his mother; Coronary artery disease in an other family member; Heart attack in his father; Heart disease in his father and mother. ROS:   Please see the history of present illness.    All 14  point review of systems negative except as described per history of present illness  EKGs/Labs/Other Studies Reviewed:      Recent Labs: 12/12/2021: BUN 22; Creatinine, Ser 1.26; Hemoglobin 13.5; Platelets 159; Potassium 4.6; Sodium 138  Recent Lipid Panel    Component Value Date/Time   CHOL 161 09/23/2019 0841   TRIG 178 (H) 09/23/2019 0841   TRIG 89 11/15/2005 0820   HDL 34 (L) 09/23/2019 0841   CHOLHDL 4.7 09/23/2019 0841   CHOLHDL 4.2 02/28/2015 1541   VLDL 23 02/28/2015 1541   LDLCALC 96 09/23/2019 0841   LDLDIRECT 81 01/27/2021 1106   LDLDIRECT 139.9 06/18/2006 1008    Physical Exam:    VS:  BP 126/60 (BP Location: Left Arm, Patient Position: Sitting)   Pulse 60   Ht '5\' 9"'$  (1.753 m)   Wt 206 lb (93.4 kg)   SpO2 97%   BMI 30.42 kg/m     Wt Readings from Last 3 Encounters:  03/21/22 206 lb (93.4 kg)  12/12/21 199 lb (90.3 kg)  07/19/21 208 lb (94.3 kg)     GEN:  Well nourished, well developed in no acute distress HEENT: Normal NECK: No JVD; No carotid bruits LYMPHATICS: No lymphadenopathy CARDIAC: RRR, no murmurs, no rubs, no gallops RESPIRATORY:  Clear to auscultation without rales, wheezing or rhonchi  ABDOMEN: Soft, non-tender, non-distended MUSCULOSKELETAL:  No edema; No deformity  SKIN: Warm and dry LOWER EXTREMITIES: no swelling NEUROLOGIC:  Alert and oriented x 3 PSYCHIATRIC:  Normal affect   ASSESSMENT:    1. Paroxysmal atrial fibrillation (HCC)   2. Coronary artery disease involving native coronary artery of native heart without  angina pectoris   3. Primary hypertension   4. Mixed hyperlipidemia   5. Hx of CABG    PLAN:    In order of problems listed above:  Paroxysmal atrial fibrillation: Stable from that point review we will continue anticoagulation. Coronary artery disease stable denies have any chest pain tightness squeezing pressure burning chest. Essential hypertension blood pressure well-controlled actually I will ask him to cut down his amlodipine to only half a tablet daily to prevent orthostatic hypotension that look like he is experiencing. History of CABG stable. Dyslipidemia I did review K PN I see last numbers from January 2023.  Will do fasting lipid profile will continue with Lipitor 20   Medication Adjustments/Labs and Tests Ordered: Current medicines are reviewed at length with the patient today.  Concerns regarding medicines are outlined above.  No orders of the defined types were placed in this encounter.  Medication changes: No orders of the defined types were placed in this encounter.   Signed, Park Liter, MD, [facility] 03/21/2022 1:20 PM    [place]

## 2022-03-22 LAB — LIPID PANEL
Chol/HDL Ratio: 3.6 ratio (ref 0.0–5.0)
Cholesterol, Total: 149 mg/dL (ref 100–199)
HDL: 41 mg/dL (ref >39)
LDL Chol Calc (NIH): 87 mg/dL (ref 0–99)
Triglycerides: 118 mg/dL (ref 0–149)
VLDL Cholesterol Cal: 21 mg/dL (ref 5–40)

## 2022-03-22 LAB — AST: AST: 17 IU/L (ref 0–40)

## 2022-03-22 LAB — ALT: ALT: 13 IU/L (ref 0–44)

## 2022-04-12 ENCOUNTER — Telehealth: Payer: Self-pay

## 2022-04-12 NOTE — Telephone Encounter (Signed)
Sent a message to HIM pool for medical records request per patient.

## 2022-04-18 ENCOUNTER — Other Ambulatory Visit (HOSPITAL_BASED_OUTPATIENT_CLINIC_OR_DEPARTMENT_OTHER): Payer: Self-pay

## 2022-05-03 ENCOUNTER — Telehealth: Payer: Self-pay

## 2022-05-03 DIAGNOSIS — I2510 Atherosclerotic heart disease of native coronary artery without angina pectoris: Secondary | ICD-10-CM

## 2022-05-03 DIAGNOSIS — I10 Essential (primary) hypertension: Secondary | ICD-10-CM

## 2022-05-03 DIAGNOSIS — E782 Mixed hyperlipidemia: Secondary | ICD-10-CM

## 2022-05-03 NOTE — Telephone Encounter (Signed)
Patient notified of the following, agreed with plan. I explained the refill request process per his request, he verbalized understanding.

## 2022-05-03 NOTE — Telephone Encounter (Signed)
-----   Message from Georgeanna Lea, MD sent at 05/02/2022  8:46 PM EDT ----- That will be a good choice lets refer to lipid clinic ----- Message ----- From: Heywood Bene, CMA Sent: 04/11/2022  11:45 AM EDT To: Georgeanna Lea, MD  Patient states he is already on Zetia, lipid clinic perhaps? ----- Message ----- From: Georgeanna Lea, MD Sent: 04/10/2022   9:07 AM EDT To: Tiburcio Pea Shakir Petrosino, CMA  Lets start Zetia 10 mg daily, fasting lipid profile, AST LT 6 weeks ----- Message ----- From: Heywood Bene, CMA Sent: 03/27/2022  10:46 AM EDT To: Georgeanna Lea, MD  Spoke with patient, he says he cannot tolerate statin drugs due to muscle cramps. Any other recommendations? ----- Message ----- From: Georgeanna Lea, MD Sent: 03/22/2022   9:24 PM EDT To: Neena Rhymes, RN  Cholesterol not well-controlled double the dose of Lipitor to 40 mg daily, fasting lipid profile, AST LT 6 weeks

## 2022-05-17 ENCOUNTER — Other Ambulatory Visit: Payer: Self-pay

## 2022-05-17 ENCOUNTER — Other Ambulatory Visit (HOSPITAL_BASED_OUTPATIENT_CLINIC_OR_DEPARTMENT_OTHER): Payer: Self-pay

## 2022-05-17 DIAGNOSIS — I82402 Acute embolism and thrombosis of unspecified deep veins of left lower extremity: Secondary | ICD-10-CM

## 2022-05-17 MED ORDER — AMLODIPINE BESYLATE 5 MG PO TABS: 5.0000 mg | ORAL_TABLET | Freq: Every day | ORAL | 3 refills | Status: DC

## 2022-05-17 MED ORDER — EZETIMIBE 10 MG PO TABS: 10.0000 mg | ORAL_TABLET | Freq: Every day | ORAL | 3 refills | Status: DC

## 2022-05-17 MED ORDER — METOPROLOL TARTRATE 25 MG PO TABS: 25.0000 mg | ORAL_TABLET | Freq: Two times a day (BID) | ORAL | 3 refills | Status: DC

## 2022-05-17 MED ORDER — ATORVASTATIN CALCIUM 20 MG PO TABS: 20.0000 mg | ORAL_TABLET | Freq: Every day | ORAL | 3 refills | Status: DC

## 2022-05-17 MED ORDER — APIXABAN 5 MG PO TABS: 5.0000 mg | ORAL_TABLET | Freq: Two times a day (BID) | ORAL | 3 refills | Status: DC

## 2022-06-14 ENCOUNTER — Other Ambulatory Visit (HOSPITAL_COMMUNITY): Payer: Self-pay

## 2022-06-14 ENCOUNTER — Ambulatory Visit: Payer: Medicare Other | Attending: Cardiology | Admitting: Pharmacist

## 2022-06-14 ENCOUNTER — Telehealth: Payer: Self-pay

## 2022-06-14 ENCOUNTER — Encounter: Payer: Self-pay | Admitting: Pharmacist

## 2022-06-14 ENCOUNTER — Telehealth: Payer: Self-pay | Admitting: Pharmacist

## 2022-06-14 DIAGNOSIS — I2510 Atherosclerotic heart disease of native coronary artery without angina pectoris: Secondary | ICD-10-CM | POA: Diagnosis not present

## 2022-06-14 DIAGNOSIS — I25110 Atherosclerotic heart disease of native coronary artery with unstable angina pectoris: Secondary | ICD-10-CM

## 2022-06-14 DIAGNOSIS — E785 Hyperlipidemia, unspecified: Secondary | ICD-10-CM

## 2022-06-14 NOTE — Telephone Encounter (Signed)
Pharmacy Patient Advocate Encounter   Received notification from [facility] that prior authorization for PRALUENT is required/requested.   PA submitted to [facility] via CoverMyMeds Key or (Medicaid) confirmation # [id number] Status is pending

## 2022-06-14 NOTE — Telephone Encounter (Signed)
Please complete PA for Repatha 

## 2022-06-14 NOTE — Patient Instructions (Addendum)
It was nice meeting you today  We would like your LDL (bad cholesterol) to be less than 70  You can reduce your atorvastatin to 10mg  three times a week and discontinue the Zetia  I will complete the prior authorization for Repatha which you will inject once every 2 weeks  I will contact you when it is approved  I will also contact Dr Bing Matter to see if switching to Xarelto is an option  Once you start the Repatha we will recheck your cholesterol levels in about 2-3 months  Laural Golden, PharmD, BCACP, CDCES, CPP [address], [address] [place], [place], [address] Phone: [phone], Fax: [phone]

## 2022-06-14 NOTE — Progress Notes (Signed)
Patient ID: Luke [name]                 DOB: [birth year]                    MRN: [id number]     HPI: Luke [name] is a 89 y.o. male patient referred to lipid clinic by Dr Bing Matter. PMH is significant for CAD, HTN, A fib and history of DVT. Intolerant to high doses of statins.  Patient presents today in good spirits although is unsure of hwy he is being seen. Brought medication list and pharmacy print outs which do not match EHR and he is confused. [facility] when [facility] was supposed to be on 2.5mg  daily. Previously was on atorvastatin 10mg  three times a week which was changed to atorvastatin 20mg  daily. He decided to increase atorvastatin to 10mg  daily due to fear of cramping. So far he reports tolerating medication well. Continues to golf regularly.   Is concerned regarding his pharmacy not allowing him to have 90 day supplies of medications and charging different prices. Believes Xarelto would be more affordable for him than Eliquis. Currently lives alone on fixed income.   Current Medications:  Atorvastatin 10mg  daily Ezetimibe 10mg  daily Omega 3 fatty acids   Intolerances: Atorvastatin doses >10mg   Risk Factors:  CAD Angina Hx of CABG  LDL goal: <70  Labs: TC 149, Trigs 118, HDL 41, LDL 87 (03/21/22)  Past Medical History:  Diagnosis Date   Abnormal EKG    Atrial fibrillation (HCC)    ATRIAL FIBRILLATION, HX OF 04/16/2008   Qualifier: Diagnosis of  By: Vikki Ports     CAD (coronary artery disease)     5 with the left internal mamary to the left anterior  descending coronary artery.   Chest pain 07/18/2016   Coronary artery disease involving native coronary artery of native heart with unstable angina pectoris (HCC)    DEEP VENOUS THROMBOPHLEBITIS, HX OF 04/16/2008   Qualifier: Diagnosis of  By: Vikki Ports     DVT (deep venous thrombosis) (HCC) 02/06/2019   DVT femoral (deep venous thrombosis) with thrombophlebitis (HCC)     Dyslipidemia    Dyslipidemia, goal LDL below 70 04/16/2008   Qualifier: Diagnosis of  By: Vikki Ports     HTN (hypertension)    Hx of CABG    Hyperlipidemia 01/30/2012   Hypertension 04/16/2008   Qualifier: Diagnosis of  By: Vikki Ports     Open wound of right hand with tendon involvement 11/18/2016   Osteoarthritis 01/30/2012   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 S/p Phaco/IOL OS with Alcon SN60WF 19.0D, wound zero, on 02/11/2012   Unstable angina (HCC)     Current Outpatient Medications on File Prior to Visit  Medication Sig Dispense Refill   amLODipine (NORVASC) 5 MG tablet Take 1 tablet (5 mg total) by mouth daily. 90 tablet 3   apixaban (ELIQUIS) 5 MG TABS tablet Take 1 tablet (5 mg total) by mouth 2 (two) times daily. 180 tablet 3   aspirin 81 MG tablet Take 81 mg by mouth daily.     atorvastatin (LIPITOR) 20 MG tablet Take 1 tablet (20 mg total) by mouth daily. 90 tablet 3   benazepril (LOTENSIN) 40 MG tablet Take 1 tablet (40 mg total) by mouth daily. 90 tablet 3   Coenzyme Q10 (COQ10) 200 MG CAPS Take 200 mg  by mouth daily.     ezetimibe (ZETIA) 10 MG tablet Take 1 tablet (10 mg total) by mouth daily. 90 tablet 3   glucosamine-chondroitin 500-400 MG tablet Take 1 tablet by mouth daily.      Krill Oil 500 MG CAPS Take 500 mg by mouth daily.     metoprolol tartrate (LOPRESSOR) 25 MG tablet Take 1 tablet (25 mg total) by mouth 2 (two) times daily. 180 tablet 3   Multiple Vitamins-Minerals (CENTRUM SILVER PO) Take 1 tablet by mouth daily. Unknown strength     OMEGA-3 FATTY ACIDS PO Take 1,200 mg by mouth 2 (two) times daily.     No current facility-administered medications on file prior to visit.    Allergies  Allergen Reactions   Statins Other (See Comments)    Muscles Cramping    Assessment/Plan:  1. Hyperlipidemia - Patient recent LDL 87 which is above goal of <70. Intolerant to higher doses of statins.  Recommend starting PCSK9i.   Using demo pen, educated patient on mechanism of action, storage, site selection, administration and possible adverse effects. Patient voiced understanding. Will complete PA and contact patient when approved. Recheck lipid panel in 2-3 months. Advised he could likely stop Zetia once he starts Repatha.  Advised that using a [facility] outpatient pharmacy would be ideal for him to reduce medication errors. Patient agreeable. Will also message Dr Bing Matter if switching from Eliquis to Xarelto is acceptable. Patient appreciative.   Laural Golden, PharmD, BCACP, CDCES, CPP [address], [address] [place], [place], [address] Phone: [phone], Fax: [phone]

## 2022-06-15 MED ORDER — PRALUENT 75 MG/ML ~~LOC~~ SOAJ: 75.0000 mg | SUBCUTANEOUS | 5 refills | Status: DC

## 2022-06-15 NOTE — Telephone Encounter (Signed)
Pharmacy Patient Advocate Encounter  Prior Authorization for PRALUENT 75MG   has been APPROVED by [facility] from 6.7.24 to 12.31.2099.

## 2022-06-15 NOTE — Telephone Encounter (Signed)
Contacted patient and let him know of Praluent approval. [facility] and placed orders for lipid panel

## 2022-06-17 ENCOUNTER — Encounter: Payer: Self-pay | Admitting: Pharmacist

## 2022-06-28 ENCOUNTER — Telehealth: Payer: Self-pay | Admitting: Pharmacist

## 2022-06-28 NOTE — Telephone Encounter (Signed)
Called and spoke with patient and advised that Dr Bing Matter was ok with him switching to Xarelto if cost was an issue. Patient appreciative and will finish his current supply of Eliquis and then decide. Also wanted [place] to know that he had preivously been having myalgias with atorvastatin 20mg  but those pains have since stopped and is tolerating 20mg  fine. Will update med list.

## 2022-09-06 ENCOUNTER — Other Ambulatory Visit: Payer: Self-pay | Admitting: Cardiology

## 2022-10-01 ENCOUNTER — Other Ambulatory Visit (HOSPITAL_BASED_OUTPATIENT_CLINIC_OR_DEPARTMENT_OTHER): Payer: Self-pay

## 2022-10-11 ENCOUNTER — Other Ambulatory Visit (HOSPITAL_BASED_OUTPATIENT_CLINIC_OR_DEPARTMENT_OTHER): Payer: Self-pay

## 2022-10-11 DIAGNOSIS — Z23 Encounter for immunization: Secondary | ICD-10-CM | POA: Diagnosis not present

## 2022-10-11 MED ORDER — INFLUENZA VAC A&B SURF ANT ADJ 0.5 ML IM SUSY
0.5000 mL | PREFILLED_SYRINGE | Freq: Once | INTRAMUSCULAR | 0 refills | Status: AC
  Filled 2022-10-11: qty 0.5, 1d supply, fill #0

## 2022-10-11 MED ORDER — COVID-19 MRNA VAC-TRIS(PFIZER) 30 MCG/0.3ML IM SUSY
0.3000 mL | PREFILLED_SYRINGE | Freq: Once | INTRAMUSCULAR | 0 refills | Status: AC
  Filled 2022-10-11: qty 0.3, 1d supply, fill #0

## 2022-11-19 ENCOUNTER — Other Ambulatory Visit: Payer: Self-pay

## 2022-11-19 ENCOUNTER — Other Ambulatory Visit (HOSPITAL_BASED_OUTPATIENT_CLINIC_OR_DEPARTMENT_OTHER): Payer: Self-pay

## 2022-11-19 DIAGNOSIS — I25110 Atherosclerotic heart disease of native coronary artery with unstable angina pectoris: Secondary | ICD-10-CM

## 2022-11-19 DIAGNOSIS — E785 Hyperlipidemia, unspecified: Secondary | ICD-10-CM

## 2022-11-19 MED ORDER — AREXVY 120 MCG/0.5ML IM SUSR
0.5000 mL | Freq: Once | INTRAMUSCULAR | 0 refills | Status: AC
  Filled 2022-11-19: qty 0.5, 1d supply, fill #0

## 2022-11-20 LAB — LIPID PANEL
Chol/HDL Ratio: 2 ratio (ref 0.0–5.0)
Cholesterol, Total: 90 mg/dL — ABNORMAL LOW (ref 100–199)
HDL: 44 mg/dL (ref >39)
LDL Chol Calc (NIH): 23 mg/dL (ref 0–99)
Triglycerides: 130 mg/dL (ref 0–149)
VLDL Cholesterol Cal: 23 mg/dL (ref 5–40)

## 2022-11-22 ENCOUNTER — Ambulatory Visit: Payer: Medicare Other | Attending: Cardiology | Admitting: Cardiology

## 2022-11-22 ENCOUNTER — Encounter: Payer: Self-pay | Admitting: Cardiology

## 2022-11-22 VITALS — BP 124/60 | HR 61 | Ht 69.0 in | Wt 209.0 lb

## 2022-11-22 DIAGNOSIS — I10 Essential (primary) hypertension: Secondary | ICD-10-CM

## 2022-11-22 DIAGNOSIS — I2510 Atherosclerotic heart disease of native coronary artery without angina pectoris: Secondary | ICD-10-CM | POA: Diagnosis not present

## 2022-11-22 DIAGNOSIS — E782 Mixed hyperlipidemia: Secondary | ICD-10-CM | POA: Diagnosis present

## 2022-11-22 DIAGNOSIS — R799 Abnormal finding of blood chemistry, unspecified: Secondary | ICD-10-CM

## 2022-11-22 DIAGNOSIS — I480 Paroxysmal atrial fibrillation: Secondary | ICD-10-CM

## 2022-11-22 DIAGNOSIS — R5383 Other fatigue: Secondary | ICD-10-CM | POA: Diagnosis not present

## 2022-11-22 NOTE — Addendum Note (Signed)
Addended by: Baldo Ash D on: 11/22/2022 11:42 AM   Modules accepted: Orders

## 2022-11-22 NOTE — Patient Instructions (Addendum)
Medication Instructions:  Your physician recommends that you continue on your current medications as directed. Please refer to the Current Medication list given to you today.  *If you need a refill on your cardiac medications before your next appointment, please call your pharmacy*   Lab Work: 3rd Floor   [address]  Your physician recommends that you return for lab work in:  when lab open You need to have labs done when you are fasting.  You can come Monday through Friday 8:00 am to 11:30AM and 1:00 to 4:00. You do not need to make an appointment as the order has already been placed. The labs you are going to have done are  CMP, TSH, A1C    Testing/Procedures: None Ordered   Follow-Up: At [facility], you and your health needs are our priority.  As part of our continuing mission to provide you with exceptional heart care, we have created designated Provider Care Teams.  These Care Teams include your primary Cardiologist (physician) and Advanced Practice Providers (APPs -  Physician Assistants and Nurse Practitioners) who all work together to provide you with the care you need, when you need it.  We recommend signing up for the patient portal called "MyChart".  Sign up information is provided on this After Visit Summary.  MyChart is used to connect with patients for Virtual Visits (Telemedicine).  Patients are able to view lab/test results, encounter notes, upcoming appointments, etc.  Non-urgent messages can be sent to your provider as well.   To learn more about what you can do with MyChart, go to [email or web address].    Your next appointment:   6 month(s)  The format for your next appointment:   In Person  Provider:   Gypsy Balsam, MD    Other Instructions NA

## 2022-11-22 NOTE — Progress Notes (Signed)
Cardiology Office Note:    Date:  11/22/2022   ID:  Luke [name], DOB 10-31-1930, MRN [id number]  PCP:  Sharlene Dory, DO  Cardiologist:  Gypsy Balsam, MD    Referring MD: Sharlene Dory*   Chief Complaint  Patient presents with   Medication Refill    All cardiac meds    History of Present Illness:    Luke [name] is a 89 y.o. male with past medical history significant for coronary disease, status post coronary bypass graft 115 years ago, last cardiac catheterization done in 2019 showing all grafts being patent, history of paroxysmal atrial fibrillation, DVT, on Eliquis, history of dyslipidemia intolerance to higher doses of statin now on PCSK9 agent. He comes today to months for follow-up overall cardiac wise doing fine.  He complained of having aches in all joints.  Still trying to be active and doing quite well.  Past Medical History:  Diagnosis Date   Abnormal EKG    Atrial fibrillation (HCC)    ATRIAL FIBRILLATION, HX OF 04/16/2008   Qualifier: Diagnosis of  By: Vikki Ports     CAD (coronary artery disease)     5 with the left internal mamary to the left anterior  descending coronary artery.   Chest pain 07/18/2016   Coronary artery disease involving native coronary artery of native heart with unstable angina pectoris (HCC)    DEEP VENOUS THROMBOPHLEBITIS, HX OF 04/16/2008   Qualifier: Diagnosis of  By: Vikki Ports     DVT (deep venous thrombosis) (HCC) 02/06/2019   DVT femoral (deep venous thrombosis) with thrombophlebitis (HCC)    Dyslipidemia    Dyslipidemia, goal LDL below 70 04/16/2008   Qualifier: Diagnosis of  By: Vikki Ports     HTN (hypertension)    Hx of CABG    Hyperlipidemia 01/30/2012   Hypertension 04/16/2008   Qualifier: Diagnosis of  By: Vikki Ports     Open wound of right hand with tendon involvement 11/18/2016   Osteoarthritis 01/30/2012   Pseudophakia of right eye 12/18/2011   Formatting of this note might be  different from the original. S/p Phaco/IOL OS with Alcon SN60WF 19.0D, wound zero, on 02/11/2012   Unstable angina [facility])     Past Surgical History:  Procedure Laterality Date   CORONARY ARTERY BYPASS GRAFT  2001   I & D EXTREMITY Right 11/18/2016   Procedure: IRRIGATION AND DEBRIDEMENT RIGHT LONG AND RING FINGER;  Surgeon: Beverely Low, MD;  Location: [facility] OR;  Service: Orthopedics;  Laterality: Right;   LEFT HEART CATH AND CORS/GRAFTS ANGIOGRAPHY N/A 07/19/2016   Procedure: Left Heart Cath and Cors/Grafts Angiography;  Surgeon: Corky Crafts, MD;  Location: [facility] INVASIVE CV LAB;  Service: Cardiovascular;  Laterality: N/A;    Current Medications: Current Meds  Medication Sig   Alirocumab (PRALUENT) 75 MG/ML SOAJ Inject 1 mL (75 mg total) into the skin every 14 (fourteen) days.   amLODipine (NORVASC) 5 MG tablet Take 1 tablet (5 mg total) by mouth daily. (Patient taking differently: Take 10 mg by mouth daily.)   apixaban (ELIQUIS) 5 MG TABS tablet Take 1 tablet (5 mg total) by mouth 2 (two) times daily.   aspirin 81 MG tablet Take 81 mg by mouth daily.   atorvastatin (LIPITOR) 20 MG tablet Take 20 mg by mouth 3 (three) times a week. Monday, Wednesday and Friday   benazepril (LOTENSIN) 40 MG tablet Take 1 tablet (40 mg total) by mouth daily.   Coenzyme Q10 (COQ10)  200 MG CAPS Take 100 mg by mouth daily.   ezetimibe (ZETIA) 10 MG tablet Take 1 tablet (10 mg total) by mouth daily.   KRILL OIL PO Take 500 mg by mouth daily. Krill + Omega3   metoprolol tartrate (LOPRESSOR) 25 MG tablet Take 1 tablet (25 mg total) by mouth 2 (two) times daily.   Multiple Vitamins-Minerals (CENTRUM SILVER PO) Take 1 tablet by mouth daily. Unknown strength   OMEGA-3 FATTY ACIDS PO Take 1,200 mg by mouth daily.     Allergies:   Statins   Social History   Socioeconomic History   Marital status: Widowed    Spouse name: Not on file   Number of children: Not on file   Years of education: Not on file    Highest education level: Not on file  Occupational History   Not on file  Tobacco Use   Smoking status: Former   Smokeless tobacco: Never   Tobacco comments:    stopped in the 1950's  Vaping Use   Vaping status: Never Used  Substance and Sexual Activity   Alcohol use: Yes    Alcohol/week: 0.0 standard drinks of alcohol    Comment: occ   Drug use: No   Sexual activity: Not on file  Other Topics Concern   Not on file  Social History Narrative   He lives in [place] with his wife.  He does not smoke.He drives about two hours a day which increases his risk for DVT.  He is retired.  He is a Agricultural consultant at [employer].          Social Determinants of Health   Financial Resource Strain: Medium Risk (03/09/2021)   Overall Financial Resource Strain (CARDIA)    Difficulty of Paying Living Expenses: Somewhat hard  Food Insecurity: No Food Insecurity (03/20/2022)   Hunger Vital Sign    Worried About Running Out of Food in the Last Year: Never true    Ran Out of Food in the Last Year: Never true  Transportation Needs: No Transportation Needs (03/20/2022)   PRAPARE - Administrator, Civil Service (Medical): No    Lack of Transportation (Non-Medical): No  Physical Activity: Insufficiently Active (03/09/2021)   Exercise Vital Sign    Days of Exercise per Week: 3 days    Minutes of Exercise per Session: 30 min  Stress: No Stress Concern Present (03/09/2021)   [employer] of Occupational Health - Occupational Stress Questionnaire    Feeling of Stress : Not at all  Social Connections: Socially Isolated (03/09/2021)   Social Connection and Isolation Panel [NHANES]    Frequency of Communication with Friends and Family: Three times a week    Frequency of Social Gatherings with Friends and Family: Three times a week    Attends Religious Services: Never    Active Member of Clubs or Organizations: No    Attends Banker Meetings: Never    Marital  Status: Widowed     Family History: The patient's family history includes Cancer in his mother; Coronary artery disease in an other family member; Heart attack in his father; Heart disease in his father and mother. ROS:   Please see the history of present illness.    All 14 point review of systems negative except as described per history of present illness  EKGs/Labs/Other Studies Reviewed:         Recent Labs: 12/12/2021: BUN 22; Creatinine, Ser 1.26; Hemoglobin 13.5; Platelets 159; Potassium 4.6;  Sodium 138 03/21/2022: ALT 13  Recent Lipid Panel    Component Value Date/Time   CHOL 90 (L) 11/19/2022 0954   TRIG 130 11/19/2022 0954   TRIG 89 11/15/2005 0820   HDL 44 11/19/2022 0954   CHOLHDL 2.0 11/19/2022 0954   CHOLHDL 4.2 02/28/2015 1541   VLDL 23 02/28/2015 1541   LDLCALC 23 11/19/2022 0954   LDLDIRECT 81 01/27/2021 1106   LDLDIRECT 139.9 06/18/2006 1008    Physical Exam:    VS:  BP 124/60 (BP Location: Left Arm, Patient Position: Sitting)   Pulse 61   Ht 5\' 9"  (1.753 m)   Wt 209 lb (94.8 kg)   SpO2 96%   BMI 30.86 kg/m     Wt Readings from Last 3 Encounters:  11/22/22 209 lb (94.8 kg)  03/21/22 206 lb (93.4 kg)  12/12/21 199 lb (90.3 kg)     GEN:  Well nourished, well developed in no acute distress HEENT: Normal NECK: No JVD; No carotid bruits LYMPHATICS: No lymphadenopathy CARDIAC: RRR, no murmurs, no rubs, no gallops RESPIRATORY:  Clear to auscultation without rales, wheezing or rhonchi  ABDOMEN: Soft, non-tender, non-distended MUSCULOSKELETAL:  No edema; No deformity  SKIN: Warm and dry LOWER EXTREMITIES: no swelling NEUROLOGIC:  Alert and oriented x 3 PSYCHIATRIC:  Normal affect   ASSESSMENT:    1. Coronary artery disease involving native coronary artery of native heart without angina pectoris   2. Paroxysmal atrial fibrillation (HCC)   3. Primary hypertension   4. Mixed hyperlipidemia    PLAN:    In order of problems listed  above:  Coronary disease stable from that point we will continue present management. Dyslipidemia I did review K PN he is LDL is 23 HDL 44.  Will continue present management. Paroxysmal atrial fibrillation.  Anticoagulant Eliquis which I will continue. Essential hypertension blood pressure well-controlled continue present management   Medication Adjustments/Labs and Tests Ordered: Current medicines are reviewed at length with the patient today.  Concerns regarding medicines are outlined above.  No orders of the defined types were placed in this encounter.  Medication changes: No orders of the defined types were placed in this encounter.   Signed, Georgeanna Lea, MD, [facility] 11/22/2022 11:27 AM    [facility]

## 2022-11-26 DIAGNOSIS — R5383 Other fatigue: Secondary | ICD-10-CM | POA: Diagnosis not present

## 2022-11-26 DIAGNOSIS — I2510 Atherosclerotic heart disease of native coronary artery without angina pectoris: Secondary | ICD-10-CM | POA: Diagnosis not present

## 2022-11-26 DIAGNOSIS — I10 Essential (primary) hypertension: Secondary | ICD-10-CM | POA: Diagnosis not present

## 2022-11-26 DIAGNOSIS — R799 Abnormal finding of blood chemistry, unspecified: Secondary | ICD-10-CM | POA: Diagnosis not present

## 2022-11-27 LAB — COMPREHENSIVE METABOLIC PANEL
ALT: 21 [IU]/L (ref 0–44)
AST: 24 [IU]/L (ref 0–40)
Albumin: 3.9 g/dL (ref 3.6–4.6)
Alkaline Phosphatase: 53 [IU]/L (ref 44–121)
BUN/Creatinine Ratio: 22 (ref 10–24)
BUN: 26 mg/dL (ref 10–36)
Bilirubin Total: <0.2 mg/dL (ref 0.0–1.2)
CO2: 19 mmol/L — ABNORMAL LOW (ref 20–29)
Calcium: 9 mg/dL (ref 8.6–10.2)
Chloride: 112 mmol/L — ABNORMAL HIGH (ref 96–106)
Creatinine, Ser: 1.18 mg/dL (ref 0.76–1.27)
Globulin, Total: 2.3 g/dL (ref 1.5–4.5)
Glucose: 88 mg/dL (ref 70–99)
Potassium: 4.9 mmol/L (ref 3.5–5.2)
Sodium: 146 mmol/L — ABNORMAL HIGH (ref 134–144)
Total Protein: 6.2 g/dL (ref 6.0–8.5)
eGFR: 59 mL/min/{1.73_m2} — ABNORMAL LOW (ref >59)

## 2022-11-27 LAB — TSH: TSH: 1.49 u[IU]/mL (ref 0.450–4.500)

## 2022-11-27 LAB — HEMOGLOBIN A1C
Est. average glucose Bld gHb Est-mCnc: 117 mg/dL
Hgb A1c MFr Bld: 5.7 % — ABNORMAL HIGH (ref 4.8–5.6)

## 2022-11-29 ENCOUNTER — Other Ambulatory Visit: Payer: Self-pay | Admitting: Cardiology

## 2022-11-29 ENCOUNTER — Telehealth: Payer: Self-pay

## 2022-11-29 ENCOUNTER — Telehealth: Payer: Self-pay | Admitting: Cardiology

## 2022-11-29 DIAGNOSIS — E785 Hyperlipidemia, unspecified: Secondary | ICD-10-CM

## 2022-11-29 DIAGNOSIS — I25110 Atherosclerotic heart disease of native coronary artery with unstable angina pectoris: Secondary | ICD-10-CM

## 2022-11-29 NOTE — Telephone Encounter (Signed)
Rx sent to pharmacy as requested.

## 2022-11-29 NOTE — Telephone Encounter (Signed)
-----   Message from Gypsy Balsam sent at 11/29/2022  9:23 AM EST ----- Labs are looking decent, continue present management

## 2022-11-29 NOTE — Telephone Encounter (Signed)
Patient notified through my chart.

## 2022-11-29 NOTE — Telephone Encounter (Signed)
*  STAT* If patient is at the pharmacy, call can be transferred to refill team.   1. Which medications need to be refilled? (please list name of each medication and dose if known) Praluent   2. Would you like to learn more about the convenience, safety, & potential cost savings by using the [facility]?      3. Are you open to using the [facility] (Type [facility].    4. Which pharmacy/location (including street and city if local pharmacy) is medication to be sent to? [facility] [address], [place]   5. Do they need a 30 day or 90 day supply? Need this today- out of medicine

## 2023-01-29 ENCOUNTER — Telehealth: Payer: Self-pay | Admitting: Pharmacist

## 2023-01-29 NOTE — Telephone Encounter (Signed)
Patient called and left message on machine regarding Praluent. Reports his copay has increased however it is due to deductible. Asked for me to review meds. Last LDL 23 in November 2024. Patient can d/c Zetia at this time.

## 2023-04-01 ENCOUNTER — Other Ambulatory Visit: Payer: Self-pay

## 2023-04-01 MED ORDER — BENAZEPRIL HCL 40 MG PO TABS: 40.0000 mg | ORAL_TABLET | Freq: Every day | ORAL | 1 refills | Status: DC

## 2023-04-20 ENCOUNTER — Other Ambulatory Visit (HOSPITAL_COMMUNITY): Payer: Self-pay

## 2023-04-20 ENCOUNTER — Emergency Department (HOSPITAL_BASED_OUTPATIENT_CLINIC_OR_DEPARTMENT_OTHER)

## 2023-04-20 ENCOUNTER — Encounter (HOSPITAL_BASED_OUTPATIENT_CLINIC_OR_DEPARTMENT_OTHER): Payer: Self-pay | Admitting: Emergency Medicine

## 2023-04-20 ENCOUNTER — Emergency Department (HOSPITAL_BASED_OUTPATIENT_CLINIC_OR_DEPARTMENT_OTHER)
Admission: EM | Admit: 2023-04-20 | Discharge: 2023-04-20 | Disposition: A | Discharge status: Home / Self Care | Attending: Emergency Medicine | Admitting: Emergency Medicine

## 2023-04-20 ENCOUNTER — Other Ambulatory Visit: Payer: Self-pay

## 2023-04-20 DIAGNOSIS — Z7982 Long term (current) use of aspirin: Secondary | ICD-10-CM | POA: Insufficient documentation

## 2023-04-20 DIAGNOSIS — M7731 Calcaneal spur, right foot: Secondary | ICD-10-CM | POA: Diagnosis not present

## 2023-04-20 DIAGNOSIS — I82401 Acute embolism and thrombosis of unspecified deep veins of right lower extremity: Secondary | ICD-10-CM | POA: Insufficient documentation

## 2023-04-20 DIAGNOSIS — M19071 Primary osteoarthritis, right ankle and foot: Secondary | ICD-10-CM | POA: Diagnosis not present

## 2023-04-20 DIAGNOSIS — Z7901 Long term (current) use of anticoagulants: Secondary | ICD-10-CM | POA: Diagnosis not present

## 2023-04-20 DIAGNOSIS — I82441 Acute embolism and thrombosis of right tibial vein: Secondary | ICD-10-CM | POA: Diagnosis not present

## 2023-04-20 DIAGNOSIS — M79604 Pain in right leg: Secondary | ICD-10-CM | POA: Diagnosis present

## 2023-04-20 DIAGNOSIS — M79661 Pain in right lower leg: Secondary | ICD-10-CM | POA: Diagnosis not present

## 2023-04-20 DIAGNOSIS — I824Y1 Acute embolism and thrombosis of unspecified deep veins of right proximal lower extremity: Secondary | ICD-10-CM | POA: Diagnosis not present

## 2023-04-20 DIAGNOSIS — I824Z1 Acute embolism and thrombosis of unspecified deep veins of right distal lower extremity: Secondary | ICD-10-CM | POA: Diagnosis not present

## 2023-04-20 DIAGNOSIS — M79671 Pain in right foot: Secondary | ICD-10-CM | POA: Diagnosis not present

## 2023-04-20 LAB — CBC WITH DIFFERENTIAL/PLATELET
Abs Immature Granulocytes: 0.05 10*3/uL (ref 0.00–0.07)
Basophils Absolute: 0.1 10*3/uL (ref 0.0–0.1)
Basophils Relative: 1 %
Eosinophils Absolute: 0.2 10*3/uL (ref 0.0–0.5)
Eosinophils Relative: 2 %
HCT: 38.9 % — ABNORMAL LOW (ref 39.0–52.0)
Hemoglobin: 13.3 g/dL (ref 13.0–17.0)
Immature Granulocytes: 1 %
Lymphocytes Relative: 15 %
Lymphs Abs: 1.5 10*3/uL (ref 0.7–4.0)
MCH: 33.3 pg (ref 26.0–34.0)
MCHC: 34.2 g/dL (ref 30.0–36.0)
MCV: 97.5 fL (ref 80.0–100.0)
Monocytes Absolute: 0.8 10*3/uL (ref 0.1–1.0)
Monocytes Relative: 8 %
Neutro Abs: 7.4 10*3/uL (ref 1.7–7.7)
Neutrophils Relative %: 73 %
Platelets: 157 10*3/uL (ref 150–400)
RBC: 3.99 MIL/uL — ABNORMAL LOW (ref 4.22–5.81)
RDW: 12.5 % (ref 11.5–15.5)
WBC: 9.9 10*3/uL (ref 4.0–10.5)
nRBC: 0 % (ref 0.0–0.2)

## 2023-04-20 LAB — BASIC METABOLIC PANEL WITH GFR
Anion gap: 10 (ref 5–15)
BUN: 25 mg/dL — ABNORMAL HIGH (ref 8–23)
CO2: 23 mmol/L (ref 22–32)
Calcium: 8.8 mg/dL — ABNORMAL LOW (ref 8.9–10.3)
Chloride: 108 mmol/L (ref 98–111)
Creatinine, Ser: 1.27 mg/dL — ABNORMAL HIGH (ref 0.61–1.24)
GFR, Estimated: 54 mL/min — ABNORMAL LOW (ref >60)
Glucose, Bld: 103 mg/dL — ABNORMAL HIGH (ref 70–99)
Potassium: 4.4 mmol/L (ref 3.5–5.1)
Sodium: 141 mmol/L (ref 135–145)

## 2023-04-20 MED ORDER — RIVAROXABAN 15 MG PO TABS: 15.0000 mg | ORAL_TABLET | Freq: Two times a day (BID) | ORAL | 0 refills | Status: DC

## 2023-04-20 MED ORDER — RIVAROXABAN (XARELTO) VTE STARTER PACK (15 & 20 MG)
ORAL_TABLET | ORAL | 0 refills | Status: DC
Start: 2023-04-20 — End: 2023-04-20

## 2023-04-20 MED ORDER — RIVAROXABAN (XARELTO) VTE STARTER PACK (15 & 20 MG)
ORAL_TABLET | ORAL | 0 refills | Status: DC
  Filled 2023-04-20: qty 51, 30d supply, fill #0

## 2023-04-20 NOTE — Discharge Instructions (Addendum)
 Discontinue your Eliquis and start Xarelto as prescribed.  Take your first dose at dinnertime tonight of Xarelto.  You are going to take 15 mg twice a day for 21 days and then take 20 mg daily.  [facility].  You will take your first dose tonight at dinnertime.  Take 1 dose in the morning tomorrow and 1 dose in the evening and then you can come back to our pharmacy here at [facility] on Monday to get your free starter pack.  Follow-up with your primary care doctor, cardiology, DVT clinic.  You should be getting a phone call from the DVT clinic for follow-up.  Keep the same precautions as you do with Eliquis.  If you have any trauma especially to your head you should be evaluated.

## 2023-04-20 NOTE — ED Provider Notes (Addendum)
 [place] [facility] AT [facility] Provider Note   CSN: [id number] Arrival date & time: 04/20/23  1151     History  Chief Complaint  Patient presents with   Leg Pain    Luke [name] is a 89 y.o. male.  Patient here with pain to his right foot started yesterday.  He has a history of clot in the left leg which he is currently on Eliquis.  He felt like when he had the clot in his left leg it felt similar to how it does in his right foot.  He denies any weakness numbness tingling.  Denies any fevers or chills or trauma.  Denies any infectious symptoms.  No shortness of breath chest pain.  The history is provided by the patient.       Home Medications Prior to Admission medications   Medication Sig Start Date End Date Taking? Authorizing Provider  Rivaroxaban (XARELTO) 15 MG TABS tablet Take 1 tablet (15 mg total) by mouth 2 (two) times daily with a meal for 3 doses. 04/20/23 04/22/23 Yes Gaila Engebretsen, DO  Alirocumab (PRALUENT) 75 MG/ML SOAJ INJECT 1 ML (75 MG TOTAL) INTO THE SKIN EVERY 14 (FOURTEEN) DAYS. 11/29/22   Krasowski, Robert J, MD  amLODipine (NORVASC) 5 MG tablet Take 1 tablet (5 mg total) by mouth daily. Patient taking differently: Take 10 mg by mouth daily. 05/17/22   Krasowski, Robert J, MD  aspirin 81 MG tablet Take 81 mg by mouth daily.    [provider]  atorvastatin (LIPITOR) 20 MG tablet Take 20 mg by mouth 3 (three) times a week. Monday, Wednesday and Friday 06/28/22   Krasowski, Robert J, MD  benazepril (LOTENSIN) 40 MG tablet Take 1 tablet (40 mg total) by mouth daily. 04/01/23   Krasowski, Robert J, MD  Coenzyme Q10 (COQ10) 200 MG CAPS Take 100 mg by mouth daily.    [provider]  KRILL OIL PO Take 500 mg by mouth daily. Krill + Omega3    [provider]  metoprolol tartrate (LOPRESSOR) 25 MG tablet Take 1 tablet (25 mg total) by mouth 2 (two) times daily. 05/17/22   Krasowski, Robert J, MD  Multiple  Vitamins-Minerals (CENTRUM SILVER PO) Take 1 tablet by mouth daily. Unknown strength    [provider]  OMEGA-3 FATTY ACIDS PO Take 1,200 mg by mouth daily.    [provider]  RIVAROXABAN Leward Record) VTE STARTER PACK (15 & 20 MG) Follow package directions: Take one 15mg  tablet by mouth twice a day. On day 22, switch to one 20mg  tablet once a day. Take with food. 04/20/23   Emmamae Mcnamara, DO      Allergies    Statins    Review of Systems   Review of Systems  Physical Exam Updated Vital Signs BP (!) 129/56   Pulse 60   Temp 97.9 F (36.6 C)   Resp 18   Wt 95 kg   SpO2 97%   BMI 30.93 kg/m  Physical Exam Vitals and nursing note reviewed.  Constitutional:      General: He is not in acute distress.    Appearance: He is well-developed. He is not ill-appearing.  HENT:     Head: Normocephalic and atraumatic.     Nose: Nose normal.     Mouth/Throat:     Mouth: Mucous membranes are moist.  Eyes:     Extraocular Movements: Extraocular movements intact.     Conjunctiva/sclera: Conjunctivae normal.  Pupils: Pupils are equal, round, and reactive to light.  Cardiovascular:     Rate and Rhythm: Normal rate and regular rhythm.     Pulses: Normal pulses.     Heart sounds: No murmur heard. Pulmonary:     Effort: Pulmonary effort is normal. No respiratory distress.     Breath sounds: Normal breath sounds.  Abdominal:     General: Abdomen is flat.     Palpations: Abdomen is soft.     Tenderness: There is no abdominal tenderness.  Musculoskeletal:        General: Tenderness present. No swelling. Normal range of motion.     Cervical back: Normal range of motion and neck supple.     Comments: Tenderness to the bottom of his right foot with no major swelling or erythema  Skin:    General: Skin is warm and dry.     Capillary Refill: Capillary refill takes less than 2 seconds.  Neurological:     General: No focal deficit present.     Mental Status: He is alert.      Sensory: No sensory deficit.     Motor: No weakness.  Psychiatric:        Mood and Affect: Mood normal.     ED Results / Procedures / Treatments   Labs (all labs ordered are listed, but only abnormal results are displayed) Labs Reviewed  CBC WITH DIFFERENTIAL/PLATELET - Abnormal; Notable for the following components:      Result Value   RBC 3.99 (*)    HCT 38.9 (*)    All other components within normal limits  BASIC METABOLIC PANEL WITH GFR - Abnormal; Notable for the following components:   Glucose, Bld 103 (*)    BUN 25 (*)    Creatinine, Ser 1.27 (*)    Calcium 8.8 (*)    GFR, Estimated 54 (*)    All other components within normal limits    EKG None  Radiology US  Venous Img Lower Right (DVT Study) Result Date: 04/20/2023 CLINICAL DATA:  Right foot and calf pain for 2 days. Remote history of DVT. On Eliquis. EXAM: Right LOWER EXTREMITY VENOUS DOPPLER ULTRASOUND TECHNIQUE: Gray-scale sonography with graded compression, as well as color Doppler and duplex ultrasound were performed to evaluate the lower extremity deep venous systems from the level of the common femoral vein and including the common femoral, femoral, profunda femoral, popliteal and calf veins including the posterior tibial, peroneal and gastrocnemius veins when visible. The superficial great saphenous vein was also interrogated. Spectral Doppler was utilized to evaluate flow at rest and with distal augmentation maneuvers in the common femoral, femoral and popliteal veins. COMPARISON:  Ultrasound 01/30/2019 left leg.  Right 05/03/2018 FINDINGS: Contralateral Common Femoral Vein: Respiratory phasicity is normal and symmetric with the symptomatic side. No evidence of thrombus. Normal compressibility. Common Femoral Vein: No evidence of thrombus. Normal compressibility, respiratory phasicity and response to augmentation. Saphenofemoral Junction: No evidence of thrombus. Normal compressibility and flow on color Doppler  imaging. Profunda Femoral Vein: No evidence of thrombus. Normal compressibility and flow on color Doppler imaging. Femoral Vein: No evidence of thrombus. Normal compressibility, respiratory phasicity and response to augmentation. Popliteal Vein: No evidence of thrombus. Normal compressibility, respiratory phasicity and response to augmentation. Calf Veins: Incomplete compression of 1 of 2 paired posterior tibial veins with some poor flow, occlusive. Superficial Great Saphenous Vein: No evidence of thrombus. Normal compressibility. Venous Reflux:  None. Other Findings: Popliteal fossa fluid collection identified measuring 3.2 x  1.0 x 1.0 cm. No flow on Doppler. Baker's cyst likely. This is smaller than the prior examination. IMPRESSION: Occlusive thrombus along 1 of 2 posterior tibial veins in the calf. No above the knee DVT. Smaller popliteal fossa cyst compared to the previous examination. Electronically Signed   By: Adrianna Horde M.D.   On: 04/20/2023 13:52   DG Foot Complete Right Result Date: 04/20/2023 CLINICAL DATA:  Right foot aching EXAM: RIGHT FOOT COMPLETE - 3+ VIEW COMPARISON:  None Available. FINDINGS: Chronic degenerative changes are present in the form of joint space narrowing and osteophyte formation which are worse in the first digit. Midfoot degenerative changes are also present. Calcaneal spurring. Calcific atherosclerosis. IMPRESSION: 1. No evidence of acute fracture or malalignment. 2. Chronic degenerative changes. Electronically Signed   By: Reagan Camera M.D.   On: 04/20/2023 12:36    Procedures Procedures    Medications Ordered in ED Medications - No data to display  ED Course/ Medical Decision Making/ A&P                                 Medical Decision Making Amount and/or Complexity of Data Reviewed Labs: ordered. Radiology: ordered.  Risk Prescription drug management.   Luke [name] is here with right foot pain/right leg pain.  Normal vitals.  No fever.   Neurovascular neuromuscular intact on exam.  Strong pulses in bilateral lower extremities.  No infectious symptoms and no infectious signs on exam.  He is tender mostly to the bottom of his right foot.  However he states that this is how he felt when he had blood clot in his left leg.  He is on Eliquis.  Has not missed any doses.  Differential diagnosis could be arthritic process versus less likely DVT.  Does not sound like there is been any trauma.  Will check basic labs x-ray of the foot, DVT study.  Neurovascular neuromuscular is intact.  Have no concern for arterial process.  Overall lab works unremarkable.  I reviewed interpreted labs and imaging.  Ultrasound of the legs shows maybe occlusive thrombus along 1 of 2 posterior tibial veins in the calf.  No above-the-knee DVT.  Foot x-ray is unremarkable.  I talked with Dr. Rosalva Comber with vascular and ultimately he thought it be reasonable to discontinue Eliquis and start Xarelto at this time and have patient follow-up in DVT clinic.  The hope is that he will have a better response to Xarelto then he has with Eliquis.  Patient made aware of this.  He has been compliant with the Eliquis.  Ultimately he was educated about Xarelto and the plan and we will stop the Eliquis start Xarelto have him follow-up in DVT clinic and follow-up with his primary care doctor and cardiology.  Patient discharged in good condition.  Understands return precautions.  Patient can get free starter pack of Xarelto from our pharmacy.  Therefore [facility] [place] [facility] and then he will pick up starter pack on Monday.  This chart was dictated using voice recognition software.  Despite best efforts to proofread,  errors can occur which can change the documentation meaning.     Final Clinical Impression(s) / ED Diagnoses Final diagnoses:  Acute deep vein thrombosis (DVT) of proximal vein of right lower extremity (HCC)    Rx / DC  Orders ED Discharge Orders  Ordered    AMB Referral to Deep Vein Thrombosis Clinic        04/20/23 1413    RIVAROXABAN (XARELTO) VTE STARTER PACK (15 & 20 MG)  Status:  Discontinued        04/20/23 1419    RIVAROXABAN (XARELTO) VTE STARTER PACK (15 & 20 MG)        04/20/23 1443    Rivaroxaban (XARELTO) 15 MG TABS tablet  2 times daily with meals        04/20/23 1444              Luke Kijowski, DO 04/20/23 1433    Luke Stoffers, DO 04/20/23 1445

## 2023-04-20 NOTE — ED Triage Notes (Addendum)
 Pt presents for R foot aching that started yesterday. H/o DVT in 2021 (eliquis currently) which he states felt similar to the pain he has today. Denies fever, redness, tenderness, sensation change In triage exam, RLE is minimally edematous without erythema. Good sensation Bilateral pedal pulses 2 +  Denies new trauma

## 2023-04-22 ENCOUNTER — Other Ambulatory Visit (HOSPITAL_BASED_OUTPATIENT_CLINIC_OR_DEPARTMENT_OTHER): Payer: Self-pay

## 2023-04-22 ENCOUNTER — Ambulatory Visit: Payer: Self-pay

## 2023-04-22 ENCOUNTER — Telehealth: Payer: Self-pay

## 2023-04-22 ENCOUNTER — Telehealth: Payer: Self-pay | Admitting: Cardiology

## 2023-04-22 NOTE — Telephone Encounter (Signed)
 Pt son called in stating pt was seen in ED Saturday for DVT. He asked is there anything he needs to do moving forward from a cardiology standpoint. Please advise, notes from ED are in Epic.

## 2023-04-22 NOTE — Telephone Encounter (Signed)
 Pt was seen on 04/22/23 for DVT. Next hos F/U slot you have available is the one that the pt is scheduled for: 04/24/23. Son is here to help with appts. Please advise on if any other spots can be used.   ------------------------------------------------------------------------------------------------ Copied from CRM [id number]. Topic: Clinical - Medical Advice >> Apr 22, 2023  8:47 AM Allyne Areola wrote: Reason for CRM: Patient was seen in the ER on 04/20/2023 and was diagnosed with deep vein thrombosis, scheduled a hospital follow up for April 16; however, patient would like to know if there is any way to see him sooner since his son is here from out  of town to assist with taking him to his appointments, they also referred patient to a DVT clinic but he does now know who to contact.

## 2023-04-22 NOTE — Telephone Encounter (Signed)
  Chief Complaint: Pain, hospital follow up Symptoms: pain Frequency: constant Pertinent Negatives: Patient denies chest pain, difficulty breathing.  Disposition: [] ED /[] Urgent Care (no appt availability in office) / [x] Appointment(In office/virtual)/ []  [place] [facility]/ [] Home Care/ [] Refused Recommended Disposition /[] [facility]/ []  Follow-up with PCP Additional Notes:  Daughter Cozette Divine calling, this Clinical research associate obtained verbal permission from patient Luke [name] for [facility] to speak on his behalf. [name] went to the ER Saturday 04/20/23 for foot swelling and pain, he was diagnosed right foot DVT. Blood thinner was changed from eliquis to Xarelto. Daughter calling today to move hospital follow up appointment scheduled on 04/24/23 to today or tomorrow due to strong foot pain. No new symptoms. He has not tried any pain relievers due to new medication and unsure of interactions. Advised ok to take acetaminophen along with Xarelto. No available appointments with PCP prior to already scheduled appointment on 04/24/23. Plan to try OTC acetaminophen, rest, and elevation, if no relief with call back for further triage and recommendations. Educated on care advice as documented in protocol, patient verbalized understanding. Discussed reasons to call back or call for EMS.    Copied from CRM [id number]. Topic: Clinical - Red Word Triage >> Apr 22, 2023 12:56 PM [name] wrote: Red Word that prompted transfer to Nurse Triage: whole body in pain Reason for Disposition  [1] MODERATE pain (e.g., interferes with normal activities, limping) AND [2] present > 3 days  Protocols used: Leg Pain-A-AH

## 2023-04-22 NOTE — Telephone Encounter (Signed)
 Appointment made with Dr. Krasowski for ED follow up.

## 2023-04-22 NOTE — Telephone Encounter (Signed)
 Daughter is following up. She has concerns with patient being switched from Eliquis to Xarelto. She says he's in a lot of pain and she's very worried. She says she'll come to the office for recommendations if she has to, but he can't just sit there in pain. Please advise.

## 2023-04-23 ENCOUNTER — Encounter (HOSPITAL_COMMUNITY): Payer: Self-pay | Admitting: Student-PharmD

## 2023-04-23 ENCOUNTER — Ambulatory Visit (HOSPITAL_COMMUNITY)
Admission: RE | Admit: 2023-04-23 | Discharge: 2023-04-23 | Disposition: A | Discharge status: Home / Self Care | Source: Ambulatory Visit | Attending: Vascular Surgery | Admitting: Vascular Surgery

## 2023-04-23 VITALS — BP 107/49 | HR 68

## 2023-04-23 DIAGNOSIS — I82441 Acute embolism and thrombosis of right tibial vein: Secondary | ICD-10-CM | POA: Diagnosis not present

## 2023-04-23 NOTE — Patient Instructions (Signed)
-  Continue rivaroxaban (Xarelto) 15 mg twice daily with food for 21 days followed by 20 mg daily with food. -We are referring you to hematology.  -It is important to take your medication around the same time every day.  -Avoid NSAIDs like ibuprofen (Advil, Motrin) and naproxen (Aleve) as well as aspirin doses over 100 mg daily. -Tylenol (acetaminophen) is the preferred over the counter pain medication to lower the risk of bleeding. -Be sure to alert all of your health care providers that you are taking an anticoagulant prior to starting a new medication or having a procedure. -Monitor for signs and symptoms of bleeding (abnormal bruising, prolonged bleeding, nose bleeds, bleeding from gums, discolored urine, black tarry stools). If you have fallen and hit your head OR if your bleeding is severe or not stopping, seek emergency care.  -Go to the emergency room if emergent signs and symptoms of new clot occur (new or worse swelling and pain in an arm or leg, shortness of breath, chest pain, fast or irregular heartbeats, lightheadedness, dizziness, fainting, coughing up blood) or if you experience a significant color change (pale or blue) in the extremity that has the DVT.  -We recommend you wear compression stockings (15-20 mmHg) as long as you are having swelling or pain. Be sure to purchase the correct size and take them off at night.   **As of May 06, 2023, our location is changing** We will be located at [facility] at:  [address], [address], [place], [place] [address] Phone number as of 05/06/23 will be [phone].   If you have any questions or need to reschedule an appointment, please call [phone] [facility] (active number until 05/06/23).  If you are having an emergency, call 911 or present to the nearest emergency room.   What is a DVT?  -Deep vein thrombosis (DVT) is a condition in which a blood clot forms in a vein of the deep venous system which can occur  in the lower leg, thigh, pelvis, arm, or neck. This condition is serious and can be life-threatening if the clot travels to the arteries of the lungs and causing a blockage (pulmonary embolism, PE). A DVT can also damage veins in the leg, which can lead to long-term venous disease, leg pain, swelling, discoloration, and ulcers or sores (post-thrombotic syndrome).  -Treatment may include taking an anticoagulant medication to prevent more clots from forming and the current clot from growing, wearing compression stockings, and/or surgical procedures to remove or dissolve the clot.

## 2023-04-23 NOTE — Progress Notes (Signed)
 DVT Clinic Note  Name: Luke [name]     MRN: [id number]     DOB: [birth year]     Sex: male  PCP: Sharlene Dory, DO  Today's Visit: Visit Information: Initial Visit  Referred to DVT Clinic by: Emergency Department - Dr. Lockie Mola Referred to CPP by: Dr. Hetty Blend Reason for referral:  Chief Complaint  Patient presents with   Med Management - DVT   HISTORY OF PRESENT ILLNESS: Luke [name] is a very pleasant 89 y.o. male with PMH CAD, s/p CABG, atrial fibrillation, DVT, HTN, HLD, who presents after diagnosis of DVT for medication management. He began to have right foot and leg pain on 04/19/23, presented to the ED 04/20/23 and found to have occlusive DVT in 1 of 2 posterior tibial veins. 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 Anticoagulation also indicated for afib. ED provider spoke with Dr. Karin Lieu who recommended switching Eliquis to Xarelto. Patient referred to DVT Clinic for follow up and recommended to also follow up with cardiology and primary care.   Today patient arrives in a wheelchair accompanied by his daughter Victorino Dike. Reports that he has been elevating his legs for the past couple days and started to see some improvement in RLE swelling. Denies pain in his calf today, but endorses pain in his foot. Due to the pain and swelling he has not been able to put on his compression stockings yet. Denies abnormal bleeding or bruising. Denies missed doses of Xarelto. [facility] Patient reports a fall while working in the yard 1-2 months ago but no obvious injury to the leg. He is active playing golf and working around the house but has been slightly less active in the last few months. Reports he has always been very careful to ensure he never missed a dose of Eliquis previously. Denies SOB, chest pain.   Positive Thrombotic Risk Factors: Previous VTE, Older  Age Bleeding Risk Factors: Age >65 years, Anticoagulant therapy, Antiplatelet therapy  Negative Thrombotic Risk Factors: Recent surgery (within 3 months), Recent trauma (within 3 months), Recent admission to hospital with acute illness (within 3 months), Paralysis, paresis, or recent plaster cast immobilization of lower extremity, Central venous catheterization, Bed rest >72 hours within 3 months, Sedentary journey lasting >8 hours within 4 weeks, Pregnancy, Within 6 weeks postpartum, Recent cesarean section (within 3 months), Estrogen therapy, Testosterone therapy, Erythropoiesis-stimulating agent, Recent COVID diagnosis (within 3 months), Active cancer, Non-malignant, chronic inflammatory condition, Known thrombophilic condition, Smoking, Obesity  Rx Insurance Coverage: Medicare Rx Affordability: [facility]  Rx Assistance Provided: Free 30-day trial card [facility]  Past Medical History:  Diagnosis Date   Abnormal EKG    Atrial fibrillation (HCC)    ATRIAL FIBRILLATION, HX OF 04/16/2008   Qualifier: Diagnosis of  By: Vikki Ports     CAD (coronary artery disease)     5 with the left internal mamary to the left anterior  descending coronary artery.   Chest pain 07/18/2016   Coronary artery disease involving native coronary artery of native heart with unstable angina pectoris (HCC)    DEEP VENOUS THROMBOPHLEBITIS, HX OF 04/16/2008   Qualifier: Diagnosis of  By: Vikki Ports     DVT (deep venous thrombosis) (HCC) 02/06/2019   DVT femoral (deep venous thrombosis) with thrombophlebitis (HCC)    Dyslipidemia  Dyslipidemia, goal LDL below 70 04/16/2008   Qualifier: Diagnosis of  By: Vikki Ports     HTN (hypertension)    Hx of CABG    Hyperlipidemia 01/30/2012   Hypertension 04/16/2008   Qualifier: Diagnosis of  By: Vikki Ports     Open wound of right hand with tendon involvement 11/18/2016   Osteoarthritis 01/30/2012   Pseudophakia of right eye  12/18/2011   Formatting of this note might be different from the original. S/p Phaco/IOL OS with Alcon SN60WF 19.0D, wound zero, on 02/11/2012   Unstable angina [facility])     Past Surgical History:  Procedure Laterality Date   CORONARY ARTERY BYPASS GRAFT  2001   I & D EXTREMITY Right 11/18/2016   Procedure: IRRIGATION AND DEBRIDEMENT RIGHT LONG AND RING FINGER;  Surgeon: Beverely Low, MD;  Location: [facility] OR;  Service: Orthopedics;  Laterality: Right;   LEFT HEART CATH AND CORS/GRAFTS ANGIOGRAPHY N/A 07/19/2016   Procedure: Left Heart Cath and Cors/Grafts Angiography;  Surgeon: Corky Crafts, MD;  Location: [facility];  Service: Cardiovascular;  Laterality: N/A;    Social History   Socioeconomic History   Marital status: Widowed    Spouse name: Not on file   Number of children: Not on file   Years of education: Not on file   Highest education level: Not on file  Occupational History   Not on file  Tobacco Use   Smoking status: Former   Smokeless tobacco: Never   Tobacco comments:    stopped in the 1950's  Vaping Use   Vaping status: Never Used  Substance and Sexual Activity   Alcohol use: Yes    Alcohol/week: 0.0 standard drinks of alcohol    Comment: occ   Drug use: No   Sexual activity: Not on file  Other Topics Concern   Not on file  Social History Narrative   He lives in [place] with his wife.  He does not smoke.He drives about two hours a day which increases his risk for DVT.  He is retired.  He is a Agricultural consultant at [employer].          Social Drivers of Health   Financial Resource Strain: Medium Risk (03/09/2021)   Overall Financial Resource Strain (CARDIA)    Difficulty of Paying Living Expenses: Somewhat hard  Food Insecurity: No Food Insecurity (03/20/2022)   Hunger Vital Sign    Worried About Running Out of Food in the Last Year: Never true    Ran Out of Food in the Last Year: Never true  Transportation Needs: No Transportation  Needs (03/20/2022)   PRAPARE - Administrator, Civil Service (Medical): No    Lack of Transportation (Non-Medical): No  Physical Activity: Insufficiently Active (03/09/2021)   Exercise Vital Sign    Days of Exercise per Week: 3 days    Minutes of Exercise per Session: 30 min  Stress: No Stress Concern Present (03/09/2021)   [employer] of Occupational Health - Occupational Stress Questionnaire    Feeling of Stress : Not at all  Social Connections: Socially Isolated (03/09/2021)   Social Connection and Isolation Panel [NHANES]    Frequency of Communication with Friends and Family: Three times a week    Frequency of Social Gatherings with Friends and Family: Three times a week    Attends Religious Services: Never    Active Member of Clubs or Organizations: No    Attends Banker Meetings: Never  Marital Status: Widowed  Intimate Partner Violence: Not At Risk (03/20/2022)   Humiliation, Afraid, Rape, and Kick questionnaire    Fear of Current or Ex-Partner: No    Emotionally Abused: No    Physically Abused: No    Sexually Abused: No    Family History  Problem Relation Age of Onset   Heart disease Mother    Cancer Mother    Heart disease Father    Heart attack Father    Coronary artery disease Other     Allergies as of 04/23/2023 - Review Complete 04/23/2023  Allergen Reaction Noted   Statins Other (See Comments) 05/07/2019    Current Outpatient Medications on File Prior to Encounter  Medication Sig Dispense Refill   Alirocumab (PRALUENT) 75 MG/ML SOAJ INJECT 1 ML (75 MG TOTAL) INTO THE SKIN EVERY 14 (FOURTEEN) DAYS. 2 mL 5   amLODipine (NORVASC) 5 MG tablet Take 1 tablet (5 mg total) by mouth daily. (Patient taking differently: Take 10 mg by mouth daily.) 90 tablet 3   aspirin 81 MG tablet Take 81 mg by mouth daily.     atorvastatin (LIPITOR) 20 MG tablet Take 20 mg by mouth daily. 90 tablet 1   benazepril (LOTENSIN) 40 MG tablet Take 1 tablet (40 mg  total) by mouth daily. 90 tablet 1   Coenzyme Q10 (COQ10) 200 MG CAPS Take 100 mg by mouth daily.     ezetimibe (ZETIA) 10 MG tablet Take 10 mg by mouth daily.     KRILL OIL PO Take 500 mg by mouth daily. Krill + Omega3     metoprolol tartrate (LOPRESSOR) 25 MG tablet Take 1 tablet (25 mg total) by mouth 2 (two) times daily. 180 tablet 3   Multiple Vitamins-Minerals (CENTRUM SILVER PO) Take 1 tablet by mouth daily. Unknown strength     OMEGA-3 FATTY ACIDS PO Take 1,200 mg by mouth daily.     RIVAROXABAN (XARELTO) VTE STARTER PACK (15 & 20 MG) Follow package directions: Take one 15mg  tablet by mouth twice a day. On day 22, switch to one 20mg  tablet once a day. Take with food. 51 each 0   No current facility-administered medications on file prior to encounter.   REVIEW OF SYSTEMS:  Review of Systems  Respiratory:  Negative for shortness of breath.   Cardiovascular:  Positive for leg swelling. Negative for chest pain and palpitations.  Musculoskeletal:  Positive for myalgias.  Neurological:  Positive for tingling. Negative for dizziness.   PHYSICAL EXAMINATION:  Vitals:   04/23/23 1014  BP: (!) 107/49  Pulse: 68  SpO2: 95%   Physical Exam Vitals reviewed.  Cardiovascular:     Rate and Rhythm: Normal rate.  Pulmonary:     Effort: Pulmonary effort is normal.  Musculoskeletal:        General: Tenderness present.     Right lower leg: Edema present.     Left lower leg: No edema.  Skin:    Findings: No bruising or erythema.  Psychiatric:        Mood and Affect: Mood normal.        Behavior: Behavior normal.        Thought Content: Thought content normal.   Villalta Score for Post-Thrombotic Syndrome: Pain: Severe Cramps: Moderate Heaviness: Severe Paresthesia: Moderate Pruritus: Absent Pretibial Edema: Mild Skin Induration: Absent Hyperpigmentation: Absent Redness: Absent Venous Ectasia: Absent Pain on calf compression: Absent Villalta Preliminary Score: 11 Is venous  ulcer present?: No If venous ulcer is present and score  is <15, then 15 points total are assigned: Absent Villalta Total Score: 11  LABS:  CBC     Component Value Date/Time   WBC 9.9 04/20/2023 1204   RBC 3.99 (L) 04/20/2023 1204   HGB 13.3 04/20/2023 1204   HGB 13.4 11/04/2020 1438   HGB 13.4 05/04/2016 1005   HCT 38.9 (L) 04/20/2023 1204   HCT 39.4 05/04/2016 1005   PLT 157 04/20/2023 1204   PLT 166 11/04/2020 1438   PLT 175 05/04/2016 1005   MCV 97.5 04/20/2023 1204   MCV 96 05/04/2016 1005   MCH 33.3 04/20/2023 1204   MCHC 34.2 04/20/2023 1204   RDW 12.5 04/20/2023 1204   RDW 13.5 05/04/2016 1005   LYMPHSABS 1.5 04/20/2023 1204   MONOABS 0.8 04/20/2023 1204   EOSABS 0.2 04/20/2023 1204   BASOSABS 0.1 04/20/2023 1204    Hepatic Function      Component Value Date/Time   PROT 6.2 11/26/2022 0901   ALBUMIN 3.9 11/26/2022 0901   AST 24 11/26/2022 0901   AST 19 11/04/2020 1438   ALT 21 11/26/2022 0901   ALT 18 11/04/2020 1438   ALKPHOS 53 11/26/2022 0901   BILITOT <0.2 11/26/2022 0901   BILITOT 0.4 11/04/2020 1438   BILIDIR 0.1 02/17/2014 0737    Renal Function   Lab Results  Component Value Date   CREATININE 1.27 (H) 04/20/2023   CREATININE 1.18 11/26/2022   CREATININE 1.26 (H) 12/12/2021    Estimated Creatinine Clearance: 44 mL/min (A) (by C-G formula based on SCr of 1.27 mg/dL (H)).   [facility] Vascular Lab Studies:  04/20/23 DVT study IMPRESSION: Occlusive thrombus along 1 of 2 posterior tibial veins in the calf. No above the knee DVT.  ASSESSMENT: Location of DVT: Right distal vein Cause of DVT: unprovoked  Patient has a history of unprovoked DVT in the LLE (2021) and had been treated with Eliquis since his last DVT in 2021. He reported no missed doses of Eliquis when he began to have pain in the right foot and calf, found to have occlusive DVT in 1 of 2 posterior tibial veins in the right leg on 04/20/23 and was switched from Eliquis to Xarelto per vascular  surgeon Dr. Rosalva Comber' recommendation in the ED. He has previously been followed by [facility] hematology for unprovoked DVT, last seen in 2022. Will refer patient back to hematology given recurrent DVT while compliant to Eliquis. He needs lifelong anticoagulation. Counseled patient on Xarelto, particularly on the differences between it and Eliquis such as the importance of taking Xarelto with food. Patient and daughter prefer for refills to come from one of his other providers that he sees long-term, such as his cardiologist or PCP, and he has appointments with both before the starter pack would run out. Discussed importance of compression and elevation. All questions have been answered at this time.   PLAN: -Continue rivaroxaban (Xarelto) 15 mg twice daily with food for 21 days followed by 20 mg daily with food. -Expected duration of therapy: Indefinite. Therapy started on 04/20/23 (was on Eliquis since 01/2019 prior to that). -Patient educated on purpose, proper use and potential adverse effects of rivaroxaban (Xarelto). -Discussed importance of taking medication around the same time every day. -Advised patient of medications to avoid (NSAIDs, aspirin doses >100 mg daily). -Educated that Tylenol (acetaminophen) is the preferred analgesic to lower the risk of bleeding. -Advised patient to alert all providers of anticoagulation therapy prior to starting a new medication or having a procedure. -Emphasized  importance of monitoring for signs and symptoms of bleeding (abnormal bruising, prolonged bleeding, nose bleeds, bleeding from gums, discolored urine, black tarry stools). -Educated patient to present to the ED if emergent signs and symptoms of new thrombosis occur. -Counseled patient to wear compression stockings daily, removing at night. Elevate legs to help improve swelling as well.   Follow up: Referral to hematology placed. DVT Clinic available as needed.   Faye Hoops, PharmD, [place], CPP Deep  Vein Thrombosis Clinic Clinical Pharmacist Practitioner

## 2023-04-24 ENCOUNTER — Encounter: Payer: Self-pay | Admitting: Family Medicine

## 2023-04-24 ENCOUNTER — Ambulatory Visit: Admitting: Family Medicine

## 2023-04-24 VITALS — BP 112/70 | HR 71 | Temp 98.0°F | Resp 16 | Ht 69.0 in | Wt 209.0 lb

## 2023-04-24 DIAGNOSIS — Z79899 Other long term (current) drug therapy: Secondary | ICD-10-CM | POA: Diagnosis not present

## 2023-04-24 DIAGNOSIS — R2689 Other abnormalities of gait and mobility: Secondary | ICD-10-CM | POA: Diagnosis not present

## 2023-04-24 DIAGNOSIS — R5381 Other malaise: Secondary | ICD-10-CM

## 2023-04-24 DIAGNOSIS — L570 Actinic keratosis: Secondary | ICD-10-CM | POA: Insufficient documentation

## 2023-04-24 NOTE — Progress Notes (Signed)
 Chief Complaint  Patient presents with   Hospitalization Follow-up    Hospital Follow up     Subjective: Patient is a 89 y.o. male here for ER f/u. Here w his daughter.   Patient went to the ER on 04/20/2023 and was diagnosed with a right lower extremity DVT.  Interestingly, he was taking Eliquis 5 mg twice daily.  He was compliant and had no adverse effects.  He was changed to Xarelto after the ED doctor spoke with the on-call hematologist.  He has been compliant with his medication.  Xarelto is quite expensive.  He has an appointment with his hematologist next week.  Swelling is improved.  Still having some pain but that is also improved.  The patient is unsure why he had a DVT while on Eliquis.  He reports he was compliant with the medication and not missing any doses.  No recent travel, prolonged bedrest, trauma, or surgeries.  He is not nearly as active as he used to be.  His balance is a concern.  1 year ago, he was golfing multiple times per week and he is interested in getting back out there.  The patient has a scaly lesion on his ear.  He has some on his arms and right side of his face as well.  There is no pain, drainage, bleeding, or itching.  No new lotions, soaps, topicals, or detergents.  He has not tried thing at home so far.  Past Medical History:  Diagnosis Date   Abnormal EKG    Atrial fibrillation (HCC)    ATRIAL FIBRILLATION, HX OF 04/16/2008   Qualifier: Diagnosis of  By: Jaramillo, Luz     CAD (coronary artery disease)     5 with the left internal mamary to the left anterior  descending coronary artery.   Chest pain 07/18/2016   Coronary artery disease involving native coronary artery of native heart with unstable angina pectoris (HCC)    DEEP VENOUS THROMBOPHLEBITIS, HX OF 04/16/2008   Qualifier: Diagnosis of  By: Malon Seamen     DVT (deep venous thrombosis) (HCC) 02/06/2019   DVT femoral (deep venous thrombosis) with thrombophlebitis (HCC)    Dyslipidemia     Dyslipidemia, goal LDL below 70 04/16/2008   Qualifier: Diagnosis of  By: Jaramillo, Luz     HTN (hypertension)    Hx of CABG    Hyperlipidemia 01/30/2012   Hypertension 04/16/2008   Qualifier: Diagnosis of  By: Jaramillo, Luz     Open wound of right hand with tendon involvement 11/18/2016   Osteoarthritis 01/30/2012   Pseudophakia of right eye 12/18/2011   Formatting of this note might be different from the original. S/p Phaco/IOL OS with Alcon SN60WF 19.0D, wound zero, on 02/11/2012   Unstable angina (HCC)     Objective: BP 112/70 (BP Location: Left Arm, Patient Position: Sitting)   Pulse 71   Temp 98 F (36.7 C) (Oral)   Resp 16   Ht 5\' 9"  (1.753 m)   Wt 209 lb (94.8 kg)   SpO2 96%   BMI 30.86 kg/m  General: Awake, appears stated age Heart: RRR, no LE edema Lungs: CTAB, no rales, wheezes or rhonchi. No accessory muscle use Skin: Scaly patches and macules with pink base noted on both upper extremities and left ear, right side of face. Psych: Age appropriate judgment and insight, normal affect and mood  Procedure note: cryotherapy Verbal consent obtained 11 skin lesions treated Liquid nitrogen was applied via a thin spray creating  an ice ball with 1-2 mm corona surrounding the lesion The patient tolerated the procedure well There were no immediate complications noted  Assessment and Plan: Balance problem - Plan: Ambulatory referral to Home Health  Physical deconditioning - Plan: Ambulatory referral to Home Health  Polypharmacy  Actinic keratoses - Plan: PR DESTRUCTION PREMALIGNANT LESION 1ST, PR DESTRUCTION PREMALIGNANT LESION 2-14 EA, Ambulatory referral to Dermatology  1/2.  Refer to home health physical therapy.  I think this will help with both his quality of life and ability to do activities of daily living.  Hopefully more movement will decrease risk of future DVT. 3.  We had a discussion about all of his medications and certain supplements he may or may not need.  Will  defer to original prescriber for his prescription medications.  Recommend stopping fish oil for 2 weeks and seeing how he feels.  If no different, stop completely.  He will repeat this process with krill oil next.  Continue CoQ10 given history of cramping with statin use. 4. Refer to dermatology to be on the safe side.  Cryotherapy for the lesions as above. As I do not prescribe him any medication, I will see him as needed. The patient voiced understanding and agreement to the plan.  [name], DO 04/24/23  12:34 PM

## 2023-04-24 NOTE — Patient Instructions (Addendum)
 OK to take Tylenol 1000 mg (2 extra strength tabs) or 975 mg (3 regular strength tabs) every 6 hours as needed.  If you do not hear anything about your referral in the next 1-2 weeks, call our office and ask for an update.  I would stop the fish oil first for a couple weeks. If no difference noticed, drop it. Then drop the krill oil next.   Continue the CoQ10 and multivitamin for now.   Consider Voltaren gel as well.   Foods that may reduce pain: 1) Ginger 2) Blueberries 3) Salmon 4) Pumpkin seeds 5) Dark chocolate 6) Turmeric 7) Tart cherries 8) Virgin olive oil 9) Chili peppers 10) Mint 11) Krill oil  For the skin, I expect this to brown over and peel away over the next week or so. If anything new arises or it returns, please let me know.   Let us  know if you need anything.

## 2023-04-25 ENCOUNTER — Telehealth: Payer: Self-pay

## 2023-04-25 DIAGNOSIS — Z7982 Long term (current) use of aspirin: Secondary | ICD-10-CM | POA: Diagnosis not present

## 2023-04-25 DIAGNOSIS — Z7901 Long term (current) use of anticoagulants: Secondary | ICD-10-CM | POA: Diagnosis not present

## 2023-04-25 DIAGNOSIS — Z604 Social exclusion and rejection: Secondary | ICD-10-CM | POA: Diagnosis not present

## 2023-04-25 DIAGNOSIS — I824Y1 Acute embolism and thrombosis of unspecified deep veins of right proximal lower extremity: Secondary | ICD-10-CM | POA: Diagnosis not present

## 2023-04-25 NOTE — Telephone Encounter (Signed)
 Copied from CRM [id number]. Topic: Clinical - Home Health Verbal Orders >> Apr 25, 2023  3:33 PM Jethro Morrison wrote: Caller/Agency: Polly Brink with [facility] Callback Number: [phone] Service Requested: Physical Therapy Frequency: 1 x week for 8weeks Any new concerns about the patient? No

## 2023-04-25 NOTE — Telephone Encounter (Signed)
 Spoke w/ Arlys John- verbal orders given.

## 2023-04-26 ENCOUNTER — Other Ambulatory Visit: Payer: Self-pay | Admitting: Family

## 2023-04-26 DIAGNOSIS — D6859 Other primary thrombophilia: Secondary | ICD-10-CM

## 2023-04-26 DIAGNOSIS — I82402 Acute embolism and thrombosis of unspecified deep veins of left lower extremity: Secondary | ICD-10-CM

## 2023-04-29 ENCOUNTER — Telehealth: Payer: Self-pay | Admitting: Pharmacy Technician

## 2023-04-29 ENCOUNTER — Inpatient Hospital Stay (HOSPITAL_BASED_OUTPATIENT_CLINIC_OR_DEPARTMENT_OTHER): Admitting: Family

## 2023-04-29 ENCOUNTER — Inpatient Hospital Stay

## 2023-04-29 ENCOUNTER — Other Ambulatory Visit (HOSPITAL_BASED_OUTPATIENT_CLINIC_OR_DEPARTMENT_OTHER): Payer: Self-pay

## 2023-04-29 ENCOUNTER — Inpatient Hospital Stay: Attending: Hematology & Oncology

## 2023-04-29 VITALS — BP 117/55 | HR 86 | Temp 98.0°F | Resp 17 | Ht 69.0 in | Wt 203.0 lb

## 2023-04-29 DIAGNOSIS — Z7982 Long term (current) use of aspirin: Secondary | ICD-10-CM

## 2023-04-29 DIAGNOSIS — I82401 Acute embolism and thrombosis of unspecified deep veins of right lower extremity: Secondary | ICD-10-CM

## 2023-04-29 DIAGNOSIS — Z86718 Personal history of other venous thrombosis and embolism: Secondary | ICD-10-CM | POA: Insufficient documentation

## 2023-04-29 DIAGNOSIS — I82461 Acute embolism and thrombosis of right calf muscular vein: Secondary | ICD-10-CM

## 2023-04-29 DIAGNOSIS — R760 Raised antibody titer: Secondary | ICD-10-CM | POA: Insufficient documentation

## 2023-04-29 DIAGNOSIS — D6859 Other primary thrombophilia: Secondary | ICD-10-CM

## 2023-04-29 DIAGNOSIS — I82402 Acute embolism and thrombosis of unspecified deep veins of left lower extremity: Secondary | ICD-10-CM

## 2023-04-29 DIAGNOSIS — Z7901 Long term (current) use of anticoagulants: Secondary | ICD-10-CM | POA: Insufficient documentation

## 2023-04-29 LAB — CBC WITH DIFFERENTIAL (CANCER CENTER ONLY)
Abs Immature Granulocytes: 0.04 10*3/uL (ref 0.00–0.07)
Basophils Absolute: 0 10*3/uL (ref 0.0–0.1)
Basophils Relative: 1 %
Eosinophils Absolute: 0.2 10*3/uL (ref 0.0–0.5)
Eosinophils Relative: 3 %
HCT: 38.1 % — ABNORMAL LOW (ref 39.0–52.0)
Hemoglobin: 12.6 g/dL — ABNORMAL LOW (ref 13.0–17.0)
Immature Granulocytes: 1 %
Lymphocytes Relative: 18 %
Lymphs Abs: 1.2 10*3/uL (ref 0.7–4.0)
MCH: 32.7 pg (ref 26.0–34.0)
MCHC: 33.1 g/dL (ref 30.0–36.0)
MCV: 99 fL (ref 80.0–100.0)
Monocytes Absolute: 0.6 10*3/uL (ref 0.1–1.0)
Monocytes Relative: 9 %
Neutro Abs: 4.4 10*3/uL (ref 1.7–7.7)
Neutrophils Relative %: 68 %
Platelet Count: 258 10*3/uL (ref 150–400)
RBC: 3.85 MIL/uL — ABNORMAL LOW (ref 4.22–5.81)
RDW: 12.8 % (ref 11.5–15.5)
WBC Count: 6.4 10*3/uL (ref 4.0–10.5)
nRBC: 0 % (ref 0.0–0.2)

## 2023-04-29 LAB — CMP (CANCER CENTER ONLY)
ALT: 37 U/L (ref 0–44)
AST: 25 U/L (ref 15–41)
Albumin: 3.8 g/dL (ref 3.5–5.0)
Alkaline Phosphatase: 40 U/L (ref 38–126)
Anion gap: 11 (ref 5–15)
BUN: 27 mg/dL — ABNORMAL HIGH (ref 8–23)
CO2: 28 mmol/L (ref 22–32)
Calcium: 9.4 mg/dL (ref 8.9–10.3)
Chloride: 106 mmol/L (ref 98–111)
Creatinine: 1.21 mg/dL (ref 0.61–1.24)
GFR, Estimated: 57 mL/min — ABNORMAL LOW (ref >60)
Glucose, Bld: 98 mg/dL (ref 70–99)
Potassium: 4.8 mmol/L (ref 3.5–5.1)
Sodium: 145 mmol/L (ref 135–145)
Total Bilirubin: 0.2 mg/dL (ref 0.0–1.2)
Total Protein: 6.6 g/dL (ref 6.5–8.1)

## 2023-04-29 LAB — ANTITHROMBIN III: AntiThromb III Func: 114 % (ref 75–120)

## 2023-04-29 LAB — D-DIMER, QUANTITATIVE: D-Dimer, Quant: 1.4 ug{FEU}/mL — ABNORMAL HIGH (ref 0.00–0.50)

## 2023-04-29 MED ORDER — RIVAROXABAN 20 MG PO TABS
20.0000 mg | ORAL_TABLET | Freq: Every day | ORAL | 4 refills | Status: DC
  Filled 2023-04-29: qty 30, 30d supply, fill #0

## 2023-04-29 NOTE — Telephone Encounter (Signed)
 Attempted to contact patient regarding PAP assistance for Xarelto .  Unable to reach.  Left voice mail message.  Will follow-up with patient on 04/30/23.  [name] Patient Services Navigator [facility]

## 2023-04-29 NOTE — Progress Notes (Unsigned)
 Hematology/Oncology Consultation   Name: Luke [name]      MRN: [id number]    Location: Room/bed info not found  Date: 04/29/2023 Time:12:24 PM   REFERRING PHYSICIAN:    REASON FOR CONSULT:     DIAGNOSIS:  New RLE DVT dx while on Eliquis  - now on Xarelto  20 mg PO daily  History of acute LLE, unprovoked  -2016: hx of left lower extremity DVT, treated with warfarin x ~6 months  -01/2019: extensive occlusive and non-occlusive DVT from mid-femoral to popliteal and peroneal veins in the left lower extremity  On Eliquis  5mg  BID    HISTORY OF PRESENT ILLNESS:  Luke [name] is a very pleasant 89 yo gentleman with 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 He had been on Eliquis  5 mg PO BID and a baby aspirin  daily (past history of CABG). He now has a new RLE DVT that occurred while on Eliquis  and aspirin .  We last saw him in 2022.  He is currently on his Xarelto  starter pack. This cost him over $450 at [facility] so we are working on his coverage. Prescription moved to our pharmacy and cost dropped to $140. We are also looking into available financial assistance.  So far he has not had any blood loss. No abnormal bruising, no petechiae.  He is typically quite active and enjoys being out with his family and playing golf every week.  Since starting anticoagulation the pain and swelling in his right leg has improved. Pedal pulses are 1+.  D-dimer is elevated at 1.40.  No testosterone use.  He denies history of stroke.  No recent illness or injury.  No recent falls or syncope reported. His last fall was last fall while out in his yard using a leaf blower.  He does sit in the car for a couple hours at a time while his grandson is in sports practice.  No recent long travel days by car or plane.  No obvious dehydration.  No smoking, ETOH or recreational drug use.  He has history of atrial fib. He has generalized aches and pains especially in the knees with arthritis. He is doing PT at home for strengthening  and balance.  No diabetes or thyroid  disease.  No personal history of cancer.  No fever, chills, n/v, cough, rash, dizziness, SOB, chest pain, palpitations, abdominal pain or changes in bowel or bladder habits.  No numbness or tingling in his extremities at this time.  Appetite and hydration are good. Weight is stable at 203 lbs.   ROS: All other 10 point review of systems is negative.   PAST MEDICAL HISTORY:   Past Medical History:  Diagnosis Date   Abnormal EKG    Atrial fibrillation (HCC)    ATRIAL FIBRILLATION, HX OF 04/16/2008   Qualifier: Diagnosis of  By: Jaramillo, Luz     CAD (coronary artery disease)     5 with the left internal mamary to the left anterior  descending coronary artery.   Chest pain 07/18/2016   Coronary artery disease involving native coronary artery of native heart with unstable angina pectoris (HCC)    DEEP VENOUS THROMBOPHLEBITIS, HX OF 04/16/2008   Qualifier: Diagnosis of  By: Malon Seamen     DVT (deep venous thrombosis) (HCC) 02/06/2019   DVT femoral (deep venous thrombosis) with thrombophlebitis (HCC)    Dyslipidemia    Dyslipidemia, goal LDL below 70 04/16/2008   Qualifier: Diagnosis of  By: Jaramillo, Luz     HTN (hypertension)  Hx of CABG    Hyperlipidemia 01/30/2012   Hypertension 04/16/2008   Qualifier: Diagnosis of  By: Jaramillo, Luz     Open wound of right hand with tendon involvement 11/18/2016   Osteoarthritis 01/30/2012   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 S/p Phaco/IOL OS with Alcon SN60WF 19.0D, wound zero, on 02/11/2012   Unstable angina (HCC)     ALLERGIES: Allergies  Allergen Reactions   Statins Other (See Comments)    Muscles Cramping      MEDICATIONS:  Current Outpatient Medications on File Prior to Visit  Medication Sig Dispense Refill   Alirocumab  (PRALUENT ) 75 MG/ML SOAJ INJECT 1 ML (75 MG TOTAL) INTO THE SKIN EVERY 14 (FOURTEEN) DAYS. 2 mL 5   amLODipine   (NORVASC ) 5 MG tablet Take 1 tablet (5 mg total) by mouth daily. (Patient taking differently: Take 10 mg by mouth daily.) 90 tablet 3   aspirin  81 MG tablet Take 81 mg by mouth daily.     atorvastatin  (LIPITOR) 20 MG tablet Take 20 mg by mouth daily. 90 tablet 1   benazepril  (LOTENSIN ) 40 MG tablet Take 1 tablet (40 mg total) by mouth daily. 90 tablet 1   Coenzyme Q10 (COQ10) 200 MG CAPS Take 100 mg by mouth daily.     ezetimibe  (ZETIA ) 10 MG tablet Take 10 mg by mouth daily.     KRILL OIL PO Take 500 mg by mouth daily. Krill + Omega3     metoprolol  tartrate (LOPRESSOR ) 25 MG tablet Take 1 tablet (25 mg total) by mouth 2 (two) times daily. 180 tablet 3   Multiple Vitamins-Minerals (CENTRUM SILVER PO) Take 1 tablet by mouth daily. Unknown strength     RIVAROXABAN  (XARELTO ) VTE STARTER PACK (15 & 20 MG) Follow package directions: Take one 15mg  tablet by mouth twice a day. On day 22, switch to one 20mg  tablet once a day. Take with food. 51 each 0   No current facility-administered medications on file prior to visit.     PAST SURGICAL HISTORY Past Surgical History:  Procedure Laterality Date   CORONARY ARTERY BYPASS GRAFT  2001   I & D EXTREMITY Right 11/18/2016   Procedure: IRRIGATION AND DEBRIDEMENT RIGHT LONG AND RING FINGER;  Surgeon: Winston Hawking, MD;  Location: [facility] OR;  Service: Orthopedics;  Laterality: Right;   LEFT HEART CATH AND CORS/GRAFTS ANGIOGRAPHY N/A 07/19/2016   Procedure: Left Heart Cath and Cors/Grafts Angiography;  Surgeon: Lucendia Rusk, MD;  Location: [facility] INVASIVE CV LAB;  Service: Cardiovascular;  Laterality: N/A;    FAMILY HISTORY: Family History  Problem Relation Age of Onset   Heart disease Mother    Cancer Mother    Heart disease Father    Heart attack Father    Coronary artery disease Other     SOCIAL HISTORY:  reports that he has quit smoking. He has never used smokeless tobacco. He reports current alcohol use. He reports that he does not use  drugs.  PERFORMANCE STATUS: The patient's performance status is 1 - Symptomatic but completely ambulatory  PHYSICAL EXAM: Most Recent Vital Signs: Blood pressure (!) 117/55, pulse 86, temperature 98 F (36.7 C), temperature source Oral, resp. rate 17, height 5\' 9"  (1.753 m), weight 203 lb (92.1 kg), SpO2 96%. BP (!) 117/55 (BP Location: Right Arm, Patient Position: Sitting)   Pulse 86   Temp 98 F (36.7 C) (Oral)   Resp 17   Ht 5'  9" (1.753 m)   Wt 203 lb (92.1 kg)   SpO2 96%   BMI 29.98 kg/m   General Appearance:    Alert, cooperative, no distress, appears stated age  Head:    Normocephalic, without obvious abnormality, atraumatic  Eyes:    PERRL, conjunctiva/corneas clear, EOM's intact, fundi    benign, both eyes             Throat:   Lips, mucosa, and tongue normal; teeth and gums normal  Neck:   Supple, symmetrical, trachea midline, no adenopathy;       thyroid :  No enlargement/tenderness/nodules; no carotid   bruit or JVD  Back:     Symmetric, no curvature, ROM normal, no CVA tenderness  Lungs:     Clear to auscultation bilaterally, respirations unlabored  Chest wall:    No tenderness or deformity  Heart:    Regular rate and rhythm, S1 and S2 normal, no murmur, rub   or gallop  Abdomen:     Soft, non-tender, bowel sounds active all four quadrants,    no masses, no organomegaly        Extremities:   Extremities normal, atraumatic, no cyanosis or edema  Pulses:   2+ and symmetric all extremities  Skin:   Skin color, texture, turgor normal, no rashes or lesions  Lymph nodes:   Cervical, supraclavicular, and axillary nodes normal  Neurologic:   CNII-XII intact. Normal strength, sensation and reflexes      throughout    LABORATORY DATA:  Results for orders placed or performed in visit on 04/29/23 (from the past 48 hours)  CMP (Cancer Center only)     Status: Abnormal   Collection Time: 04/29/23 10:30 AM  Result Value Ref Range   Sodium 145 135 - 145 mmol/L    Potassium 4.8 3.5 - 5.1 mmol/L   Chloride 106 98 - 111 mmol/L   CO2 28 22 - 32 mmol/L   Glucose, Bld 98 70 - 99 mg/dL    Comment: Glucose reference range applies only to samples taken after fasting for at least 8 hours.   BUN 27 (H) 8 - 23 mg/dL   Creatinine 1.19 1.47 - 1.24 mg/dL   Calcium  9.4 8.9 - 10.3 mg/dL   Total Protein 6.6 6.5 - 8.1 g/dL   Albumin 3.8 3.5 - 5.0 g/dL   AST 25 15 - 41 U/L   ALT 37 0 - 44 U/L   Alkaline Phosphatase 40 38 - 126 U/L   Total Bilirubin 0.2 0.0 - 1.2 mg/dL   GFR, Estimated 57 (L) >60 mL/min    Comment: (NOTE) Calculated using the CKD-EPI Creatinine Equation (2021)    Anion gap 11 5 - 15    Comment: Performed at [facility] at [facility], [address], [place], [place] [address]  D-dimer, quantitative     Status: Abnormal   Collection Time: 04/29/23 10:30 AM  Result Value Ref Range   D-Dimer, Quant 1.40 (H) 0.00 - 0.50 ug/mL-FEU    Comment: (NOTE) At the manufacturer cut-off value of 0.5 g/mL FEU, this assay has a negative predictive value of 95-100%.This assay is intended for use in conjunction with a clinical pretest probability (PTP) assessment model to exclude pulmonary embolism (PE) and deep venous thrombosis (DVT) in outpatients suspected of PE or DVT. Results should be correlated with clinical presentation. Performed at [facility], [address] [place], [place] [address]   CBC with Differential (  Cancer Center Only)     Status: Abnormal   Collection Time: 04/29/23 10:30 AM  Result Value Ref Range   WBC Count 6.4 4.0 - 10.5 K/uL   RBC 3.85 (L) 4.22 - 5.81 MIL/uL   Hemoglobin 12.6 (L) 13.0 - 17.0 g/dL   HCT 81.1 (L) 91.4 - 78.2 %   MCV 99.0 80.0 - 100.0 fL   MCH 32.7 26.0 - 34.0 pg   MCHC 33.1 30.0 - 36.0 g/dL   RDW 95.6 21.3 - 08.6 %   Platelet Count 258 150 - 400 K/uL   nRBC 0.0 0.0 - 0.2 %   Neutrophils Relative % 68 %   Neutro Abs 4.4 1.7 - 7.7 K/uL   Lymphocytes Relative 18 %    Lymphs Abs 1.2 0.7 - 4.0 K/uL   Monocytes Relative 9 %   Monocytes Absolute 0.6 0.1 - 1.0 K/uL   Eosinophils Relative 3 %   Eosinophils Absolute 0.2 0.0 - 0.5 K/uL   Basophils Relative 1 %   Basophils Absolute 0.0 0.0 - 0.1 K/uL   Immature Granulocytes 1 %   Abs Immature Granulocytes 0.04 0.00 - 0.07 K/uL    Comment: Performed at [facility], [address] [place], [place] [address]      RADIOGRAPHY: No results found.     PATHOLOGY:  None  ASSESSMENT/PLAN: Mr. 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 He had been on Eliquis  5 mg PO BID and a baby aspirin  daily (past history of CABG). He now has a new RLE DVT that occurred while on Eliquis  and aspirin .  He is currently on Xarelto  20 mg PO daily and tolerating nicely.  Hyper coag panel is pending.  Follow-up in July with repeat US  of the RLE at that same time.  We will also consider Vascular referral if needed.   All questions were answered. The patient knows to call the clinic with any problems, questions or concerns. We can certainly see the patient much sooner if necessary.  The patient was discussed with Dr. Maria Shiner and he is in agreement with the aforementioned.   Kennard Pea, NP

## 2023-04-30 DIAGNOSIS — I824Y1 Acute embolism and thrombosis of unspecified deep veins of right proximal lower extremity: Secondary | ICD-10-CM | POA: Diagnosis not present

## 2023-04-30 DIAGNOSIS — Z7982 Long term (current) use of aspirin: Secondary | ICD-10-CM | POA: Diagnosis not present

## 2023-04-30 DIAGNOSIS — Z604 Social exclusion and rejection: Secondary | ICD-10-CM | POA: Diagnosis not present

## 2023-04-30 DIAGNOSIS — Z7901 Long term (current) use of anticoagulants: Secondary | ICD-10-CM | POA: Diagnosis not present

## 2023-04-30 LAB — CARDIOLIPIN ANTIBODIES, IGG, IGM, IGA
Anticardiolipin IgA: <9 U/mL (ref 0–11)
Anticardiolipin IgG: <9 GPL U/mL (ref 0–14)
Anticardiolipin IgM: 18 [MPL'U]/mL — ABNORMAL HIGH (ref 0–12)

## 2023-04-30 LAB — HOMOCYSTEINE: Homocysteine: 13.1 umol/L (ref 0.0–21.3)

## 2023-04-30 LAB — PROTEIN S ACTIVITY: Protein S Activity: 203 % — ABNORMAL HIGH (ref 63–140)

## 2023-04-30 LAB — PROTEIN C ACTIVITY: Protein C Activity: 163 % (ref 73–180)

## 2023-04-30 LAB — PROTEIN S, TOTAL: Protein S Ag, Total: 111 % (ref 60–150)

## 2023-05-01 DIAGNOSIS — D485 Neoplasm of uncertain behavior of skin: Secondary | ICD-10-CM | POA: Diagnosis not present

## 2023-05-01 DIAGNOSIS — L570 Actinic keratosis: Secondary | ICD-10-CM | POA: Diagnosis not present

## 2023-05-01 DIAGNOSIS — L821 Other seborrheic keratosis: Secondary | ICD-10-CM | POA: Diagnosis not present

## 2023-05-01 LAB — BETA-2-GLYCOPROTEIN I ABS, IGG/M/A
Beta-2 Glyco I IgG: <9 GPI IgG units (ref 0–20)
Beta-2-Glycoprotein I IgA: <9 GPI IgA units (ref 0–25)
Beta-2-Glycoprotein I IgM: <9 GPI IgM units (ref 0–32)

## 2023-05-01 LAB — PROTEIN C, TOTAL: Protein C, Total: 138 % (ref 60–150)

## 2023-05-02 ENCOUNTER — Telehealth: Payer: Self-pay | Admitting: Pharmacy Technician

## 2023-05-02 ENCOUNTER — Other Ambulatory Visit: Payer: Self-pay | Admitting: Cardiology

## 2023-05-02 NOTE — Telephone Encounter (Signed)
 Attempted to contact patient.  Patient asked that I speak with daughter, Bridgette Campus.  Bridgette Campus told me that it was not a good time to talk with her.  She asked me to call back later this afternoon.    I attempted to call both Bridgette Campus and patient.  Unable to reach.  Left voicemail message.  Penny Boxer Patient Pharmacologist [facility]

## 2023-05-03 LAB — DRVVT MIX: dRVVT Mix: 120 s — ABNORMAL HIGH (ref 0.0–40.4)

## 2023-05-03 LAB — LUPUS ANTICOAGULANT PANEL
DRVVT: >180 s — ABNORMAL HIGH (ref 0.0–47.0)
PTT Lupus Anticoagulant: 69.5 s — ABNORMAL HIGH (ref 0.0–43.5)

## 2023-05-03 LAB — PROTHROMBIN GENE MUTATION

## 2023-05-03 LAB — FACTOR 5 LEIDEN

## 2023-05-03 LAB — DRVVT CONFIRM: dRVVT Confirm: >1.8 ratio — ABNORMAL HIGH (ref 0.8–1.2)

## 2023-05-03 LAB — PTT-LA MIX: PTT-LA Mix: 57.8 s — ABNORMAL HIGH (ref 0.0–40.5)

## 2023-05-03 LAB — HEXAGONAL PHASE PHOSPHOLIPID: Hexagonal Phase Phospholipid: 6 s (ref 0–11)

## 2023-05-06 ENCOUNTER — Other Ambulatory Visit (HOSPITAL_BASED_OUTPATIENT_CLINIC_OR_DEPARTMENT_OTHER): Payer: Self-pay

## 2023-05-06 DIAGNOSIS — I824Y1 Acute embolism and thrombosis of unspecified deep veins of right proximal lower extremity: Secondary | ICD-10-CM | POA: Diagnosis not present

## 2023-05-06 DIAGNOSIS — Z7982 Long term (current) use of aspirin: Secondary | ICD-10-CM | POA: Diagnosis not present

## 2023-05-06 DIAGNOSIS — Z604 Social exclusion and rejection: Secondary | ICD-10-CM | POA: Diagnosis not present

## 2023-05-06 DIAGNOSIS — Z7901 Long term (current) use of anticoagulants: Secondary | ICD-10-CM | POA: Diagnosis not present

## 2023-05-06 MED ORDER — METOPROLOL TARTRATE 25 MG PO TABS: 25.0000 mg | ORAL_TABLET | Freq: Two times a day (BID) | ORAL | 0 refills | Status: DC

## 2023-05-06 MED ORDER — BENAZEPRIL HCL 40 MG PO TABS
40.0000 mg | ORAL_TABLET | Freq: Every day | ORAL | 0 refills | Status: DC
  Filled 2023-06-27: qty 90, 90d supply, fill #0

## 2023-05-06 MED ORDER — APIXABAN 5 MG PO TABS: 5.0000 mg | ORAL_TABLET | Freq: Two times a day (BID) | ORAL | 0 refills | Status: DC

## 2023-05-07 ENCOUNTER — Other Ambulatory Visit (HOSPITAL_BASED_OUTPATIENT_CLINIC_OR_DEPARTMENT_OTHER): Payer: Self-pay

## 2023-05-08 ENCOUNTER — Encounter: Payer: Self-pay | Admitting: *Deleted

## 2023-05-08 ENCOUNTER — Other Ambulatory Visit: Payer: Self-pay | Admitting: Cardiology

## 2023-05-08 DIAGNOSIS — E785 Hyperlipidemia, unspecified: Secondary | ICD-10-CM

## 2023-05-08 DIAGNOSIS — I25110 Atherosclerotic heart disease of native coronary artery with unstable angina pectoris: Secondary | ICD-10-CM

## 2023-05-10 ENCOUNTER — Encounter: Payer: Self-pay | Admitting: Cardiology

## 2023-05-10 ENCOUNTER — Ambulatory Visit: Attending: Cardiology | Admitting: Cardiology

## 2023-05-10 ENCOUNTER — Other Ambulatory Visit (HOSPITAL_BASED_OUTPATIENT_CLINIC_OR_DEPARTMENT_OTHER): Payer: Self-pay

## 2023-05-10 VITALS — BP 132/60 | HR 63 | Ht 68.0 in | Wt 202.4 lb

## 2023-05-10 DIAGNOSIS — E785 Hyperlipidemia, unspecified: Secondary | ICD-10-CM | POA: Insufficient documentation

## 2023-05-10 DIAGNOSIS — I82461 Acute embolism and thrombosis of right calf muscular vein: Secondary | ICD-10-CM | POA: Insufficient documentation

## 2023-05-10 DIAGNOSIS — D6859 Other primary thrombophilia: Secondary | ICD-10-CM | POA: Diagnosis not present

## 2023-05-10 DIAGNOSIS — I480 Paroxysmal atrial fibrillation: Secondary | ICD-10-CM | POA: Insufficient documentation

## 2023-05-10 DIAGNOSIS — I10 Essential (primary) hypertension: Secondary | ICD-10-CM | POA: Diagnosis not present

## 2023-05-10 DIAGNOSIS — I82402 Acute embolism and thrombosis of unspecified deep veins of left lower extremity: Secondary | ICD-10-CM | POA: Diagnosis not present

## 2023-05-10 MED ORDER — RIVAROXABAN 20 MG PO TABS
20.0000 mg | ORAL_TABLET | Freq: Every day | ORAL | 3 refills | Status: DC
  Filled 2023-05-10 – 2023-07-22 (×2): qty 90, 90d supply, fill #0

## 2023-05-10 MED ORDER — PRALUENT 75 MG/ML ~~LOC~~ SOAJ
75.0000 mg | SUBCUTANEOUS | 3 refills | Status: AC
  Filled 2023-05-10 – 2023-07-11 (×5): qty 6, 84d supply, fill #0
  Filled 2023-09-30: qty 6, 84d supply, fill #1
  Filled 2023-12-17: qty 6, 84d supply, fill #2

## 2023-05-10 NOTE — Progress Notes (Unsigned)
 Cardiology Office Note:    Date:  05/10/2023   ID:  [name], DOB 07-01-1930, MRN [id number]  PCP:  Jobe Mulder, DO  Cardiologist:  Ralene Burger, MD    Referring MD: Jobe Mulder*   Chief Complaint  Patient presents with   DVT on R foot    History of Present Illness:    Luke [name] is a 89 y.o. male past medical history significant for coronary artery disease status post coronary bypass graft done many years ago, last cardiac catheterization 2019 showing all graft being patent, paroxysmal atrial fibrillation DVT on Eliquis  as well as aspirin  also intolerant to statin recently started PCSK9 agent with good response.  Recently he ended up having acute DVT in the right lower extremities it was while he was taking Eliquis  as well as aspirin  since that time he is being seen by hematologist.  He was switched to Xarelto  20 mg daily after initial 50 mg twice daily for 21 days.  He was noted to have antiphospholipid antibody syndrome.  Seems to be doing well he had a long discussion with me today there is a lot of complaint and issue about his medications.  Denies have any palpitations no cardiac complaints  Past Medical History:  Diagnosis Date   Abnormal EKG    Atrial fibrillation (HCC)    ATRIAL FIBRILLATION, HX OF 04/16/2008   Qualifier: Diagnosis of  By: Jaramillo, Luz     CAD (coronary artery disease)     5 with the left internal mamary to the left anterior  descending coronary artery.   Chest pain 07/18/2016   Coronary artery disease involving native coronary artery of native heart with unstable angina pectoris (HCC)    DEEP VENOUS THROMBOPHLEBITIS, HX OF 04/16/2008   Qualifier: Diagnosis of  By: Malon Seamen     DVT (deep venous thrombosis) (HCC) 02/06/2019   DVT femoral (deep venous thrombosis) with thrombophlebitis (HCC)    Dyslipidemia    Dyslipidemia, goal LDL below 70 04/16/2008   Qualifier: Diagnosis of  By: Jaramillo, Luz     HTN (hypertension)     Hx of CABG    Hyperlipidemia 01/30/2012   Hypertension 04/16/2008   Qualifier: Diagnosis of  By: Jaramillo, Luz     Open wound of right hand with tendon involvement 11/18/2016   Osteoarthritis 01/30/2012   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 S/p Phaco/IOL OS with Alcon SN60WF 19.0D, wound zero, on 02/11/2012   Unstable angina [facility])     Past Surgical History:  Procedure Laterality Date   CORONARY ARTERY BYPASS GRAFT  2001   I & D EXTREMITY Right 11/18/2016   Procedure: IRRIGATION AND DEBRIDEMENT RIGHT LONG AND RING FINGER;  Surgeon: Winston Hawking, MD;  Location: [facility] OR;  Service: Orthopedics;  Laterality: Right;   LEFT HEART CATH AND CORS/GRAFTS ANGIOGRAPHY N/A 07/19/2016   Procedure: Left Heart Cath and Cors/Grafts Angiography;  Surgeon: Lucendia Rusk, MD;  Location: [facility] INVASIVE CV LAB;  Service: Cardiovascular;  Laterality: N/A;    Current Medications: Current Meds  Medication Sig   Alirocumab  (PRALUENT ) 75 MG/ML SOAJ Inject 1 mL (75 mg total) into the skin every 14 (fourteen) days.   amLODipine  (NORVASC ) 5 MG tablet Take 1 tablet (5 mg total) by mouth daily. (Patient taking differently: Take 10 mg by mouth daily.)   apixaban  (ELIQUIS ) 5 MG TABS tablet Take 1 tablet (5 mg total) by mouth 2 (  two) times daily.   aspirin  81 MG tablet Take 81 mg by mouth daily.   atorvastatin  (LIPITOR) 20 MG tablet Take 20 mg by mouth daily.   benazepril  (LOTENSIN ) 40 MG tablet Take 1 tablet (40 mg total) by mouth daily.   benazepril  (LOTENSIN ) 40 MG tablet Take 1 tablet (40 mg total) by mouth daily.   Coenzyme Q10 (COQ10) 200 MG CAPS Take 100 mg by mouth daily.   KRILL OIL PO Take 500 mg by mouth daily. Krill + Omega3   metoprolol  tartrate (LOPRESSOR ) 25 MG tablet Take 1 tablet (25 mg total) by mouth 2 (two) times daily.   metoprolol  tartrate (LOPRESSOR ) 25 MG tablet Take 1 tablet (25 mg total) by mouth 2 (two) times daily.   Multiple  Vitamins-Minerals (CENTRUM SILVER PO) Take 1 tablet by mouth daily. Unknown strength   rivaroxaban  (XARELTO ) 20 MG TABS tablet Take 1 tablet (20 mg total) by mouth daily with supper.   [DISCONTINUED] Alirocumab  (PRALUENT ) 75 MG/ML SOAJ INJECT 1 ML (75 MG TOTAL) INTO THE SKIN EVERY 14 (FOURTEEN) DAYS.   [DISCONTINUED] ezetimibe  (ZETIA ) 10 MG tablet Take 10 mg by mouth daily.     Allergies:   Statins   Social History   Socioeconomic History   Marital status: Widowed    Spouse name: Not on file   Number of children: Not on file   Years of education: Not on file   Highest education level: Not on file  Occupational History   Not on file  Tobacco Use   Smoking status: Former   Smokeless tobacco: Never   Tobacco comments:    stopped in the 1950's  Vaping Use   Vaping status: Never Used  Substance and Sexual Activity   Alcohol use: Yes    Alcohol/week: 0.0 standard drinks of alcohol    Comment: occ   Drug use: No   Sexual activity: Not on file  Other Topics Concern   Not on file  Social History Narrative   He lives in [place] with his wife.  He does not smoke.He drives about two hours a day which increases his risk for DVT.  He is retired.  He is a Agricultural consultant at [employer].          Social Drivers of Health   Financial Resource Strain: Medium Risk (03/09/2021)   Overall Financial Resource Strain (CARDIA)    Difficulty of Paying Living Expenses: Somewhat hard  Food Insecurity: No Food Insecurity (03/20/2022)   Hunger Vital Sign    Worried About Running Out of Food in the Last Year: Never true    Ran Out of Food in the Last Year: Never true  Transportation Needs: No Transportation Needs (03/20/2022)   PRAPARE - Administrator, Civil Service (Medical): No    Lack of Transportation (Non-Medical): No  Physical Activity: Insufficiently Active (03/09/2021)   Exercise Vital Sign    Days of Exercise per Week: 3 days    Minutes of Exercise per Session:  30 min  Stress: No Stress Concern Present (03/09/2021)   [employer] of Occupational Health - Occupational Stress Questionnaire    Feeling of Stress : Not at all  Social Connections: Socially Isolated (03/09/2021)   Social Connection and Isolation Panel [NHANES]    Frequency of Communication with Friends and Family: Three times a week    Frequency of Social Gatherings with Friends and Family: Three times a week    Attends Religious Services: Never  Active Member of Clubs or Organizations: No    Attends Banker Meetings: Never    Marital Status: Widowed     Family History: The patient's family history includes Cancer in his mother; Coronary artery disease in an other family member; Heart attack in his father; Heart disease in his father and mother. ROS:   Please see the history of present illness.    All 14 point review of systems negative except as described per history of present illness  EKGs/Labs/Other Studies Reviewed:    EKG Interpretation Date/Time:  Friday May 10 2023 11:14:09 EDT Ventricular Rate:  60 PR Interval:    QRS Duration:  90 QT Interval:  400 QTC Calculation: 400 R Axis:   5  Text Interpretation: Atrial fibrillation Nonspecific ST abnormality Abnormal ECG When compared with ECG of 18-Nov-2016 17:18, PREVIOUS ECG IS PRESENT Confirmed by Ralene Burger [phone]) on 05/10/2023 11:25:15 AM    Recent Labs: 11/26/2022: TSH 1.490 04/29/2023: ALT 37; BUN 27; Creatinine 1.21; Hemoglobin 12.6; Platelet Count 258; Potassium 4.8; Sodium 145  Recent Lipid Panel    Component Value Date/Time   CHOL 90 (L) 11/19/2022 0954   TRIG 130 11/19/2022 0954   TRIG 89 11/15/2005 0820   HDL 44 11/19/2022 0954   CHOLHDL 2.0 11/19/2022 0954   CHOLHDL 4.2 02/28/2015 1541   VLDL 23 02/28/2015 1541   LDLCALC 23 11/19/2022 0954   LDLDIRECT 81 01/27/2021 1106   LDLDIRECT 139.9 06/18/2006 1008    Physical Exam:    VS:  BP 132/60 (BP Location: Right Arm, Patient  Position: Sitting)   Pulse 63   Ht 5\' 8"  (1.727 m)   Wt 202 lb 6.4 oz (91.8 kg)   SpO2 96%   BMI 30.77 kg/m     Wt Readings from Last 3 Encounters:  05/10/23 202 lb 6.4 oz (91.8 kg)  04/29/23 203 lb (92.1 kg)  04/24/23 209 lb (94.8 kg)     GEN:  Well nourished, well developed in no acute distress HEENT: Normal NECK: No JVD; No carotid bruits LYMPHATICS: No lymphadenopathy CARDIAC: RRR, no murmurs, no rubs, no gallops RESPIRATORY:  Clear to auscultation without rales, wheezing or rhonchi  ABDOMEN: Soft, non-tender, non-distended MUSCULOSKELETAL:  No edema; No deformity  SKIN: Warm and dry LOWER EXTREMITIES: no swelling NEUROLOGIC:  Alert and oriented x 3 PSYCHIATRIC:  Normal affect   ASSESSMENT:    1. Primary hypertension   2. Paroxysmal atrial fibrillation (HCC)   3. Dyslipidemia, goal LDL below 70   4. Acute deep vein thrombosis (DVT) of left lower extremity, unspecified vein (HCC)    PLAN:    In order of problems listed above:  Essential hypertension blood pressure seems to be well-controlled continue present management. Paroxysmal atrial fibrillation, continue anticoagulation stable no palpitations no rhythm issue. Dyslipidemia I did review K PN which show me his LDL 23 HDL 44.  Will stop his Zetia  I will continue with Lipitor 3 times a week as well as Praluent . Acute DVT involving right lower extremity surprising he is being anticoagulated taking medication religiously.  He was discovered to have antiphospholipid antibody syndrome and that is being addressed by his hematologist   Medication Adjustments/Labs and Tests Ordered: Current medicines are reviewed at length with the patient today.  Concerns regarding medicines are outlined above.  Orders Placed This Encounter  Procedures   EKG 12-Lead   Medication changes: No orders of the defined types were placed in this encounter.   Signed, Manfred Seed, MD,  [facility] 05/10/2023 11:57 AM    [facility]

## 2023-05-10 NOTE — Patient Instructions (Signed)
Medication Instructions:  Your physician recommends that you continue on your current medications as directed. Please refer to the Current Medication list given to you today.  *If you need a refill on your cardiac medications before your next appointment, please call your pharmacy*   Lab Work: None Ordered If you have labs (blood work) drawn today and your tests are completely normal, you will receive your results only by: MyChart Message (if you have MyChart) OR A paper copy in the mail If you have any lab test that is abnormal or we need to change your treatment, we will call you to review the results.   Testing/Procedures: None Ordered   Follow-Up: At [facility], you and your health needs are our priority.  As part of our continuing mission to provide you with exceptional heart care, we have created designated Provider Care Teams.  These Care Teams include your primary Cardiologist (physician) and Advanced Practice Providers (APPs -  Physician Assistants and Nurse Practitioners) who all work together to provide you with the care you need, when you need it.  We recommend signing up for the patient portal called "MyChart".  Sign up information is provided on this After Visit Summary.  MyChart is used to connect with patients for Virtual Visits (Telemedicine).  Patients are able to view lab/test results, encounter notes, upcoming appointments, etc.  Non-urgent messages can be sent to your provider as well.   To learn more about what you can do with MyChart, go to [email or web address].    Your next appointment:   5 month(s)  The format for your next appointment:   In Person  Provider:   Robert Krasowski, MD    Other Instructions NA  

## 2023-05-13 ENCOUNTER — Other Ambulatory Visit: Payer: Self-pay | Admitting: Cardiology

## 2023-05-13 ENCOUNTER — Other Ambulatory Visit: Payer: Self-pay

## 2023-05-13 ENCOUNTER — Other Ambulatory Visit (HOSPITAL_BASED_OUTPATIENT_CLINIC_OR_DEPARTMENT_OTHER): Payer: Self-pay

## 2023-05-13 MED ORDER — METOPROLOL TARTRATE 25 MG PO TABS
25.0000 mg | ORAL_TABLET | Freq: Two times a day (BID) | ORAL | 1 refills | Status: DC
  Filled 2023-05-13 – 2023-06-27 (×2): qty 180, 90d supply, fill #0

## 2023-05-13 MED ORDER — AMLODIPINE BESYLATE 10 MG PO TABS
10.0000 mg | ORAL_TABLET | Freq: Every day | ORAL | 3 refills | Status: DC
  Filled 2023-05-13 – 2023-07-09 (×3): qty 90, 90d supply, fill #0

## 2023-05-13 MED ORDER — ATORVASTATIN CALCIUM 10 MG PO TABS
10.0000 mg | ORAL_TABLET | ORAL | 1 refills | Status: DC
  Filled 2023-05-13 – 2023-07-09 (×3): qty 36, 84d supply, fill #0
  Filled 2023-09-16: qty 36, 84d supply, fill #1

## 2023-05-13 NOTE — Telephone Encounter (Signed)
 Medication sent using instructions on last OV:   Dyslipidemia I did review K PN which show me his LDL 23 HDL 44. Will stop his Zetia  I will continue with Lipitor 3 times a week as well as Praluent .

## 2023-05-14 ENCOUNTER — Encounter: Payer: Self-pay | Admitting: Pharmacy Technician

## 2023-05-14 DIAGNOSIS — Z7901 Long term (current) use of anticoagulants: Secondary | ICD-10-CM | POA: Diagnosis not present

## 2023-05-14 DIAGNOSIS — Z604 Social exclusion and rejection: Secondary | ICD-10-CM | POA: Diagnosis not present

## 2023-05-14 DIAGNOSIS — I824Y1 Acute embolism and thrombosis of unspecified deep veins of right proximal lower extremity: Secondary | ICD-10-CM | POA: Diagnosis not present

## 2023-05-14 DIAGNOSIS — Z7982 Long term (current) use of aspirin: Secondary | ICD-10-CM | POA: Diagnosis not present

## 2023-05-14 NOTE — Progress Notes (Signed)
 Met with patient to go over PAP application for Xarelto .  Patient signed his portion and provided proof of income.  Provider portion signed by Kennard Pea, NP.  Faxed to [employer] for processing.  I have requested medication be sent to patient's home.  If [employer] approves patient, he will receive medication through 01/08/24.  Patient will need to reapply starting 01/09/24.  Patient is aware.  Penny Boxer Patient Pharmacologist [facility]

## 2023-05-23 ENCOUNTER — Other Ambulatory Visit (HOSPITAL_BASED_OUTPATIENT_CLINIC_OR_DEPARTMENT_OTHER): Payer: Self-pay

## 2023-05-23 ENCOUNTER — Telehealth: Payer: Self-pay | Admitting: Pharmacy Technician

## 2023-05-23 NOTE — Telephone Encounter (Signed)
 Spoke with Chief of Staff at [employer].  Patient approved to receive medication assistance with Xarelto  through 01/08/24.  Patient will need to reapply for assistance in 2026.  Xarelto  sent by [employer] to patient's home on 05/20/23.  Tracking #[id number].  Made patient aware.  Penny Boxer Patient Pharmacologist [facility]

## 2023-05-25 DIAGNOSIS — Z7901 Long term (current) use of anticoagulants: Secondary | ICD-10-CM | POA: Diagnosis not present

## 2023-05-25 DIAGNOSIS — I824Y1 Acute embolism and thrombosis of unspecified deep veins of right proximal lower extremity: Secondary | ICD-10-CM | POA: Diagnosis not present

## 2023-05-25 DIAGNOSIS — Z604 Social exclusion and rejection: Secondary | ICD-10-CM | POA: Diagnosis not present

## 2023-05-25 DIAGNOSIS — Z7982 Long term (current) use of aspirin: Secondary | ICD-10-CM | POA: Diagnosis not present

## 2023-05-31 DIAGNOSIS — Z7982 Long term (current) use of aspirin: Secondary | ICD-10-CM | POA: Diagnosis not present

## 2023-05-31 DIAGNOSIS — Z604 Social exclusion and rejection: Secondary | ICD-10-CM | POA: Diagnosis not present

## 2023-05-31 DIAGNOSIS — I824Y1 Acute embolism and thrombosis of unspecified deep veins of right proximal lower extremity: Secondary | ICD-10-CM | POA: Diagnosis not present

## 2023-05-31 DIAGNOSIS — Z7901 Long term (current) use of anticoagulants: Secondary | ICD-10-CM | POA: Diagnosis not present

## 2023-06-08 ENCOUNTER — Other Ambulatory Visit: Payer: Self-pay | Admitting: Cardiology

## 2023-06-11 DIAGNOSIS — Z7901 Long term (current) use of anticoagulants: Secondary | ICD-10-CM | POA: Diagnosis not present

## 2023-06-11 DIAGNOSIS — Z7982 Long term (current) use of aspirin: Secondary | ICD-10-CM | POA: Diagnosis not present

## 2023-06-11 DIAGNOSIS — Z604 Social exclusion and rejection: Secondary | ICD-10-CM | POA: Diagnosis not present

## 2023-06-11 DIAGNOSIS — I824Y1 Acute embolism and thrombosis of unspecified deep veins of right proximal lower extremity: Secondary | ICD-10-CM | POA: Diagnosis not present

## 2023-06-20 DIAGNOSIS — Z7901 Long term (current) use of anticoagulants: Secondary | ICD-10-CM | POA: Diagnosis not present

## 2023-06-20 DIAGNOSIS — Z604 Social exclusion and rejection: Secondary | ICD-10-CM | POA: Diagnosis not present

## 2023-06-20 DIAGNOSIS — I824Y1 Acute embolism and thrombosis of unspecified deep veins of right proximal lower extremity: Secondary | ICD-10-CM | POA: Diagnosis not present

## 2023-06-20 DIAGNOSIS — Z7982 Long term (current) use of aspirin: Secondary | ICD-10-CM | POA: Diagnosis not present

## 2023-06-27 ENCOUNTER — Other Ambulatory Visit: Payer: Self-pay

## 2023-06-27 ENCOUNTER — Other Ambulatory Visit (HOSPITAL_BASED_OUTPATIENT_CLINIC_OR_DEPARTMENT_OTHER): Payer: Self-pay

## 2023-06-27 ENCOUNTER — Other Ambulatory Visit: Payer: Self-pay | Admitting: Family

## 2023-06-27 DIAGNOSIS — I82461 Acute embolism and thrombosis of right calf muscular vein: Secondary | ICD-10-CM

## 2023-06-27 DIAGNOSIS — D6859 Other primary thrombophilia: Secondary | ICD-10-CM

## 2023-06-27 DIAGNOSIS — I82402 Acute embolism and thrombosis of unspecified deep veins of left lower extremity: Secondary | ICD-10-CM

## 2023-06-27 MED ORDER — RIVAROXABAN 20 MG PO TABS
20.0000 mg | ORAL_TABLET | Freq: Every day | ORAL | 4 refills | Status: DC
  Filled 2023-06-27: qty 30, 30d supply, fill #0

## 2023-06-28 ENCOUNTER — Other Ambulatory Visit: Payer: Self-pay

## 2023-06-28 ENCOUNTER — Other Ambulatory Visit (HOSPITAL_BASED_OUTPATIENT_CLINIC_OR_DEPARTMENT_OTHER): Payer: Self-pay

## 2023-07-01 ENCOUNTER — Other Ambulatory Visit (HOSPITAL_BASED_OUTPATIENT_CLINIC_OR_DEPARTMENT_OTHER): Payer: Self-pay

## 2023-07-03 ENCOUNTER — Other Ambulatory Visit (HOSPITAL_BASED_OUTPATIENT_CLINIC_OR_DEPARTMENT_OTHER): Payer: Self-pay

## 2023-07-08 ENCOUNTER — Other Ambulatory Visit (HOSPITAL_BASED_OUTPATIENT_CLINIC_OR_DEPARTMENT_OTHER): Payer: Self-pay

## 2023-07-09 ENCOUNTER — Other Ambulatory Visit (HOSPITAL_BASED_OUTPATIENT_CLINIC_OR_DEPARTMENT_OTHER): Payer: Self-pay

## 2023-07-11 ENCOUNTER — Other Ambulatory Visit (HOSPITAL_BASED_OUTPATIENT_CLINIC_OR_DEPARTMENT_OTHER): Payer: Self-pay

## 2023-07-12 ENCOUNTER — Other Ambulatory Visit: Payer: Self-pay | Admitting: Cardiology

## 2023-07-22 ENCOUNTER — Other Ambulatory Visit (HOSPITAL_BASED_OUTPATIENT_CLINIC_OR_DEPARTMENT_OTHER): Payer: Self-pay

## 2023-07-23 ENCOUNTER — Other Ambulatory Visit: Payer: Self-pay | Admitting: Medical Oncology

## 2023-07-23 DIAGNOSIS — I82461 Acute embolism and thrombosis of right calf muscular vein: Secondary | ICD-10-CM

## 2023-07-23 DIAGNOSIS — I82402 Acute embolism and thrombosis of unspecified deep veins of left lower extremity: Secondary | ICD-10-CM

## 2023-07-23 DIAGNOSIS — D6859 Other primary thrombophilia: Secondary | ICD-10-CM

## 2023-07-24 ENCOUNTER — Inpatient Hospital Stay (HOSPITAL_BASED_OUTPATIENT_CLINIC_OR_DEPARTMENT_OTHER): Admitting: Medical Oncology

## 2023-07-24 ENCOUNTER — Ambulatory Visit (HOSPITAL_BASED_OUTPATIENT_CLINIC_OR_DEPARTMENT_OTHER)
Admission: RE | Admit: 2023-07-24 | Discharge: 2023-07-24 | Disposition: A | Discharge status: Home / Self Care | Source: Ambulatory Visit | Attending: Family | Admitting: Family

## 2023-07-24 ENCOUNTER — Other Ambulatory Visit: Payer: Self-pay

## 2023-07-24 ENCOUNTER — Telehealth: Payer: Self-pay | Admitting: Cardiology

## 2023-07-24 ENCOUNTER — Other Ambulatory Visit (HOSPITAL_BASED_OUTPATIENT_CLINIC_OR_DEPARTMENT_OTHER): Payer: Self-pay

## 2023-07-24 ENCOUNTER — Inpatient Hospital Stay: Attending: Hematology & Oncology

## 2023-07-24 VITALS — Ht 69.0 in | Wt 210.1 lb

## 2023-07-24 DIAGNOSIS — I82461 Acute embolism and thrombosis of right calf muscular vein: Secondary | ICD-10-CM | POA: Insufficient documentation

## 2023-07-24 DIAGNOSIS — Z86718 Personal history of other venous thrombosis and embolism: Secondary | ICD-10-CM | POA: Diagnosis not present

## 2023-07-24 DIAGNOSIS — M7121 Synovial cyst of popliteal space [Baker], right knee: Secondary | ICD-10-CM | POA: Diagnosis not present

## 2023-07-24 DIAGNOSIS — I82401 Acute embolism and thrombosis of unspecified deep veins of right lower extremity: Secondary | ICD-10-CM | POA: Diagnosis not present

## 2023-07-24 DIAGNOSIS — Z79899 Other long term (current) drug therapy: Secondary | ICD-10-CM | POA: Diagnosis not present

## 2023-07-24 DIAGNOSIS — Z7901 Long term (current) use of anticoagulants: Secondary | ICD-10-CM | POA: Insufficient documentation

## 2023-07-24 DIAGNOSIS — D6859 Other primary thrombophilia: Secondary | ICD-10-CM

## 2023-07-24 DIAGNOSIS — D6862 Lupus anticoagulant syndrome: Secondary | ICD-10-CM | POA: Diagnosis not present

## 2023-07-24 DIAGNOSIS — I82402 Acute embolism and thrombosis of unspecified deep veins of left lower extremity: Secondary | ICD-10-CM | POA: Diagnosis not present

## 2023-07-24 LAB — CBC
HCT: 37.8 % — ABNORMAL LOW (ref 39.0–52.0)
Hemoglobin: 12.7 g/dL — ABNORMAL LOW (ref 13.0–17.0)
MCH: 33.1 pg (ref 26.0–34.0)
MCHC: 33.6 g/dL (ref 30.0–36.0)
MCV: 98.4 fL (ref 80.0–100.0)
Platelets: 151 K/uL (ref 150–400)
RBC: 3.84 MIL/uL — ABNORMAL LOW (ref 4.22–5.81)
RDW: 12.9 % (ref 11.5–15.5)
WBC: 5.6 K/uL (ref 4.0–10.5)
nRBC: 0 % (ref 0.0–0.2)

## 2023-07-24 LAB — CMP (CANCER CENTER ONLY)
ALT: 10 U/L (ref 0–44)
AST: 13 U/L — ABNORMAL LOW (ref 15–41)
Albumin: 3.9 g/dL (ref 3.5–5.0)
Alkaline Phosphatase: 41 U/L (ref 38–126)
Anion gap: 9 (ref 5–15)
BUN: 30 mg/dL — ABNORMAL HIGH (ref 8–23)
CO2: 24 mmol/L (ref 22–32)
Calcium: 9.1 mg/dL (ref 8.9–10.3)
Chloride: 114 mmol/L — ABNORMAL HIGH (ref 98–111)
Creatinine: 1.33 mg/dL — ABNORMAL HIGH (ref 0.61–1.24)
GFR, Estimated: 50 mL/min — ABNORMAL LOW (ref >60)
Glucose, Bld: 108 mg/dL — ABNORMAL HIGH (ref 70–99)
Potassium: 4.5 mmol/L (ref 3.5–5.1)
Sodium: 147 mmol/L — ABNORMAL HIGH (ref 135–145)
Total Bilirubin: 0.3 mg/dL (ref 0.0–1.2)
Total Protein: 6.3 g/dL — ABNORMAL LOW (ref 6.5–8.1)

## 2023-07-24 MED ORDER — DABIGATRAN ETEXILATE MESYLATE 150 MG PO CAPS
150.0000 mg | ORAL_CAPSULE | Freq: Two times a day (BID) | ORAL | 6 refills | Status: DC
  Filled 2023-07-24: qty 60, 30d supply, fill #0
  Filled 2023-08-16: qty 60, 30d supply, fill #1
  Filled 2023-09-16: qty 60, 30d supply, fill #2
  Filled 2023-10-15: qty 60, 30d supply, fill #3
  Filled 2023-11-14: qty 60, 30d supply, fill #4
  Filled 2023-12-16: qty 60, 30d supply, fill #5

## 2023-07-24 MED ORDER — DABIGATRAN ETEXILATE MESYLATE 150 MG PO CAPS
150.0000 mg | ORAL_CAPSULE | Freq: Two times a day (BID) | ORAL | 6 refills | Status: DC
  Filled 2023-07-24: qty 60, 30d supply, fill #0

## 2023-07-24 NOTE — Telephone Encounter (Signed)
 Called the patient and spoke with Luke [name] per the [facility] and she verbalized that the patient was at his oncology appointment and the blood pressures on his right and left arm were very different. On the right arm the diastolic values were in the 80 - 90's and on the left arm the diastolic values were as low as 44. The oncologist recommended that she call her cardiologist for further guidance. Please advise  Right arm BP's 142/82 146/92 153/64  Left arm BP's 126/66 122/44

## 2023-07-24 NOTE — Progress Notes (Signed)
 Hematology and Oncology Follow Up Visit  Luke [name] [id number] 24-Jan-1930 89 y.o. 07/24/2023  Past Medical History:  Diagnosis Date   Abnormal EKG    Atrial fibrillation (HCC)    ATRIAL FIBRILLATION, HX OF 04/16/2008   Qualifier: Diagnosis of  By: Justina Kos     CAD (coronary artery disease)     5 with the left internal mamary to the left anterior  descending coronary artery.   Chest pain 07/18/2016   Coronary artery disease involving native coronary artery of native heart with unstable angina pectoris (HCC)    DEEP VENOUS THROMBOPHLEBITIS, HX OF 04/16/2008   Qualifier: Diagnosis of  By: Justina Kos     DVT (deep venous thrombosis) (HCC) 02/06/2019   DVT femoral (deep venous thrombosis) with thrombophlebitis (HCC)    Dyslipidemia    Dyslipidemia, goal LDL below 70 04/16/2008   Qualifier: Diagnosis of  By: Justina Kos     HTN (hypertension)    Hx of CABG    Hyperlipidemia 01/30/2012   Hypertension 04/16/2008   Qualifier: Diagnosis of  By: Justina Kos     Open wound of right hand with tendon involvement 11/18/2016   Osteoarthritis 01/30/2012   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 S/p Phaco/IOL OS with Alcon SN60WF 19.0D, wound zero, on 02/11/2012   Unstable angina (HCC)     Principle Diagnosis:  New RLE DVT dx while on Eliquis  - now on Xarelto  20 mg PO daily  History of acute LLE, unprovoked   2016: hx of left lower extremity DVT, treated with warfarin x ~6 months   01/2019: extensive occlusive and non-occlusive DVT from mid-femoral to popliteal and peroneal veins in the left lower extremity. On Eliquis  5mg  BID  Positive Lupus Anticoagulant   Current Therapy:   Xarelto  20 mg PO daily > changing to Pradaxa  07/24/2023  PriorTherapy:   Coumadin- DVT on coumadin  Eliquis - DVT on Eliquis     Interim History:  Luke [name] is back for follow-up for anticoagulation management:  He is here with his daughter  today.   They report that he has been well overall. He has some sciatica which his daughter is trying to convince him to go see orthopedics about. Other than that he is tolerating his Xarelto  well. He has not missed any doses. He is able to afford this medication. The edema and calf pain from his latest clot has resolved.   There has been no bleeding to his knowledge: denies epistaxis, gingivitis, hemoptysis, hematemesis, hematuria, melena, excessive bruising, blood donation.   He denies chest pains, SOB.   Wt Readings from Last 3 Encounters:  07/24/23 210 lb 1.9 oz (95.3 kg)  05/10/23 202 lb 6.4 oz (91.8 kg)  04/29/23 203 lb (92.1 kg)     Medications:   Current Outpatient Medications:    Alirocumab  (PRALUENT ) 75 MG/ML SOAJ, Inject 1 mL (75 mg total) into the skin every 14 (fourteen) days., Disp: 6 mL, Rfl: 3   amLODipine  (NORVASC ) 10 MG tablet, Take 1 tablet (10 mg total) by mouth daily., Disp: 90 tablet, Rfl: 3   amLODipine  (NORVASC ) 5 MG tablet, Take 1 tablet (5 mg total) by mouth daily., Disp: 90 tablet, Rfl: 3   aspirin  81 MG tablet, Take 81 mg by mouth daily., Disp: , Rfl:    atorvastatin  (LIPITOR) 20 MG tablet, Take 20 mg by mouth daily., Disp: 90 tablet, Rfl: 1   benazepril  (LOTENSIN ) 40 MG tablet,  Take 1 tablet (40 mg total) by mouth daily., Disp: 90 tablet, Rfl: 0   Coenzyme Q10 (COQ10) 200 MG CAPS, Take 100 mg by mouth daily., Disp: , Rfl:    KRILL OIL PO, Take 500 mg by mouth daily. Krill + Omega3, Disp: , Rfl:    metoprolol  tartrate (LOPRESSOR ) 25 MG tablet, Take 1 tablet (25 mg total) by mouth 2 (two) times daily., Disp: 180 tablet, Rfl: 1   Multiple Vitamins-Minerals (CENTRUM SILVER PO), Take 1 tablet by mouth daily. Unknown strength, Disp: , Rfl:    rivaroxaban  (XARELTO ) 20 MG TABS tablet, Take 1 tablet (20 mg total) by mouth daily with supper., Disp: 90 tablet, Rfl: 3   atorvastatin  (LIPITOR) 10 MG tablet, Take 1 tablet (10 mg total) by mouth 3 (three) times a week.  (Patient not taking: Reported on 07/24/2023), Disp: 36 tablet, Rfl: 1   benazepril  (LOTENSIN ) 40 MG tablet, Take 1 tablet (40 mg total) by mouth daily. (Patient not taking: Reported on 07/24/2023), Disp: 90 tablet, Rfl: 1   metoprolol  tartrate (LOPRESSOR ) 25 MG tablet, Take 1 tablet (25 mg total) by mouth 2 (two) times daily. (Patient not taking: Reported on 07/24/2023), Disp: 180 tablet, Rfl: 3   rivaroxaban  (XARELTO ) 20 MG TABS tablet, Take 1 tablet (20 mg total) by mouth daily with supper. (Patient not taking: Reported on 07/24/2023), Disp: 30 tablet, Rfl: 4  Allergies:  Allergies  Allergen Reactions   Statins Other (See Comments)    Muscles Cramping    Past Medical History, Surgical history, Social history, and Family History were reviewed and updated.  Review of Systems: Review of Systems  Constitutional: Negative.   HENT:  Negative.    Eyes: Negative.   Respiratory: Negative.    Cardiovascular: Negative.   Gastrointestinal: Negative.   Endocrine: Negative.   Genitourinary: Negative.    Musculoskeletal:  Positive for arthralgias.  Skin: Negative.   Neurological: Negative.   Hematological: Negative.   Psychiatric/Behavioral: Negative.       Physical Exam:  height is 5' 9 (1.753 m) and weight is 210 lb 1.9 oz (95.3 kg). His oral temperature is 98.7 F (37.1 C) (pended). His blood pressure is 120/44 (abnormal, pended) and his pulse is 62 (pended). His respiration is 18 (pended) and oxygen saturation is 95% (pended).   Physical Exam General: NAD Cardiovascular: regular rate and rhythm Pulmonary: clear ant fields Abdomen: soft, nontender, + bowel sounds GU: no suprapubic tenderness Extremities: no edema, no joint deformities Skin: no rashes Neurological: Weakness but otherwise nonfocal   Lab Results  Component Value Date   WBC 5.6 07/24/2023   HGB 12.7 (L) 07/24/2023   HCT 37.8 (L) 07/24/2023   MCV 98.4 07/24/2023   PLT 151 07/24/2023     Chemistry      Component  Value Date/Time   NA 147 (H) 07/24/2023 1228   NA 146 (H) 11/26/2022 0901   K 4.5 07/24/2023 1228   CL 114 (H) 07/24/2023 1228   CO2 24 07/24/2023 1228   BUN 30 (H) 07/24/2023 1228   BUN 26 11/26/2022 0901   CREATININE 1.33 (H) 07/24/2023 1228   CREATININE 1.14 (H) 02/28/2015 1541      Component Value Date/Time   CALCIUM  9.1 07/24/2023 1228   ALKPHOS 41 07/24/2023 1228   AST 13 (L) 07/24/2023 1228   ALT 10 07/24/2023 1228   BILITOT 0.3 07/24/2023 1228     Encounter Diagnoses  Name Primary?   Primary hypercoagulable state (HCC) Yes   Acute  deep vein thrombosis (DVT) of left lower extremity, unspecified vein (HCC)     Assessment and Plan- Patient is a 89 y.o. 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   I have reviewed his anticoagulation testing which shows a positive lupus anticoagulant and slightly elevated anticardiolipin IgM levels. I have also reviewed his repeat doppler scan from today which shows lack of resolution of his DVT. Given all of this information I have suggested a change from the Xarelto  to Pradaxa . We discussed that he will start the Pradaxa  at the time that he would normally take the Xarelto . I discussed that although they both work to help dissolve his DVT and prevent new ones from forming, they work in different pathways. They are agreeable at this time.     Disposition: RTC 4 months APP with repeat doppler performed same day or 1-2 days prior.   Lauraine Dais PA-C 7/16/20251:19 PM

## 2023-07-24 NOTE — Telephone Encounter (Signed)
 Pt c/o BP issue: STAT if pt c/o blurred vision, one-sided weakness or slurred speech.  STAT if BP is GREATER than 180/120 TODAY.  STAT if BP is LESS than 90/60 and SYMPTOMATIC TODAY  1. What is your BP concern? BP too low   2. Have you taken any BP medication today? Yes  3. What are your last 5 BP readings?122/44 142/82 146/92 153/64 126/66 4. Are you having any other symptoms (ex. Dizziness, headache, blurred vision, passed out)? No

## 2023-07-30 NOTE — Telephone Encounter (Signed)
 Called the patient to set up an appointment with Dr. Krasowski regarding his blood pressure discrepancy and he stated that has now been fixed and he doesn't want to set-up an appointment at this time.  Patient stated that he went to see his hematologist and as a result of their testing he stated that his blood clot has not totally gone away. As a result the hematologist changed him from Xarelto  to Pradaxa . Patient is asking if changing from Xarelto  to Pradaxa  will affect any of his other cardiac related medication? Please advise.

## 2023-08-07 NOTE — Telephone Encounter (Signed)
 Called the patient and informed him of Dr. Karry recommendation below:  I will Pradaxa  should be fine  Patient verbalized understanding and had no further questions at this time.

## 2023-08-16 ENCOUNTER — Other Ambulatory Visit: Payer: Self-pay | Admitting: Cardiology

## 2023-08-16 ENCOUNTER — Other Ambulatory Visit (HOSPITAL_BASED_OUTPATIENT_CLINIC_OR_DEPARTMENT_OTHER): Payer: Self-pay

## 2023-08-16 ENCOUNTER — Other Ambulatory Visit: Payer: Self-pay

## 2023-08-16 MED ORDER — BENAZEPRIL HCL 40 MG PO TABS
40.0000 mg | ORAL_TABLET | Freq: Every day | ORAL | 3 refills | Status: DC
  Filled 2023-08-16: qty 12, 12d supply, fill #0
  Filled 2023-08-16 – 2023-09-16 (×2): qty 90, 90d supply, fill #0
  Filled 2023-12-17: qty 90, 90d supply, fill #1

## 2023-08-21 DIAGNOSIS — M545 Low back pain, unspecified: Secondary | ICD-10-CM | POA: Diagnosis not present

## 2023-09-16 ENCOUNTER — Other Ambulatory Visit: Payer: Self-pay | Admitting: Cardiology

## 2023-09-16 ENCOUNTER — Other Ambulatory Visit (HOSPITAL_BASED_OUTPATIENT_CLINIC_OR_DEPARTMENT_OTHER): Payer: Self-pay

## 2023-09-16 ENCOUNTER — Other Ambulatory Visit: Payer: Self-pay

## 2023-09-18 ENCOUNTER — Other Ambulatory Visit (HOSPITAL_BASED_OUTPATIENT_CLINIC_OR_DEPARTMENT_OTHER): Payer: Self-pay

## 2023-09-18 MED ORDER — METOPROLOL TARTRATE 25 MG PO TABS
25.0000 mg | ORAL_TABLET | Freq: Two times a day (BID) | ORAL | 2 refills | Status: DC
  Filled 2023-09-18 – 2023-09-19 (×2): qty 180, 90d supply, fill #0
  Filled 2023-12-17: qty 180, 90d supply, fill #1

## 2023-09-19 ENCOUNTER — Ambulatory Visit

## 2023-09-19 ENCOUNTER — Other Ambulatory Visit (HOSPITAL_BASED_OUTPATIENT_CLINIC_OR_DEPARTMENT_OTHER): Payer: Self-pay

## 2023-09-19 VITALS — Ht 69.0 in | Wt 200.0 lb

## 2023-09-19 DIAGNOSIS — Z0000 Encounter for general adult medical examination without abnormal findings: Secondary | ICD-10-CM | POA: Diagnosis not present

## 2023-09-19 NOTE — Progress Notes (Signed)
 Subjective:   Luke [name] is a 89 y.o. who presents for a Medicare Wellness preventive visit.  As a reminder, Annual Wellness Visits don't include a physical exam, and some assessments may be limited, especially if this visit is performed virtually. We may recommend an in-person follow-up visit with your provider if needed.  Visit Complete: Virtual I connected with  Luke [name] on 09/19/23 by a audio enabled telemedicine application and verified that I am speaking with the correct person using two identifiers.  Patient Location: Home  Provider Location: Home Office  I discussed the limitations of evaluation and management by telemedicine. The patient expressed understanding and agreed to proceed.  Vital Signs: Because this visit was a virtual/telehealth visit, some criteria may be missing or patient reported. Any vitals not documented were not able to be obtained and vitals that have been documented are patient reported.    Persons Participating in Visit: Patient.  AWV Questionnaire: No: Patient Medicare AWV questionnaire was not completed prior to this visit.  Cardiac Risk Factors include: advanced age (>62men, >53 women);male gender;hypertension;dyslipidemia     Objective:    Today's Vitals   09/19/23 1546  Weight: 200 lb (90.7 kg)  Height: 5' 9 (1.753 m)   Body mass index is 29.53 kg/m.     09/19/2023    3:58 PM 07/24/2023    1:17 PM 04/29/2023   11:14 AM 04/20/2023   12:02 PM 03/20/2022    1:07 PM 12/12/2021    1:48 PM 03/09/2021    1:49 PM  Advanced Directives  Does Patient Have a Medical Advance Directive? Yes No No No Yes Yes Yes  Type of Estate agent of [place];Living will    Healthcare Power of [place];Living will Healthcare Power of [place];Living will Healthcare Power of [place];Living will  Does patient want to make changes to medical advance directive?  No - Patient declined       Copy of Healthcare Power of Attorney in Chart?  No - copy requested    No - copy requested No - copy requested No - copy requested  Would patient like information on creating a medical advance directive?    No - Patient declined       Current Medications (verified) Outpatient Encounter Medications as of 09/19/2023  Medication Sig   Alirocumab  (PRALUENT ) 75 MG/ML SOAJ Inject 1 mL (75 mg total) into the skin every 14 (fourteen) days.   amLODipine  (NORVASC ) 10 MG tablet Take 1 tablet (10 mg total) by mouth daily.   amLODipine  (NORVASC ) 5 MG tablet Take 1 tablet (5 mg total) by mouth daily.   aspirin  81 MG tablet Take 81 mg by mouth daily.   atorvastatin  (LIPITOR) 10 MG tablet Take 1 tablet (10 mg total) by mouth 3 (three) times a week. (Patient not taking: Reported on 07/24/2023)   atorvastatin  (LIPITOR) 20 MG tablet Take 20 mg by mouth daily.   benazepril  (LOTENSIN ) 40 MG tablet Take 1 tablet (40 mg total) by mouth daily. (Patient not taking: Reported on 07/24/2023)   benazepril  (LOTENSIN ) 40 MG tablet Take 1 tablet (40 mg total) by mouth daily.   Coenzyme Q10 (COQ10) 200 MG CAPS Take 100 mg by mouth daily.   dabigatran  (PRADAXA ) 150 MG CAPS capsule Take 1 capsule (150 mg total) by mouth 2 (two) times daily.   KRILL OIL PO Take 500 mg by mouth daily. Krill + Omega3   metoprolol  tartrate (LOPRESSOR ) 25 MG tablet Take 1 tablet (25 mg total)  by mouth 2 (two) times daily. (Patient not taking: Reported on 07/24/2023)   metoprolol  tartrate (LOPRESSOR ) 25 MG tablet Take 1 tablet (25 mg total) by mouth 2 (two) times daily.   Multiple Vitamins-Minerals (CENTRUM SILVER PO) Take 1 tablet by mouth daily. Unknown strength   No facility-administered encounter medications on file as of 09/19/2023.    Allergies (verified) Statins   History: Past Medical History:  Diagnosis Date   Abnormal EKG    Atrial fibrillation (HCC)    ATRIAL FIBRILLATION, HX OF 04/16/2008   Qualifier: Diagnosis of  By: Jaramillo, Luz     CAD (coronary artery disease)     5 with  the left internal mamary to the left anterior  descending coronary artery.   Chest pain 07/18/2016   Coronary artery disease involving native coronary artery of native heart with unstable angina pectoris (HCC)    DEEP VENOUS THROMBOPHLEBITIS, HX OF 04/16/2008   Qualifier: Diagnosis of  By: Justina Kos     DVT (deep venous thrombosis) (HCC) 02/06/2019   DVT femoral (deep venous thrombosis) with thrombophlebitis (HCC)    Dyslipidemia    Dyslipidemia, goal LDL below 70 04/16/2008   Qualifier: Diagnosis of  By: Jaramillo, Luz     HTN (hypertension)    Hx of CABG    Hyperlipidemia 01/30/2012   Hypertension 04/16/2008   Qualifier: Diagnosis of  By: Jaramillo, Luz     Open wound of right hand with tendon involvement 11/18/2016   Osteoarthritis 01/30/2012   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 S/p Phaco/IOL OS with Alcon SN60WF 19.0D, wound zero, on 02/11/2012   Unstable angina [facility])    Past Surgical History:  Procedure Laterality Date   CORONARY ARTERY BYPASS GRAFT  2001   I & D EXTREMITY Right 11/18/2016   Procedure: IRRIGATION AND DEBRIDEMENT RIGHT LONG AND RING FINGER;  Surgeon: Kay Kemps, MD;  Location: [facility] OR;  Service: Orthopedics;  Laterality: Right;   LEFT HEART CATH AND CORS/GRAFTS ANGIOGRAPHY N/A 07/19/2016   Procedure: Left Heart Cath and Cors/Grafts Angiography;  Surgeon: Dann Candyce RAMAN, MD;  Location: [facility] INVASIVE CV LAB;  Service: Cardiovascular;  Laterality: N/A;   Family History  Problem Relation Age of Onset   Heart disease Mother    Cancer Mother    Heart disease Father    Heart attack Father    Coronary artery disease Other    Social History   Socioeconomic History   Marital status: Widowed    Spouse name: Not on file   Number of children: Not on file   Years of education: Not on file   Highest education level: Not on file  Occupational History   Not on file  Tobacco Use   Smoking status: Former    Smokeless tobacco: Never   Tobacco comments:    stopped in the 1950's  Vaping Use   Vaping status: Never Used  Substance and Sexual Activity   Alcohol use: Yes    Alcohol/week: 0.0 standard drinks of alcohol    Comment: occ   Drug use: No   Sexual activity: Not on file  Other Topics Concern   Not on file  Social History Narrative   He lives in [place] with his wife.  He does not smoke.He drives about two hours a day which increases his risk for DVT.  He is retired.  He is a Agricultural consultant at [employer].  Social Drivers of Corporate investment banker Strain: Low Risk  (09/19/2023)   Overall Financial Resource Strain (CARDIA)    Difficulty of Paying Living Expenses: Not hard at all  Food Insecurity: No Food Insecurity (09/19/2023)   Hunger Vital Sign    Worried About Running Out of Food in the Last Year: Never true    Ran Out of Food in the Last Year: Never true  Transportation Needs: No Transportation Needs (09/19/2023)   PRAPARE - Administrator, Civil Service (Medical): No    Lack of Transportation (Non-Medical): No  Physical Activity: Inactive (09/19/2023)   Exercise Vital Sign    Days of Exercise per Week: 0 days    Minutes of Exercise per Session: 0 min  Stress: No Stress Concern Present (09/19/2023)   [employer] of Occupational Health - Occupational Stress Questionnaire    Feeling of Stress: Not at all  Social Connections: Socially Isolated (09/19/2023)   Social Connection and Isolation Panel    Frequency of Communication with Friends and Family: More than three times a week    Frequency of Social Gatherings with Friends and Family: More than three times a week    Attends Religious Services: Never    Database administrator or Organizations: No    Attends Banker Meetings: Never    Marital Status: Widowed    Tobacco Counseling Counseling given: Not Answered Tobacco comments: stopped in the  1950's    Clinical Intake:  Pre-visit preparation completed: Yes  Pain : No/denies pain     BMI - recorded: 29.53 Nutritional Status: BMI 25 -29 Overweight Nutritional Risks: None Diabetes: No  Lab Results  Component Value Date   HGBA1C 5.7 (H) 11/26/2022   HGBA1C 5.6 09/23/2019     How often do you need to have someone help you when you read instructions, pamphlets, or other written materials from your doctor or pharmacy?: 1 - Never  Interpreter Needed?: No  Information entered by :: Rojelio Blush LPN   Activities of Daily Living     09/19/2023    3:54 PM  In your present state of health, do you have any difficulty performing the following activities:  Hearing? 0  Vision? 0  Difficulty concentrating or making decisions? 0  Walking or climbing stairs? 0  Dressing or bathing? 0  Doing errands, shopping? 0  Preparing Food and eating ? N  Using the Toilet? N  In the past six months, have you accidently leaked urine? Y  Comment No medical attention needed  Do you have problems with loss of bowel control? N  Managing your Medications? N  Managing your Finances? N  Housekeeping or managing your Housekeeping? N    Patient Care Team: Frann Mabel Mt, DO as PCP - General (Family Medicine) Tobb, Kardie, DO as PCP - Cardiology (Cardiology)  I have updated your Care Teams any recent Medical Services you may have received from other providers in the past year.     Assessment:   This is a routine wellness examination for [facility].  Hearing/Vision screen Hearing Screening - Comments:: Denies hearing difficulties   Vision Screening - Comments:: Wears rx glasses -Not up to date with routine eye exams. [facility]     Goals Addressed               This Visit's Progress     Stay alive (pt-stated)         Depression Screen  09/19/2023    3:49 PM 07/24/2023    1:01 PM 04/24/2023   11:05 AM 03/20/2022    1:10 PM 03/09/2021    1:51 PM 03/03/2020     2:16 PM  PHQ 2/9 Scores  PHQ - 2 Score 0 0 0 0 1 1  PHQ- 9 Score   0       Fall Risk     09/19/2023    3:56 PM 04/24/2023   11:05 AM 03/20/2022    1:07 PM 03/09/2021    1:50 PM 03/03/2020    2:14 PM  Fall Risk   Falls in the past year? 1 0 0  1  Number falls in past yr: 0 0 0 0 0  Injury with Fall? 1 0 0 0 1  Comment Back injury. No medical attention needed      Risk for fall due to : No Fall Risks  Impaired balance/gait  History of fall(s)  Follow up Falls evaluation completed Falls evaluation completed Falls evaluation completed Falls prevention discussed  Falls prevention discussed      Data saved with a previous flowsheet row definition    MEDICARE RISK AT HOME:  Medicare Risk at Home Any stairs in or around the home?: Yes If so, are there any without handrails?: No Home free of loose throw rugs in walkways, pet beds, electrical cords, etc?: Yes Adequate lighting in your home to reduce risk of falls?: Yes Life alert?: Yes Use of a cane, walker or w/c?: No Grab bars in the bathroom?: Yes Shower chair or bench in shower?: Yes Elevated toilet seat or a handicapped toilet?: Yes  TIMED UP AND GO:  Was the test performed?  No  Cognitive Function: 6CIT completed        09/19/2023    3:58 PM 03/20/2022    1:21 PM  6CIT Screen  What Year? 0 points 0 points  What month? 0 points 0 points  What time? 0 points 0 points  Count back from 20 0 points 0 points  Months in reverse 0 points 2 points  Repeat phrase 0 points 0 points  Total Score 0 points 2 points    Immunizations Immunization History  Administered Date(s) Administered   Fluad  Trivalent(High Dose 65+) 10/11/2022   INFLUENZA, HIGH DOSE SEASONAL PF 10/31/2016, 09/27/2017, 09/28/2017, 10/04/2018, 10/23/2021   Influenza-Unspecified 11/07/2019, 11/27/2020   PFIZER(Purple Top)SARS-COV-2 Vaccination 02/13/2019, 03/10/2019, 10/06/2019   Pfizer Covid-19 Vaccine Bivalent Booster 63yrs & up 01/12/2021    Pfizer(Comirnaty )Fall Seasonal Vaccine 12 years and older 10/11/2022   Pneumococcal Conjugate-13 12/07/2015   Pneumococcal Polysaccharide-23 10/31/2016   Respiratory Syncytial Virus Vaccine ,Recomb Aduvanted(Arexvy ) 11/19/2022   Td 01/09/2004   Tdap 11/16/2016    Screening Tests Health Maintenance  Topic Date Due   Zoster Vaccines- Shingrix (1 of 2) Never done   Influenza Vaccine  08/09/2023   COVID-19 Vaccine (6 - Pfizer risk 2024-25 season) 09/09/2023   Medicare Annual Wellness (AWV)  09/18/2024   DTaP/Tdap/Td (3 - Td or Tdap) 11/17/2026   Pneumococcal Vaccine: 50+ Years  Completed   HPV VACCINES  Aged Out   Meningococcal B Vaccine  Aged Out    Health Maintenance Items Addressed:   Additional Screening:  Vision Screening: Recommended annual ophthalmology exams for early detection of glaucoma and other disorders of the eye. Is the patient up to date with their annual eye exam?  No  Who is the provider or what is the name of the office in which the patient attends annual  eye exams? [facility]  Dental Screening: Recommended annual dental exams for proper oral hygiene  Community Resource Referral / Chronic Care Management: CRR required this visit?  No   CCM required this visit?  No   Plan:    I have personally reviewed and noted the following in the patient's chart:   Medical and social history Use of alcohol, tobacco or illicit drugs  Current medications and supplements including opioid prescriptions. Patient is not currently taking opioid prescriptions. Functional ability and status Nutritional status Physical activity Advanced directives List of other physicians Hospitalizations, surgeries, and ER visits in previous 12 months Vitals Screenings to include cognitive, depression, and falls Referrals and appointments  In addition, I have reviewed and discussed with patient certain preventive protocols, quality metrics, and best practice recommendations.  A written personalized care plan for preventive services as well as general preventive health recommendations were provided to patient.   Rojelio LELON Blush, LPN   [id number]   After Visit Summary: (MyChart) Due to this being a telephonic visit, the after visit summary with patients personalized plan was offered to patient via MyChart   Notes: Nothing significant to report at this time.

## 2023-09-19 NOTE — Patient Instructions (Addendum)
 Luke [name],  Thank you for taking the time for your Medicare Wellness Visit. I appreciate your continued commitment to your health goals. Please review the care plan we discussed, and feel free to reach out if I can assist you further.  Medicare recommends these wellness visits once per year to help you and your care team stay ahead of potential health issues. These visits are designed to focus on prevention, allowing your provider to concentrate on managing your acute and chronic conditions during your regular appointments.  Please note that Annual Wellness Visits do not include a physical exam. Some assessments may be limited, especially if the visit was conducted virtually. If needed, we may recommend a separate in-person follow-up with your provider.  Ongoing Care Seeing your primary care provider every 3 to 6 months helps us  monitor your health and provide consistent, personalized care.   Referrals If a referral was made during today's visit and you haven't received any updates within two weeks, please contact the referred provider directly to check on the status.  Recommended Screenings:  Health Maintenance  Topic Date Due   Zoster (Shingles) Vaccine (1 of 2) Never done   Flu Shot  08/09/2023   COVID-19 Vaccine (6 - Pfizer risk 2024-25 season) 09/09/2023   Medicare Annual Wellness Visit  09/18/2024   DTaP/Tdap/Td vaccine (3 - Td or Tdap) 11/17/2026   Pneumococcal Vaccine for age over 49  Completed   HPV Vaccine  Aged Out   Meningitis B Vaccine  Aged Out       09/19/2023    3:58 PM  Advanced Directives  Does Patient Have a Medical Advance Directive? Yes  Type of Estate agent of [place];Living will  Copy of Healthcare Power of Attorney in Chart? No - copy requested   Advance Care Planning is important because it: Ensures you receive medical care that aligns with your values, goals, and preferences. Provides guidance to your family and loved ones,  reducing the emotional burden of decision-making during critical moments.  Vision: Annual vision screenings are recommended for early detection of glaucoma, cataracts, and diabetic retinopathy. These exams can also reveal signs of chronic conditions such as diabetes and high blood pressure.  Dental: Annual dental screenings help detect early signs of oral cancer, gum disease, and other conditions linked to overall health, including heart disease and diabetes.  Please see the attached documents for additional preventive care recommendations.

## 2023-09-23 ENCOUNTER — Other Ambulatory Visit (HOSPITAL_COMMUNITY): Payer: Self-pay

## 2023-09-26 ENCOUNTER — Ambulatory Visit: Admitting: Cardiology

## 2023-11-11 ENCOUNTER — Telehealth (HOSPITAL_BASED_OUTPATIENT_CLINIC_OR_DEPARTMENT_OTHER): Payer: Self-pay

## 2023-11-12 ENCOUNTER — Telehealth: Payer: Self-pay | Admitting: Cardiology

## 2023-11-12 NOTE — Telephone Encounter (Signed)
 Patient says he is returning a call received this morning, but no one left him a message. Please advise.

## 2023-11-15 ENCOUNTER — Ambulatory Visit (HOSPITAL_BASED_OUTPATIENT_CLINIC_OR_DEPARTMENT_OTHER)
Admission: RE | Admit: 2023-11-15 | Discharge: 2023-11-15 | Disposition: A | Discharge status: Home / Self Care | Source: Ambulatory Visit | Attending: Medical Oncology | Admitting: Medical Oncology

## 2023-11-15 DIAGNOSIS — D6859 Other primary thrombophilia: Secondary | ICD-10-CM | POA: Insufficient documentation

## 2023-11-15 DIAGNOSIS — I82402 Acute embolism and thrombosis of unspecified deep veins of left lower extremity: Secondary | ICD-10-CM | POA: Insufficient documentation

## 2023-11-18 ENCOUNTER — Ambulatory Visit: Payer: Self-pay | Admitting: Medical Oncology

## 2023-11-25 ENCOUNTER — Inpatient Hospital Stay: Admitting: Medical Oncology

## 2023-11-25 ENCOUNTER — Other Ambulatory Visit: Payer: Self-pay | Admitting: Medical Oncology

## 2023-11-25 ENCOUNTER — Encounter: Payer: Self-pay | Admitting: Medical Oncology

## 2023-11-25 ENCOUNTER — Inpatient Hospital Stay: Attending: Family

## 2023-11-25 ENCOUNTER — Other Ambulatory Visit (HOSPITAL_BASED_OUTPATIENT_CLINIC_OR_DEPARTMENT_OTHER): Payer: Self-pay

## 2023-11-25 VITALS — BP 123/49 | HR 54 | Temp 98.2°F | Resp 18 | Wt 209.8 lb

## 2023-11-25 DIAGNOSIS — D6859 Other primary thrombophilia: Secondary | ICD-10-CM

## 2023-11-25 DIAGNOSIS — D649 Anemia, unspecified: Secondary | ICD-10-CM | POA: Diagnosis not present

## 2023-11-25 DIAGNOSIS — D6862 Lupus anticoagulant syndrome: Secondary | ICD-10-CM | POA: Insufficient documentation

## 2023-11-25 DIAGNOSIS — Z86718 Personal history of other venous thrombosis and embolism: Secondary | ICD-10-CM | POA: Insufficient documentation

## 2023-11-25 DIAGNOSIS — Z79899 Other long term (current) drug therapy: Secondary | ICD-10-CM | POA: Insufficient documentation

## 2023-11-25 DIAGNOSIS — Z7901 Long term (current) use of anticoagulants: Secondary | ICD-10-CM | POA: Diagnosis not present

## 2023-11-25 DIAGNOSIS — Z23 Encounter for immunization: Secondary | ICD-10-CM | POA: Diagnosis not present

## 2023-11-25 LAB — CBC
HCT: 39.9 % (ref 39.0–52.0)
Hemoglobin: 13.3 g/dL (ref 13.0–17.0)
MCH: 33.2 pg (ref 26.0–34.0)
MCHC: 33.3 g/dL (ref 30.0–36.0)
MCV: 99.5 fL (ref 80.0–100.0)
Platelets: 172 K/uL (ref 150–400)
RBC: 4.01 MIL/uL — ABNORMAL LOW (ref 4.22–5.81)
RDW: 12.8 % (ref 11.5–15.5)
WBC: 6.9 K/uL (ref 4.0–10.5)
nRBC: 0 % (ref 0.0–0.2)

## 2023-11-25 LAB — CMP (CANCER CENTER ONLY)
ALT: 14 U/L (ref 0–44)
AST: 19 U/L (ref 15–41)
Albumin: 4 g/dL (ref 3.5–5.0)
Alkaline Phosphatase: 47 U/L (ref 38–126)
Anion gap: 11 (ref 5–15)
BUN: 27 mg/dL — ABNORMAL HIGH (ref 8–23)
CO2: 23 mmol/L (ref 22–32)
Calcium: 9.3 mg/dL (ref 8.9–10.3)
Chloride: 113 mmol/L — ABNORMAL HIGH (ref 98–111)
Creatinine: 1.2 mg/dL (ref 0.61–1.24)
GFR, Estimated: 57 mL/min — ABNORMAL LOW (ref >60)
Glucose, Bld: 92 mg/dL (ref 70–99)
Potassium: 4.9 mmol/L (ref 3.5–5.1)
Sodium: 146 mmol/L — ABNORMAL HIGH (ref 135–145)
Total Bilirubin: 0.3 mg/dL (ref 0.0–1.2)
Total Protein: 6.8 g/dL (ref 6.5–8.1)

## 2023-11-25 MED ORDER — AREXVY 120 MCG/0.5ML IM SUSR
INTRAMUSCULAR | 0 refills | Status: DC
  Filled 2023-11-25: qty 0.5, 1d supply, fill #0

## 2023-11-25 MED ORDER — FLUZONE HIGH-DOSE 0.5 ML IM SUSY
0.5000 mL | PREFILLED_SYRINGE | Freq: Once | INTRAMUSCULAR | 0 refills | Status: AC
  Filled 2023-11-25: qty 0.5, 1d supply, fill #0

## 2023-11-25 NOTE — Progress Notes (Signed)
 Hematology and Oncology Follow Up Visit  Luke [name] [id number] Oct 31, 1930 89 y.o. 11/25/2023  Past Medical History:  Diagnosis Date   Abnormal EKG    Atrial fibrillation (HCC)    ATRIAL FIBRILLATION, HX OF 04/16/2008   Qualifier: Diagnosis of  By: Justina Kos     CAD (coronary artery disease)     5 with the left internal mamary to the left anterior  descending coronary artery.   Chest pain 07/18/2016   Coronary artery disease involving native coronary artery of native heart with unstable angina pectoris (HCC)    DEEP VENOUS THROMBOPHLEBITIS, HX OF 04/16/2008   Qualifier: Diagnosis of  By: Justina Kos     DVT (deep venous thrombosis) (HCC) 02/06/2019   DVT femoral (deep venous thrombosis) with thrombophlebitis (HCC)    Dyslipidemia    Dyslipidemia, goal LDL below 70 04/16/2008   Qualifier: Diagnosis of  By: Justina Kos     HTN (hypertension)    Hx of CABG    Hyperlipidemia 01/30/2012   Hypertension 04/16/2008   Qualifier: Diagnosis of  By: Justina Kos     Open wound of right hand with tendon involvement 11/18/2016   Osteoarthritis 01/30/2012   Pseudophakia of right eye 12/18/2011   Formatting of this note might be different from the original. S/p Phaco/IOL OS with Alcon SN60WF 19.0D, wound zero, on 02/11/2012   Unstable angina (HCC)     Principle Diagnosis:  New RLE DVT dx while on Eliquis  - now on Xarelto  20 mg PO daily  History of acute LLE, unprovoked   2016: hx of left lower extremity DVT, treated with warfarin x ~6 months   01/2019: extensive occlusive and non-occlusive DVT from mid-femoral to popliteal and peroneal veins in the left lower extremity. On Eliquis  5mg  BID  Positive Lupus Anticoagulant   Current Therapy:   Xarelto  20 mg PO daily > changing to Pradaxa  07/24/2023  PriorTherapy:   Coumadin- DVT on coumadin  Eliquis - DVT on Eliquis     Interim History:  Mr. Vallez is back for follow-up for anticoagulation management:  He states that he has been well.   Other than that he is tolerating his Xarelto  well. He has not missed any doses. He is able to afford this medication. The edema and calf pain from his latest clot has resolved.   There has been no bleeding to his knowledge: denies epistaxis, gingivitis, hemoptysis, hematemesis, hematuria, melena, excessive bruising, blood donation.   He denies chest pains, SOB.   Wt Readings from Last 3 Encounters:  11/25/23 209 lb 12.8 oz (95.2 kg)  09/19/23 200 lb (90.7 kg)  07/24/23 210 lb 1.9 oz (95.3 kg)     Medications:   Current Outpatient Medications:    Alirocumab  (PRALUENT ) 75 MG/ML SOAJ, Inject 1 mL (75 mg total) into the skin every 14 (fourteen) days., Disp: 6 mL, Rfl: 3   amLODipine  (NORVASC ) 5 MG tablet, Take 1 tablet (5 mg total) by mouth daily., Disp: 90 tablet, Rfl: 3   aspirin  81 MG tablet, Take 81 mg by mouth daily., Disp: , Rfl:    atorvastatin  (LIPITOR) 20 MG tablet, Take 20 mg by mouth daily., Disp: 90 tablet, Rfl: 1   benazepril  (LOTENSIN ) 40 MG tablet, Take 1 tablet (40 mg total) by mouth daily., Disp: 90 tablet, Rfl: 3   Coenzyme Q10 (COQ10) 200 MG CAPS, Take 100 mg by mouth daily., Disp: , Rfl:    dabigatran  (PRADAXA ) 150 MG CAPS capsule, Take 1 capsule (150 mg total) by  mouth 2 (two) times daily., Disp: 60 capsule, Rfl: 6   KRILL OIL PO, Take 500 mg by mouth daily. Krill + Omega3, Disp: , Rfl:    metoprolol  tartrate (LOPRESSOR ) 25 MG tablet, Take 1 tablet (25 mg total) by mouth 2 (two) times daily., Disp: 180 tablet, Rfl: 2   Multiple Vitamins-Minerals (CENTRUM SILVER PO), Take 1 tablet by mouth daily. Unknown strength, Disp: , Rfl:   Allergies:  Allergies  Allergen Reactions   Statins Other (See Comments)    Muscles Cramping    Past Medical History, Surgical history, Social history, and Family History were reviewed and updated.  Review of Systems: Review of Systems  Constitutional: Negative.   HENT:  Negative.    Eyes: Negative.   Respiratory: Negative.     Cardiovascular: Negative.   Gastrointestinal: Negative.   Endocrine: Negative.   Genitourinary: Negative.    Musculoskeletal:  Positive for arthralgias.  Skin: Negative.   Neurological: Negative.   Hematological: Negative.   Psychiatric/Behavioral: Negative.       Physical Exam:  weight is 209 lb 12.8 oz (95.2 kg). His oral temperature is 98.2 F (36.8 C). His blood pressure is 123/49 (abnormal) and his pulse is 54 (abnormal). His respiration is 18 and oxygen saturation is 99%.   Physical Exam General: NAD Cardiovascular: regular rate and rhythm Pulmonary: clear ant fields Abdomen: soft, nontender, + bowel sounds GU: no suprapubic tenderness Extremities: no edema, no joint deformities Skin: no rashes Neurological: Weakness but otherwise nonfocal   Lab Results  Component Value Date   WBC 6.9 11/25/2023   HGB 13.3 11/25/2023   HCT 39.9 11/25/2023   MCV 99.5 11/25/2023   PLT 172 11/25/2023     Chemistry      Component Value Date/Time   NA 146 (H) 11/25/2023 1126   NA 146 (H) 11/26/2022 0901   K 4.9 11/25/2023 1126   CL 113 (H) 11/25/2023 1126   CO2 23 11/25/2023 1126   BUN 27 (H) 11/25/2023 1126   BUN 26 11/26/2022 0901   CREATININE 1.20 11/25/2023 1126   CREATININE 1.14 (H) 02/28/2015 1541      Component Value Date/Time   CALCIUM  9.3 11/25/2023 1126   ALKPHOS 47 11/25/2023 1126   AST 19 11/25/2023 1126   ALT 14 11/25/2023 1126   BILITOT 0.3 11/25/2023 1126     Encounter Diagnoses  Name Primary?   Primary hypercoagulable state Yes   Anemia, unspecified type     Assessment and Plan- Patient is a 89 y.o. 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   Given his positive lupus anticoagulant and slightly elevated anticardiolipin IgM levels and lack of resolution of his DVTwe switched him to Pradaxa  from Xarelto . He has done well with this and has no concerns.   CBC shows  mild anemia which is slightly improved- possibly secondary to large blood volume work up from [employer]. Given his labs and history we will screen for additional causes at follow up. He will maintain a healthy balanced diet.     Disposition: RTC 2 months lab only and then APP visit 3-7 days later    Lauraine Dais PA-C 11/17/202512:43 PM

## 2023-12-17 ENCOUNTER — Encounter: Payer: Self-pay | Admitting: Cardiology

## 2023-12-17 ENCOUNTER — Other Ambulatory Visit (HOSPITAL_BASED_OUTPATIENT_CLINIC_OR_DEPARTMENT_OTHER): Payer: Self-pay

## 2023-12-17 ENCOUNTER — Other Ambulatory Visit: Payer: Self-pay

## 2023-12-17 ENCOUNTER — Ambulatory Visit: Attending: Cardiology | Admitting: Cardiology

## 2023-12-17 VITALS — BP 140/70 | HR 59 | Ht 69.0 in | Wt 208.0 lb

## 2023-12-17 DIAGNOSIS — I10 Essential (primary) hypertension: Secondary | ICD-10-CM

## 2023-12-17 DIAGNOSIS — E785 Hyperlipidemia, unspecified: Secondary | ICD-10-CM

## 2023-12-17 DIAGNOSIS — I82402 Acute embolism and thrombosis of unspecified deep veins of left lower extremity: Secondary | ICD-10-CM

## 2023-12-17 DIAGNOSIS — Z951 Presence of aortocoronary bypass graft: Secondary | ICD-10-CM | POA: Diagnosis not present

## 2023-12-17 DIAGNOSIS — I25110 Atherosclerotic heart disease of native coronary artery with unstable angina pectoris: Secondary | ICD-10-CM | POA: Diagnosis not present

## 2023-12-17 DIAGNOSIS — D6859 Other primary thrombophilia: Secondary | ICD-10-CM

## 2023-12-17 DIAGNOSIS — I480 Paroxysmal atrial fibrillation: Secondary | ICD-10-CM | POA: Diagnosis not present

## 2023-12-17 MED ORDER — AMLODIPINE BESYLATE 5 MG PO TABS
5.0000 mg | ORAL_TABLET | Freq: Every day | ORAL | 3 refills | Status: AC
  Filled 2023-12-17: qty 90, 90d supply, fill #0

## 2023-12-17 MED ORDER — DABIGATRAN ETEXILATE MESYLATE 150 MG PO CAPS
150.0000 mg | ORAL_CAPSULE | Freq: Two times a day (BID) | ORAL | 11 refills | Status: DC
  Filled 2024-01-13: qty 60, 30d supply, fill #0
  Filled 2024-02-12: qty 60, 30d supply, fill #1

## 2023-12-17 MED ORDER — ATORVASTATIN CALCIUM 20 MG PO TABS
20.0000 mg | ORAL_TABLET | Freq: Every day | ORAL | 3 refills | Status: AC
  Filled 2023-12-17: qty 90, 90d supply, fill #0

## 2023-12-17 MED ORDER — BENAZEPRIL HCL 40 MG PO TABS: 40.0000 mg | ORAL_TABLET | Freq: Every day | ORAL | 3 refills | Status: AC

## 2023-12-17 MED ORDER — METOPROLOL TARTRATE 25 MG PO TABS: 25.0000 mg | ORAL_TABLET | Freq: Two times a day (BID) | ORAL | 3 refills | Status: AC

## 2023-12-17 NOTE — Progress Notes (Unsigned)
 Cardiology Office Note:    Date:  12/17/2023   ID:  Luke CHERVENAK, DOB 07/28/1930, MRN [id number]  PCP:  Luke Mabel Mt, DO  Cardiologist:  Luke Fitch, MD    Referring MD: Luke [name]*   Chief Complaint  Patient presents with   Follow-up    History of Present Illness:    Luke [name] is a 89 y.o. male past medical history significant for coronary artery disease, status post coronary bypass graft done many years ago last cardiac catheterization 2019 showing all grafts being patent, paroxysmal atrial fibrillation, DVT, he was on Eliquis  then switched to Xarelto  then now in Pradaxa  intolerance to statin however now on PCSK9 agent Praluent .  Recently he ended up having DVT of lower extremities he was switched from Eliquis  to Pradaxa .  Comes today to my office for follow-up cardiac wise doing well.  Denies have any chest pain tightness squeezing pressure burning chest.  Does what he wants to do however admits that he slowed down somewhat.  He also complained of having balance issue  Past Medical History:  Diagnosis Date   Abnormal EKG    Atrial fibrillation (HCC)    ATRIAL FIBRILLATION, HX OF 04/16/2008   Qualifier: Diagnosis of  By: Luke [name]     CAD (coronary artery disease)     5 with the left internal mamary to the left anterior  descending coronary artery.   Chest pain 07/18/2016   Coronary artery disease involving native coronary artery of native heart with unstable angina pectoris (HCC)    DEEP VENOUS THROMBOPHLEBITIS, HX OF 04/16/2008   Qualifier: Diagnosis of  By: Luke [name]     DVT (deep venous thrombosis) (HCC) 02/06/2019   DVT femoral (deep venous thrombosis) with thrombophlebitis (HCC)    Dyslipidemia    Dyslipidemia, goal LDL below 70 04/16/2008   Qualifier: Diagnosis of  By: Luke, [name]     HTN (hypertension)    Hx of CABG    Hyperlipidemia 01/30/2012   Hypertension 04/16/2008   Qualifier: Diagnosis of  By: Luke, [name]     Open  wound of right hand with tendon involvement 11/18/2016   Osteoarthritis 01/30/2012   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 S/p Phaco/IOL OS with Alcon SN60WF 19.0D, wound zero, on 02/11/2012   Unstable angina [facility])     Past Surgical History:  Procedure Laterality Date   CORONARY ARTERY BYPASS GRAFT  2001   I & D EXTREMITY Right 11/18/2016   Procedure: IRRIGATION AND DEBRIDEMENT RIGHT LONG AND RING FINGER;  Surgeon: Luke Kemps, MD;  Location: [facility] OR;  Service: Orthopedics;  Laterality: Right;   LEFT HEART CATH AND CORS/GRAFTS ANGIOGRAPHY N/A 07/19/2016   Procedure: Left Heart Cath and Cors/Grafts Angiography;  Surgeon: Luke Candyce RAMAN, MD;  Location: [facility] INVASIVE CV LAB;  Service: Cardiovascular;  Laterality: N/A;    Current Medications: Current Meds  Medication Sig   Alirocumab  (PRALUENT ) 75 MG/ML SOAJ Inject 1 mL (75 mg total) into the skin every 14 (fourteen) days.   amLODipine  (NORVASC ) 5 MG tablet Take 1 tablet (5 mg total) by mouth daily.   aspirin  81 MG tablet Take 81 mg by mouth daily.   atorvastatin  (LIPITOR) 20 MG tablet Take 20 mg by mouth daily.   benazepril  (LOTENSIN ) 40 MG tablet Take 1 tablet (40 mg total) by mouth daily.   Coenzyme Q10 (COQ10) 200 MG CAPS Take 100 mg by mouth daily.  dabigatran  (PRADAXA ) 150 MG CAPS capsule Take 1 capsule (150 mg total) by mouth 2 (two) times daily.   KRILL OIL PO Take 500 mg by mouth daily. Krill + Omega3   metoprolol  tartrate (LOPRESSOR ) 25 MG tablet Take 1 tablet (25 mg total) by mouth 2 (two) times daily.   Multiple Vitamins-Minerals (CENTRUM SILVER PO) Take 1 tablet by mouth daily. Unknown strength     Allergies:   Statins   Social History   Socioeconomic History   Marital status: Widowed    Spouse name: Not on file   Number of children: Not on file   Years of education: Not on file   Highest education level: Not on file  Occupational History   Not on file   Tobacco Use   Smoking status: Former   Smokeless tobacco: Never   Tobacco comments:    stopped in the 1950's  Vaping Use   Vaping status: Never Used  Substance and Sexual Activity   Alcohol use: Yes    Alcohol/week: 0.0 standard drinks of alcohol    Comment: occ   Drug use: No   Sexual activity: Not on file  Other Topics Concern   Not on file  Social History Narrative   He lives in [place] with his wife.  He does not smoke.He drives about two hours a day which increases his risk for DVT.  He is retired.  He is a agricultural consultant at [employer].          Social Drivers of Corporate Investment Banker Strain: Low Risk  (09/19/2023)   Overall Financial Resource Strain (CARDIA)    Difficulty of Paying Living Expenses: Not hard at all  Food Insecurity: No Food Insecurity (09/19/2023)   Hunger Vital Sign    Worried About Running Out of Food in the Last Year: Never true    Ran Out of Food in the Last Year: Never true  Transportation Needs: No Transportation Needs (09/19/2023)   PRAPARE - Administrator, Civil Service (Medical): No    Lack of Transportation (Non-Medical): No  Physical Activity: Inactive (09/19/2023)   Exercise Vital Sign    Days of Exercise per Week: 0 days    Minutes of Exercise per Session: 0 min  Stress: No Stress Concern Present (09/19/2023)   [employer] of Occupational Health - Occupational Stress Questionnaire    Feeling of Stress: Not at all  Social Connections: Socially Isolated (09/19/2023)   Social Connection and Isolation Panel    Frequency of Communication with Friends and Family: More than three times a week    Frequency of Social Gatherings with Friends and Family: More than three times a week    Attends Religious Services: Never    Database Administrator or Organizations: No    Attends Banker Meetings: Never    Marital Status: Widowed     Family History: The patient's family history includes Cancer  in his mother; Coronary artery disease in an other family member; Heart attack in his father; Heart disease in his father and mother. ROS:   Please see the history of present illness.    All 14 point review of systems negative except as described per history of present illness  EKGs/Labs/Other Studies Reviewed:         Recent Labs: 11/25/2023: ALT 14; BUN 27; Creatinine 1.20; Hemoglobin 13.3; Platelets 172; Potassium 4.9; Sodium 146  Recent Lipid Panel    Component Value Date/Time  CHOL 90 (L) 11/19/2022 0954   TRIG 130 11/19/2022 0954   TRIG 89 11/15/2005 0820   HDL 44 11/19/2022 0954   CHOLHDL 2.0 11/19/2022 0954   CHOLHDL 4.2 02/28/2015 1541   VLDL 23 02/28/2015 1541   LDLCALC 23 11/19/2022 0954   LDLDIRECT 81 01/27/2021 1106   LDLDIRECT 139.9 06/18/2006 1008    Physical Exam:    VS:  BP (!) 140/70   Pulse (!) 59   Ht 5' 9 (1.753 m)   Wt 208 lb (94.3 kg)   SpO2 97%   BMI 30.72 kg/m     Wt Readings from Last 3 Encounters:  12/17/23 208 lb (94.3 kg)  11/25/23 209 lb 12.8 oz (95.2 kg)  09/19/23 200 lb (90.7 kg)     GEN:  Well nourished, well developed in no acute distress HEENT: Normal NECK: No JVD; No carotid bruits LYMPHATICS: No lymphadenopathy CARDIAC: RRR, no murmurs, no rubs, no gallops RESPIRATORY:  Clear to auscultation without rales, wheezing or rhonchi  ABDOMEN: Soft, non-tender, non-distended MUSCULOSKELETAL:  No edema; No deformity  SKIN: Warm and dry LOWER EXTREMITIES: no swelling NEUROLOGIC:  Alert and oriented x 3 PSYCHIATRIC:  Normal affect   ASSESSMENT:    1. Paroxysmal atrial fibrillation (HCC)   2. Primary hypercoagulable state   3. Acute deep vein thrombosis (DVT) of left lower extremity, unspecified vein (HCC)   4. Coronary artery disease involving native coronary artery of native heart with unstable angina pectoris (HCC)   5. Primary hypertension   6. Hx of CABG    PLAN:    In order of problems listed above:  Paroxysmal  atrial fibrillation, seems to maintain sinus rhythm continue anticoagulation. Coronary artery disease stable no signs and symptoms of reactivation of the problem. Essential hypertension blood pressure well-controlled continue present management. Dyslipidemia I did review KPN from 11/19/2022 showing LDL of 23 LDL HDL 44.  Will recheck fasting lipid profile   Medication Adjustments/Labs and Tests Ordered: Current medicines are reviewed at length with the patient today.  Concerns regarding medicines are outlined above.  No orders of the defined types were placed in this encounter.  Medication changes: No orders of the defined types were placed in this encounter.   Signed, Luke DOROTHA Fitch, MD, [facility] 12/17/2023 11:17 AM    [facility]

## 2023-12-17 NOTE — Patient Instructions (Addendum)
Medication Instructions:  Your physician recommends that you continue on your current medications as directed. Please refer to the Current Medication list given to you today.  *If you need a refill on your cardiac medications before your next appointment, please call your pharmacy*   Lab Work: 3rd Floor   [address]  Your physician recommends that you return for lab work in:  when fasting You need to have labs done when you are fasting.  You can come Monday through Friday 8:00 am to 11:30AM and 1:00 to 4:00. You do not need to make an appointment as the order has already been placed.     Testing/Procedures: None Ordered   Follow-Up: At [facility], you and your health needs are our priority.  As part of our continuing mission to provide you with exceptional heart care, we have created designated Provider Care Teams.  These Care Teams include your primary Cardiologist (physician) and Advanced Practice Providers (APPs -  Physician Assistants and Nurse Practitioners) who all work together to provide you with the care you need, when you need it.  We recommend signing up for the patient portal called "MyChart".  Sign up information is provided on this After Visit Summary.  MyChart is used to connect with patients for Virtual Visits (Telemedicine).  Patients are able to view lab/test results, encounter notes, upcoming appointments, etc.  Non-urgent messages can be sent to your provider as well.   To learn more about what you can do with MyChart, go to [email or web address].    Your next appointment:   6 month(s)  The format for your next appointment:   In Person  Provider:   Gypsy Balsam, MD    Other Instructions NA

## 2023-12-23 DIAGNOSIS — E785 Hyperlipidemia, unspecified: Secondary | ICD-10-CM | POA: Diagnosis not present

## 2023-12-24 LAB — ALT: ALT: 13 IU/L (ref 0–44)

## 2023-12-24 LAB — LIPID PANEL
Chol/HDL Ratio: 2.7 ratio (ref 0.0–5.0)
Cholesterol, Total: 101 mg/dL (ref 100–199)
HDL: 38 mg/dL — ABNORMAL LOW (ref >39)
LDL Chol Calc (NIH): 45 mg/dL (ref 0–99)
Triglycerides: 95 mg/dL (ref 0–149)
VLDL Cholesterol Cal: 18 mg/dL (ref 5–40)

## 2023-12-24 LAB — AST: AST: 19 IU/L (ref 0–40)

## 2023-12-27 ENCOUNTER — Ambulatory Visit: Payer: Self-pay | Admitting: Cardiology

## 2024-01-13 ENCOUNTER — Other Ambulatory Visit: Payer: Self-pay

## 2024-01-13 ENCOUNTER — Other Ambulatory Visit (HOSPITAL_BASED_OUTPATIENT_CLINIC_OR_DEPARTMENT_OTHER): Payer: Self-pay

## 2024-02-03 ENCOUNTER — Inpatient Hospital Stay

## 2024-02-05 ENCOUNTER — Inpatient Hospital Stay

## 2024-02-07 ENCOUNTER — Inpatient Hospital Stay: Attending: Medical Oncology

## 2024-02-07 DIAGNOSIS — Z86718 Personal history of other venous thrombosis and embolism: Secondary | ICD-10-CM | POA: Insufficient documentation

## 2024-02-07 DIAGNOSIS — D649 Anemia, unspecified: Secondary | ICD-10-CM | POA: Insufficient documentation

## 2024-02-07 DIAGNOSIS — Z7901 Long term (current) use of anticoagulants: Secondary | ICD-10-CM | POA: Insufficient documentation

## 2024-02-07 DIAGNOSIS — D6862 Lupus anticoagulant syndrome: Secondary | ICD-10-CM | POA: Insufficient documentation

## 2024-02-07 DIAGNOSIS — Z79899 Other long term (current) drug therapy: Secondary | ICD-10-CM | POA: Diagnosis not present

## 2024-02-07 DIAGNOSIS — D6859 Other primary thrombophilia: Secondary | ICD-10-CM

## 2024-02-07 LAB — CMP (CANCER CENTER ONLY)
ALT: 15 U/L (ref 0–44)
AST: 18 U/L (ref 15–41)
Albumin: 4.1 g/dL (ref 3.5–5.0)
Alkaline Phosphatase: 47 U/L (ref 38–126)
Anion gap: 7 (ref 5–15)
BUN: 25 mg/dL — ABNORMAL HIGH (ref 8–23)
CO2: 26 mmol/L (ref 22–32)
Calcium: 9.3 mg/dL (ref 8.9–10.3)
Chloride: 113 mmol/L — ABNORMAL HIGH (ref 98–111)
Creatinine: 1.26 mg/dL — ABNORMAL HIGH (ref 0.61–1.24)
GFR, Estimated: 54 mL/min — ABNORMAL LOW
Glucose, Bld: 93 mg/dL (ref 70–99)
Potassium: 5.3 mmol/L — ABNORMAL HIGH (ref 3.5–5.1)
Sodium: 146 mmol/L — ABNORMAL HIGH (ref 135–145)
Total Bilirubin: 0.3 mg/dL (ref 0.0–1.2)
Total Protein: 6.5 g/dL (ref 6.5–8.1)

## 2024-02-07 LAB — CBC WITH DIFFERENTIAL (CANCER CENTER ONLY)
Abs Immature Granulocytes: 0.06 10*3/uL (ref 0.00–0.07)
Basophils Absolute: 0.1 10*3/uL (ref 0.0–0.1)
Basophils Relative: 1 %
Eosinophils Absolute: 0.3 10*3/uL (ref 0.0–0.5)
Eosinophils Relative: 5 %
HCT: 40.6 % (ref 39.0–52.0)
Hemoglobin: 13.5 g/dL (ref 13.0–17.0)
Immature Granulocytes: 1 %
Lymphocytes Relative: 28 %
Lymphs Abs: 1.5 10*3/uL (ref 0.7–4.0)
MCH: 33 pg (ref 26.0–34.0)
MCHC: 33.3 g/dL (ref 30.0–36.0)
MCV: 99.3 fL (ref 80.0–100.0)
Monocytes Absolute: 0.5 10*3/uL (ref 0.1–1.0)
Monocytes Relative: 10 %
Neutro Abs: 3 10*3/uL (ref 1.7–7.7)
Neutrophils Relative %: 55 %
Platelet Count: 160 10*3/uL (ref 150–400)
RBC: 4.09 MIL/uL — ABNORMAL LOW (ref 4.22–5.81)
RDW: 12.6 % (ref 11.5–15.5)
WBC Count: 5.4 10*3/uL (ref 4.0–10.5)
nRBC: 0 % (ref 0.0–0.2)

## 2024-02-07 LAB — VITAMIN B12: Vitamin B-12: 548 pg/mL (ref 180–914)

## 2024-02-07 LAB — IRON AND IRON BINDING CAPACITY (CC-WL,HP ONLY)
Iron: 107 ug/dL (ref 45–182)
Saturation Ratios: 35 % (ref 17.9–39.5)
TIBC: 304 ug/dL (ref 250–450)
UIBC: 197 ug/dL

## 2024-02-07 LAB — RETIC PANEL
Immature Retic Fract: 10.1 % (ref 2.3–15.9)
RBC.: 4.1 MIL/uL — ABNORMAL LOW (ref 4.22–5.81)
Retic Count, Absolute: 51.3 10*3/uL (ref 19.0–186.0)
Retic Ct Pct: 1.3 % (ref 0.4–3.1)
Reticulocyte Hemoglobin: 36 pg

## 2024-02-07 LAB — FOLATE: Folate: 19.1 ng/mL

## 2024-02-07 LAB — FERRITIN: Ferritin: 180 ng/mL (ref 24–336)

## 2024-02-08 LAB — ERYTHROPOIETIN: Erythropoietin: 10.4 m[IU]/mL (ref 2.6–18.5)

## 2024-02-10 ENCOUNTER — Inpatient Hospital Stay: Admitting: Medical Oncology

## 2024-02-12 ENCOUNTER — Inpatient Hospital Stay: Admitting: Medical Oncology

## 2024-02-12 ENCOUNTER — Other Ambulatory Visit: Payer: Self-pay

## 2024-02-12 ENCOUNTER — Other Ambulatory Visit (HOSPITAL_BASED_OUTPATIENT_CLINIC_OR_DEPARTMENT_OTHER): Payer: Self-pay

## 2024-02-12 ENCOUNTER — Ambulatory Visit: Payer: Self-pay | Admitting: Medical Oncology

## 2024-02-12 ENCOUNTER — Encounter: Payer: Self-pay | Admitting: Medical Oncology

## 2024-02-12 VITALS — BP 156/83 | HR 57 | Temp 98.4°F | Resp 19 | Ht 69.0 in | Wt 208.4 lb

## 2024-02-12 DIAGNOSIS — M2550 Pain in unspecified joint: Secondary | ICD-10-CM | POA: Diagnosis not present

## 2024-02-12 DIAGNOSIS — I82402 Acute embolism and thrombosis of unspecified deep veins of left lower extremity: Secondary | ICD-10-CM | POA: Diagnosis not present

## 2024-02-12 DIAGNOSIS — Z86718 Personal history of other venous thrombosis and embolism: Secondary | ICD-10-CM | POA: Diagnosis not present

## 2024-02-12 DIAGNOSIS — D649 Anemia, unspecified: Secondary | ICD-10-CM | POA: Diagnosis not present

## 2024-02-12 DIAGNOSIS — D6859 Other primary thrombophilia: Secondary | ICD-10-CM

## 2024-02-12 DIAGNOSIS — D631 Anemia in chronic kidney disease: Secondary | ICD-10-CM

## 2024-02-12 DIAGNOSIS — N1831 Chronic kidney disease, stage 3a: Secondary | ICD-10-CM

## 2024-02-12 MED ORDER — DABIGATRAN ETEXILATE MESYLATE 150 MG PO CAPS
150.0000 mg | ORAL_CAPSULE | Freq: Two times a day (BID) | ORAL | 3 refills | Status: AC
  Filled 2024-02-12: qty 180, 90d supply, fill #0

## 2024-02-12 NOTE — Progress Notes (Signed)
 " Hematology and Oncology Follow Up Visit  Luke [name] [id number] Dec 05, 1930 89 y.o. 02/12/2024  Past Medical History:  Diagnosis Date   Abnormal EKG    Atrial fibrillation (HCC)    ATRIAL FIBRILLATION, HX OF 04/16/2008   Qualifier: Diagnosis of  By: Jaramillo, Luz     CAD (coronary artery disease)     5 with the left internal mamary to the left anterior  descending coronary artery.   Chest pain 07/18/2016   Coronary artery disease involving native coronary artery of native heart with unstable angina pectoris (HCC)    DEEP VENOUS THROMBOPHLEBITIS, HX OF 04/16/2008   Qualifier: Diagnosis of  By: Justina Kos     DVT (deep venous thrombosis) (HCC) 02/06/2019   DVT femoral (deep venous thrombosis) with thrombophlebitis (HCC)    Dyslipidemia    Dyslipidemia, goal LDL below 70 04/16/2008   Qualifier: Diagnosis of  By: Justina Kos     HTN (hypertension)    Hx of CABG    Hyperlipidemia 01/30/2012   Hypertension 04/16/2008   Qualifier: Diagnosis of  By: Justina Kos     Open wound of right hand with tendon involvement 11/18/2016   Osteoarthritis 01/30/2012   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 S/p Phaco/IOL OS with Alcon SN60WF 19.0D, wound zero, on 02/11/2012   Unstable angina (HCC)     Principle Diagnosis:  New RLE DVT dx while on Eliquis  - now on Xarelto  20 mg PO daily  History of acute LLE, unprovoked   2016: hx of left lower extremity DVT, treated with warfarin x ~6 months   01/2019: extensive occlusive and non-occlusive DVT from mid-femoral to popliteal and peroneal veins in the left lower extremity. On Eliquis  5mg  BID  Positive Lupus Anticoagulant   Current Therapy:   Xarelto  20 mg PO daily - d/c 07/24/2023 due to concerns for potential failure   Pradaxa  07/24/2023  PriorTherapy:   Coumadin- DVT on coumadin  Eliquis - DVT on Eliquis     Interim History:  Luke [name] is back for follow-up for anticoagulation  management:  Today he states that he is well. He just saw his cardiologist. He got a good report.   He has been changes from Xarelto  to Pradaxa  due to concerns for potential failure of this medication. He is doing well on the Pradaxa  without concerns.   He has not missed any doses. He is able to afford this medication. The edema and calf pain from his latest clot has resolved.   There has been no bleeding to his knowledge: denies epistaxis, gingivitis, hemoptysis, hematemesis, hematuria, melena, excessive bruising, blood donation.   His only complaint is some chronic generalized joint pains.   He denies chest pains, SOB.   Wt Readings from Last 3 Encounters:  02/12/24 208 lb 6.4 oz (94.5 kg)  12/17/23 208 lb (94.3 kg)  11/25/23 209 lb 12.8 oz (95.2 kg)     Medications:   Current Outpatient Medications:    Alirocumab  (PRALUENT ) 75 MG/ML SOAJ, Inject 1 mL (75 mg total) into the skin every 14 (fourteen) days., Disp: 6 mL, Rfl: 3   amLODipine  (NORVASC ) 10 MG tablet, Take 10 mg by mouth daily., Disp: , Rfl:    amLODipine  (NORVASC ) 5 MG tablet, Take 1 tablet (5 mg total) by mouth daily., Disp: 90 tablet, Rfl: 3   aspirin  81 MG tablet, Take 81 mg by mouth daily., Disp: , Rfl:    atorvastatin  (LIPITOR) 20  MG tablet, Take 1 tablet (20 mg total) by mouth daily., Disp: 90 tablet, Rfl: 3   benazepril  (LOTENSIN ) 40 MG tablet, Take 1 tablet (40 mg total) by mouth daily., Disp: 90 tablet, Rfl: 3   benazepril  (LOTENSIN ) 40 MG tablet, Take 40 mg by mouth daily., Disp: , Rfl:    Coenzyme Q10 (COQ10) 200 MG CAPS, Take 100 mg by mouth daily., Disp: , Rfl:    dabigatran  (PRADAXA ) 150 MG CAPS capsule, Take 1 capsule (150 mg total) by mouth 2 (two) times daily., Disp: 180 capsule, Rfl: 3   KRILL OIL PO, Take 500 mg by mouth daily. Krill + Omega3, Disp: , Rfl:    metoprolol  tartrate (LOPRESSOR ) 25 MG tablet, Take 1 tablet (25 mg total) by mouth 2 (two) times daily., Disp: 180 tablet, Rfl: 3   Multiple  Vitamins-Minerals (CENTRUM SILVER PO), Take 1 tablet by mouth daily. Unknown strength, Disp: , Rfl:   Allergies:  Allergies  Allergen Reactions   Statins Other (See Comments)    Muscles Cramping    Past Medical History, Surgical history, Social history, and Family History were reviewed and updated.  Review of Systems: Review of Systems  Constitutional: Negative.   HENT:  Negative.    Eyes: Negative.   Respiratory: Negative.    Cardiovascular: Negative.   Gastrointestinal: Negative.   Endocrine: Negative.   Genitourinary: Negative.    Musculoskeletal:  Positive for arthralgias.  Skin: Negative.   Neurological: Negative.   Hematological: Negative.   Psychiatric/Behavioral: Negative.       Physical Exam:  height is 5' 9 (1.753 m) and weight is 208 lb 6.4 oz (94.5 kg). His oral temperature is 98.4 F (36.9 C). His blood pressure is 156/83 (abnormal) and his pulse is 57 (abnormal). His respiration is 19 and oxygen saturation is 95%.   Physical Exam General: NAD Cardiovascular: regular rate and rhythm Pulmonary: clear ant fields Abdomen: soft, nontender, + bowel sounds GU: no suprapubic tenderness Extremities: no edema, no joint deformities Skin: no rashes Neurological: Weakness but otherwise nonfocal   Lab Results  Component Value Date   WBC 5.4 02/07/2024   HGB 13.5 02/07/2024   HCT 40.6 02/07/2024   MCV 99.3 02/07/2024   PLT 160 02/07/2024     Chemistry      Component Value Date/Time   NA 146 (H) 02/07/2024 1402   NA 146 (H) 11/26/2022 0901   K 5.3 (H) 02/07/2024 1402   CL 113 (H) 02/07/2024 1402   CO2 26 02/07/2024 1402   BUN 25 (H) 02/07/2024 1402   BUN 26 11/26/2022 0901   CREATININE 1.26 (H) 02/07/2024 1402   CREATININE 1.14 (H) 02/28/2015 1541      Component Value Date/Time   CALCIUM  9.3 02/07/2024 1402   ALKPHOS 47 02/07/2024 1402   AST 18 02/07/2024 1402   ALT 15 02/07/2024 1402   BILITOT 0.3 02/07/2024 1402     Encounter Diagnoses  Name  Primary?   Primary hypercoagulable state Yes   Anemia, unspecified type    History of DVT (deep vein thrombosis)    Pain in joint, multiple sites    Anemia in stage 3a chronic kidney disease (HCC)    Acute deep vein thrombosis (DVT) of left lower extremity, unspecified vein (HCC)     Assessment and Plan- Patient is a 89 y.o. 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 He has stage 3a  CKD.   Given his positive lupus anticoagulant and slightly elevated anticardiolipin IgM levels and lack of resolution of his DVT, we switched him to Pradaxa  from Xarelto . He has done well with this and has no concerns.   At his last visit he had new anemia which we suspected was from hypercoag work up however we elevated to investigate further with additional nutritional labs. He had labs completed on 02/07/2024 which showed a normal folate level, lower EPO level of 10.4, normal retic count of 1.4, CMP showing some CKD and new hyperkalemia, his CBC showed a normal Hgb of 13.5, ferritin 180, iron saturation 35%, B12 548.   In the future should his Hgb fall below 11 consistently we could discuss ESA use though we would have to consider his CAD history. For now labs looks good.     Disposition: RTC 6 months lab only and then APP visit 3-7 days later    Lauraine Dais PA-C 2/4/20263:00 PM   "

## 2024-08-05 ENCOUNTER — Inpatient Hospital Stay

## 2024-08-12 ENCOUNTER — Inpatient Hospital Stay: Admitting: Medical Oncology

## 2024-10-06 ENCOUNTER — Ambulatory Visit
